# Patient Record
Sex: Male | Born: 1955 | Race: White | Hispanic: No | Marital: Single | Smoking: Never smoker
Health system: Southern US, Community
[De-identification: ages and names within clinical notes are randomized; demographics above are authoritative.]

## PROBLEM LIST (undated history)

## (undated) DIAGNOSIS — I10 Essential (primary) hypertension: Secondary | ICD-10-CM

## (undated) DIAGNOSIS — F79 Unspecified intellectual disabilities: Secondary | ICD-10-CM

## (undated) DIAGNOSIS — R569 Unspecified convulsions: Secondary | ICD-10-CM

---

## 2003-11-08 ENCOUNTER — Emergency Department: Payer: Self-pay | Admitting: Unknown Physician Specialty

## 2008-08-28 ENCOUNTER — Emergency Department: Payer: Self-pay | Admitting: Emergency Medicine

## 2011-07-24 ENCOUNTER — Emergency Department: Payer: Self-pay | Admitting: Emergency Medicine

## 2013-09-07 DIAGNOSIS — G40409 Other generalized epilepsy and epileptic syndromes, not intractable, without status epilepticus: Secondary | ICD-10-CM | POA: Diagnosis present

## 2013-09-07 DIAGNOSIS — I10 Essential (primary) hypertension: Secondary | ICD-10-CM | POA: Diagnosis present

## 2015-10-12 ENCOUNTER — Other Ambulatory Visit
Admission: RE | Admit: 2015-10-12 | Discharge: 2015-10-12 | Disposition: A | Discharge status: Home / Self Care | Payer: Medicare Other | Source: Ambulatory Visit | Attending: Physician Assistant | Admitting: Physician Assistant

## 2015-10-12 ENCOUNTER — Emergency Department
Admission: EM | Admit: 2015-10-12 | Discharge: 2015-10-12 | Disposition: A | Discharge status: Home / Self Care | Payer: Medicare Other | Attending: Emergency Medicine | Admitting: Emergency Medicine

## 2015-10-12 DIAGNOSIS — I10 Essential (primary) hypertension: Secondary | ICD-10-CM | POA: Diagnosis not present

## 2015-10-12 DIAGNOSIS — R079 Chest pain, unspecified: Secondary | ICD-10-CM | POA: Diagnosis present

## 2015-10-12 DIAGNOSIS — K219 Gastro-esophageal reflux disease without esophagitis: Secondary | ICD-10-CM

## 2015-10-12 DIAGNOSIS — R0789 Other chest pain: Secondary | ICD-10-CM | POA: Diagnosis present

## 2015-10-12 LAB — BASIC METABOLIC PANEL
Anion gap: 8 (ref 5–15)
BUN: 11 mg/dL (ref 6–20)
CALCIUM: 8.8 mg/dL — AB (ref 8.9–10.3)
CO2: 25 mmol/L (ref 22–32)
CREATININE: 0.7 mg/dL (ref 0.61–1.24)
Chloride: 100 mmol/L — ABNORMAL LOW (ref 101–111)
GFR calc Af Amer: >60 mL/min (ref >60)
GFR calc non Af Amer: >60 mL/min (ref >60)
GLUCOSE: 133 mg/dL — AB (ref 65–99)
Potassium: 3.5 mmol/L (ref 3.5–5.1)
Sodium: 133 mmol/L — ABNORMAL LOW (ref 135–145)

## 2015-10-12 LAB — CBC
HCT: 45 % (ref 40.0–52.0)
Hemoglobin: 15.8 g/dL (ref 13.0–18.0)
MCH: 32.4 pg (ref 26.0–34.0)
MCHC: 35.1 g/dL (ref 32.0–36.0)
MCV: 92.3 fL (ref 80.0–100.0)
PLATELETS: 270 10*3/uL (ref 150–440)
RBC: 4.87 MIL/uL (ref 4.40–5.90)
RDW: 12.8 % (ref 11.5–14.5)
WBC: 6.5 10*3/uL (ref 3.8–10.6)

## 2015-10-12 LAB — TROPONIN I: Troponin I: 0.03 ng/mL (ref <0.03)

## 2015-10-12 LAB — FIBRIN DERIVATIVES D-DIMER (ARMC ONLY): FIBRIN DERIVATIVES D-DIMER (ARMC): 244 (ref 0–499)

## 2015-10-12 NOTE — ED Provider Notes (Signed)
[facility] Emergency Department Provider Note ____________________________________________   I have reviewed the triage vital signs and the triage nursing note.  HISTORY  Chief Complaint Chest Pain   Historian Patient  HPI Ryan Mayer is a 60 y.o. male with a history of mental retardation, who the group home has brought to the primary care doctor due to hypertension over the past week or so. Initially he was placed on amlodipine, by the group home noted that his blood pressure was still running high and brought him to primary care for follow-up today. It sounds like he has over the past couple of weeks. To his chest as if he may be in discomfort. Today the primary care provider recommended adding metoprolol for heart rate elevated into the 120s although he was somewhat agitated per the group home staff, and recommended some laboratory studies given his indication that he might be having some chest discomfort. After he went home, they were called that the troponin was elevated at 0.03. He was instructed to be brought to the emergency room for further evaluation.    No past medical history on file.   . Gingivitis  . Hives  . Hyperlipidemia, unspecified  . Hypertension  . Mental retardation  . Microcephaly (CMS-HCC)  . Rhinitis  . Seizures (CMS-HCC)    There are no active problems to display for this patient.   No past surgical history on file.  Prior to Admission medications   Not on File    copied and pasted from patient's office visit today  Medication Sig Taking? Last Dose  amLODIPine (NORVASC) 5 MG tablet Take 1 tablet (5 mg total) by mouth once daily. Yes Taking  carBAMazepine (TEGRETOL) 200 mg tablet Take 200 mg by mouth 3 (three) times daily. Yes Taking  cetirizine (ZYRTEC) 10 MG tablet Take 1 tablet (10 mg total) by mouth 2 (two) times daily. Yes Taking  DENTAGEL 1.1 % gel APPLY TO TEETH WITH SWAB IN THE MORNING AFTER BRUSHING TEETH Yes Taking   dextromethorphan-guaifenesin (ROBITUSSIN-DM) 10-100 mg/5 mL liquid Take 5 mLs by mouth every 4 (four) hours as needed for Cough. Yes Taking  diphenhydrAMINE (BENADRYL) 25 mg capsule TAKE 1 CAPSULE BY MOUTH EVERY 6 HOURS ASNEEDED FOR ITCHING Yes Taking  docusate (COLACE) 100 MG capsule TAKE 1 CAPSULE BY MOUTH 3 TIMES DAILY ASNEEDED FOR STOOL SOFTENER Yes Taking  DULCOLAX, BISACODYL, ORAL Take 10 mg by mouth every 12 (twelve) hours as needed. Yes Taking  EAR DROPS, CARBAMIDE PEROXIDE, 6.5 % otic solution PLACE 5-10 DROPS INTO AFFECTED EAR(S) ONCE WEEKLY TO CONTROL EAR WAX Yes Taking  levocetirizine (XYZAL) 5 MG tablet Take 5 mg by mouth every evening. Yes Taking  lisinopril (PRINIVIL,ZESTRIL) 20 MG tablet TAKE 1 TABLET BY MOUTH ONCE DAILY, IN ADDITION TO LISIN/HCTZ 20/12.5 MG Yes Taking  lisinopril-hydrochlorothiazide (PRINZIDE,ZESTORETIC) 20-12.5 mg tablet TAKE 1 TABLET BY MOUTH ONCE DAILY FOR BLOOD PRESSURE Yes Taking  magnesium hydroxide (MILK OF MAGNESIA) 400 mg/5 mL suspension Take 60 mLs by mouth as directed for Constipation (every Friday). Yes Taking  metroNIDAZOLE (METROCREAM) 0.75 % cream Apply topically 2 (two) times daily. Yes Taking  metroNIDAZOLE (METROGEL) 0.75 % gel APPLY TO FACE DAILY AT BEDTIME Yes Taking  selenium sulfide (SELSUN BLUE) 1 % Susp shampoo Apply topically as directed. Yes Taking  metoprolol succinate (TOPROL-XL) 25 MG XL tablet Take 1 tablet (25 mg total) by mouth once daily.  pantoprazole (PROTONIX) 40 MG DR tablet Take 1 tablet (40 mg total) by  mouth once daily.        Allergies not on file  No family history on file.  Social History Social History  Substance Use Topics  . Smoking status: Not on file  . Smokeless tobacco: Not on file  . Alcohol use Not on file    Review of Systems  Constitutional: Negative for Recent illnesses. Eyes: Negative for visual changes. ENT: Negative for sore throat. Cardiovascular: Questionable for chest pain,  occasionally he points to his chest and pat at it. Respiratory: Negative for shortness of breath. No cough. Gastrointestinal: Negative for abdominal pain, vomiting and diarrhea. Genitourinary: Negative for dysuria. Musculoskeletal: Negative for back pain. Skin: Negative for rash. Neurological: Negative for headache. 10 point Review of Systems otherwise negative ____________________________________________   PHYSICAL EXAM:  VITAL SIGNS: ED Triage Vitals [10/12/15 1711]  Enc Vitals Group     BP      Pulse Rate (!) 116     Resp      Temp      Temp src      SpO2 96 %     Weight      Height      Head Circumference      Peak Flow      Pain Score      Pain Loc      Pain Edu?      Excl. in GC?      Constitutional: Alert And somewhat difficult to redirect, but redirectable with his group home staff. Slightly agitated, but no acute distress. HEENT   Head: Normocephalic and atraumatic.      Eyes: Conjunctivae are normal. PERRL. Normal extraocular movements.      Ears:         Nose: No congestion/rhinnorhea.   Mouth/Throat: Mucous membranes are moist.   Neck: No stridor. Cardiovascular/Chest: Tachycardic, regular rhythm.  No murmurs, rubs, or gallops. Respiratory: Normal respiratory effort without tachypnea nor retractions. Breath sounds are clear and equal bilaterally. No wheezes/rales/rhonchi. Gastrointestinal: Soft. No distention, no guarding, no rebound. Nontender.    Genitourinary/rectal:Deferred Musculoskeletal: Nontender with normal range of motion in all extremities. No joint effusions.  No lower extremity tenderness.  No edema. Neurologic: Moving 4 extremities, no obvious focal deficits.. Skin:  Skin is warm, dry and intact. No rash noted. Psychiatric: Slightly agitated at times, but mostly redirectable.   ____________________________________________  LABS (pertinent positives/negatives)  Labs Reviewed  BASIC METABOLIC PANEL - Abnormal; Notable for the  following:       Result Value   Sodium 133 (*)    Chloride 100 (*)    Glucose, Bld 133 (*)    Calcium 8.8 (*)    All other components within normal limits  CBC  TROPONIN I    ____________________________________________    EKG I, Governor Rooks, MD, the attending physician have personally viewed and interpreted all ECGs.  Not done ____________________________________________  RADIOLOGY All Xrays were viewed by me. Imaging interpreted by Radiologist.  Not done __________________________________________  PROCEDURES  Procedure(s) performed: None  Critical Care performed: None  ____________________________________________   ED COURSE / ASSESSMENT AND PLAN  Pertinent labs & imaging results that were available during my care of the patient were reviewed by me and considered in my medical decision making (see chart for details).   Group home brought this patient in due to "elevated troponin ", when I reviewed this was 0.03. This patient due to his mental retardation and is extremely agitated at new scenarios and was flailing his arms and  yelling and trying to walk out. Currently he does not appear to be having any discomfort. It sounds like per the group home that he's been having some discomfort where he passes chest over the past week or so, felt most likely attributed GERD, and was just started today both on baby aspirin as well as PPI. He did pat his chest during the primary care office, and so I discussed with them send a repeat troponin. The blood work had already been drawn before I saw the patient, and he was extremely agitated about having IV and so this was essentially pulled.  His repeat troponin is less than 0.03. He is not allowing EKG, or x-ray, and I really don't think it's necessary at this point in time to sedate him. I think it's most likely that his symptoms have been due to gastritis/GERD.  Group home staff states he had an EKG that was nonspecific, although I  do not have access to this right now in our system. I did discuss with them that I would have typically perform an additional EKG and chest x-ray, but in this case at all thank the extra trauma to the patient or sedation is really worth the benefit here.  His heart rate was around 110, but he is certainly still agitated here. Group home staff said that they would rather just take him home. He was started today by the primary provider on metoprolol to help with his blood pressure and also the elevated heart rate as well.     CONSULTATIONS:   None   Patient / Family / Caregiver informed of clinical course, medical decision-making process, and agree with plan.   I discussed return precautions, follow-up instructions, and discharge instructions with patient and/or family.   ___________________________________________   FINAL CLINICAL IMPRESSION(S) / ED DIAGNOSES   Final diagnoses:  Nonspecific chest pain  Gastroesophageal reflux disease, esophagitis presence not specified              Note: This dictation was prepared with Dragon dictation. Any transcriptional errors that result from this process are unintentional    Governor Rooksebecca Dhwani Venkatesh, MD 10/12/15 1907

## 2015-10-12 NOTE — ED Notes (Signed)
Unable to obtain d/c VS due to pt baseline neuro

## 2015-10-12 NOTE — ED Notes (Signed)
Pt unable to sit still, pt has MR. Two caregivers are with pt at bedside. Pt given ice cream at this time, pt sitting in chair outside room. Consulting civil engineerCharge RN notified

## 2015-10-12 NOTE — Discharge Instructions (Signed)
Were evaluated for minimal elevation of heart enzyme, which upon repeat was normal.  I suspect the symptoms of chest discomfort are probably related to acid reflux also called GERD.  Please follow up with your primary care physician. Return to the emergency department for any worsening condition including weakness or numbness, trouble breathing, passing out or any other symptoms concerning to you.

## 2015-10-12 NOTE — ED Triage Notes (Signed)
[facility] after having an elevated troponin. Pt reportedly fell this morning and was reporting have centralized chest pain. Pt is not SOB at this time and denies chest pain currently.

## 2019-05-30 ENCOUNTER — Emergency Department: Payer: Medicare Other

## 2019-05-30 ENCOUNTER — Other Ambulatory Visit: Payer: Self-pay

## 2019-05-30 ENCOUNTER — Encounter: Payer: Self-pay | Admitting: *Deleted

## 2019-05-30 DIAGNOSIS — R197 Diarrhea, unspecified: Secondary | ICD-10-CM | POA: Diagnosis not present

## 2019-05-30 DIAGNOSIS — K529 Noninfective gastroenteritis and colitis, unspecified: Secondary | ICD-10-CM | POA: Diagnosis not present

## 2019-05-30 DIAGNOSIS — R112 Nausea with vomiting, unspecified: Secondary | ICD-10-CM | POA: Diagnosis present

## 2019-05-30 DIAGNOSIS — I10 Essential (primary) hypertension: Secondary | ICD-10-CM | POA: Diagnosis not present

## 2019-05-30 DIAGNOSIS — R109 Unspecified abdominal pain: Secondary | ICD-10-CM | POA: Insufficient documentation

## 2019-05-30 LAB — CBC
HCT: 39 % (ref 39.0–52.0)
Hemoglobin: 14.4 g/dL (ref 13.0–17.0)
MCH: 31.8 pg (ref 26.0–34.0)
MCHC: 36.9 g/dL — ABNORMAL HIGH (ref 30.0–36.0)
MCV: 86.1 fL (ref 80.0–100.0)
Platelets: 241 10*3/uL (ref 150–400)
RBC: 4.53 MIL/uL (ref 4.22–5.81)
RDW: 13.2 % (ref 11.5–15.5)
WBC: 9.9 10*3/uL (ref 4.0–10.5)
nRBC: 0 % (ref 0.0–0.2)

## 2019-05-30 LAB — COMPREHENSIVE METABOLIC PANEL
ALT: 18 U/L (ref 0–44)
AST: 14 U/L — ABNORMAL LOW (ref 15–41)
Albumin: 3 g/dL — ABNORMAL LOW (ref 3.5–5.0)
Alkaline Phosphatase: 113 U/L (ref 38–126)
Anion gap: 11 (ref 5–15)
BUN: 11 mg/dL (ref 8–23)
CO2: 25 mmol/L (ref 22–32)
Calcium: 7.6 mg/dL — ABNORMAL LOW (ref 8.9–10.3)
Chloride: 98 mmol/L (ref 98–111)
Creatinine, Ser: 0.55 mg/dL — ABNORMAL LOW (ref 0.61–1.24)
GFR calc Af Amer: >60 mL/min (ref >60)
GFR calc non Af Amer: >60 mL/min (ref >60)
Glucose, Bld: 110 mg/dL — ABNORMAL HIGH (ref 70–99)
Potassium: 2.8 mmol/L — ABNORMAL LOW (ref 3.5–5.1)
Sodium: 134 mmol/L — ABNORMAL LOW (ref 135–145)
Total Bilirubin: 0.5 mg/dL (ref 0.3–1.2)
Total Protein: 5.9 g/dL — ABNORMAL LOW (ref 6.5–8.1)

## 2019-05-30 LAB — LIPASE, BLOOD: Lipase: 17 U/L (ref 11–51)

## 2019-05-30 MED ORDER — SODIUM CHLORIDE 0.9% FLUSH: 3.0000 mL | Freq: Once | INTRAVENOUS | Status: DC

## 2019-05-30 NOTE — ED Triage Notes (Signed)
Pt is here from group home with caretakers.  Pt has had diarrhea since Friday (watery) and has vomited x2.  Pt has not had an appetite and has been more sleepy than usual.  Caretakers state that pt has been eating papertowels as caretaker states that he vomited 3 papertowels mixed with food last pm.  Caretaker is concerned about a bowel blockage.

## 2019-05-30 NOTE — ED Notes (Signed)
First nurse note: Pt to the er for eating paper towels. Pt has staff from group home with him. Pt is touch sensitive.

## 2019-05-30 NOTE — ED Notes (Signed)
Pt ambulatory in lobby with no distress noted, accomp by 2 caregivers who report that pt is becoming increasingly restless and they are concerned he will become agitated due to long wait; updated on wait time and informed that an abd xray has been ordered; caregivers report that pt will have to be sedated; radiology aware and will wait until pt in exam room; caregivers report that they will be outside with pt until ready for exam room

## 2019-05-31 ENCOUNTER — Emergency Department: Payer: Medicare Other

## 2019-05-31 ENCOUNTER — Emergency Department
Admission: EM | Admit: 2019-05-31 | Discharge: 2019-05-31 | Disposition: A | Discharge status: Home / Self Care | Payer: Medicare Other | Attending: Emergency Medicine | Admitting: Emergency Medicine

## 2019-05-31 DIAGNOSIS — K529 Noninfective gastroenteritis and colitis, unspecified: Secondary | ICD-10-CM | POA: Diagnosis not present

## 2019-05-31 HISTORY — DX: Essential (primary) hypertension: I10

## 2019-05-31 HISTORY — DX: Unspecified convulsions: R56.9

## 2019-05-31 HISTORY — DX: Unspecified intellectual disabilities: F79

## 2019-05-31 MED ORDER — PROPOFOL 10 MG/ML IV BOLUS
30.0000 mg | Freq: Once | INTRAVENOUS | Status: AC
  Administered 2019-05-31: 30 mg via INTRAVENOUS
  Filled 2019-05-31: qty 20

## 2019-05-31 MED ORDER — PROPOFOL 10 MG/ML IV BOLUS
30.0000 mg | Freq: Once | INTRAVENOUS | Status: AC
  Administered 2019-05-31: 30 mg via INTRAVENOUS

## 2019-05-31 MED ORDER — MIDAZOLAM HCL 2 MG/2ML IJ SOLN
2.0000 mg | Freq: Once | INTRAMUSCULAR | Status: AC
  Filled 2019-05-31: qty 2

## 2019-05-31 MED ORDER — DROPERIDOL 2.5 MG/ML IJ SOLN
5.0000 mg | Freq: Once | INTRAMUSCULAR | Status: AC
  Administered 2019-05-31: 5 mg via INTRAMUSCULAR
  Filled 2019-05-31: qty 2

## 2019-05-31 MED ORDER — IOHEXOL 300 MG/ML  SOLN
100.0000 mL | Freq: Once | INTRAMUSCULAR | Status: AC | PRN
  Administered 2019-05-31: 100 mL via INTRAVENOUS

## 2019-05-31 MED ORDER — MIDAZOLAM HCL 2 MG/2ML IJ SOLN
INTRAMUSCULAR | Status: AC
  Administered 2019-05-31: 2 mg via INTRAVENOUS
  Filled 2019-05-31: qty 2

## 2019-05-31 MED ORDER — DROPERIDOL 2.5 MG/ML IJ SOLN
2.5000 mg | Freq: Once | INTRAMUSCULAR | Status: AC | PRN
  Administered 2019-05-31: 2.5 mg via INTRAVENOUS
  Filled 2019-05-31: qty 2

## 2019-05-31 NOTE — ED Notes (Signed)
PT fighting caregivers still, still attempting to remove IV and monitors. Per York Cerise, MD and Oliver Hum (now charge RN) ok to d/c without fresh vital signs for comfort of pt and safety of staff and caregivers.

## 2019-05-31 NOTE — ED Provider Notes (Signed)
[facility] Emergency Department Provider Note  ____________________________________________   First MD Initiated Contact with Patient 05/31/19 0019     (approximate)  I have reviewed the triage vital signs and the nursing notes.   HISTORY  Chief Complaint Emesis (ate paper towels)  Level 5 caveat: The patient's history is limited by the patient's severe chronic cognitive limitations.  He has 2 group home representative/caretakers who are currently with him and providing the history.  HPI Ryan Mayer is a 64 y.o. male with severe intellectual disability, seizure disorder, hypertension, who presents with his caretakers from a group home for evaluation of nausea, vomiting, and abdominal pain.  Reportedly he has had intermittent vomiting and diarrhea for the last few days.  They noted recently when he vomited that he was vomiting up paper towels which she is apparently been eating and this is not baseline behavior for him.  He has had several episodes of diarrhea since coming to the emergency department.  He also is able to communicate to them that he is experiencing abdominal pain.  Again this is not typical for him.  He has had no fevers and no difficulty breathing.  His communicative capacity is decreased but he is asking me what is wrong with his belly.         Past Medical History:  Diagnosis Date  . Hypertension   . Intellectual disability   . Seizure ([facility])     There are no problems to display for this patient.   History reviewed. No pertinent surgical history.  Prior to Admission medications   Not on File    Allergies Patient has no known allergies.  No family history on file.  Social History Social History   Tobacco Use  . Smoking status: Never Smoker  . Smokeless tobacco: Never Used  Substance Use Topics  . Alcohol use: Never  . Drug use: Never    Review of Systems Level 5 caveat: The patient's history is limited by the  patient's severe chronic cognitive limitations.  He has 2 group home representative/caretakers who are currently with him and providing the history.  Patient is having abdominal pain, nausea, vomiting, and diarrhea.    ____________________________________________   PHYSICAL EXAM:  VITAL SIGNS: ED Triage Vitals  Enc Vitals Group     BP 05/30/19 1739 (!) 155/94     Pulse Rate 05/30/19 1739 84     Resp 05/30/19 1739 16     Temp 05/30/19 1739 97.8 F (36.6 C)     Temp Source 05/30/19 1739 Axillary     SpO2 05/30/19 1739 96 %     Weight 05/30/19 1740 56.7 kg (125 lb)     Height --      Head Circumference --      Peak Flow --      Pain Score 05/30/19 1740 0     Pain Loc --      Pain Edu? --      Excl. in [name]? --     Constitutional: Awake and alert.  More or less at his baseline according to caregivers except for complaints of his abdominal pain and his repeated episodes of vomiting and diarrhea. Eyes: Conjunctivae are normal.  Head: Atraumatic. Nose: No congestion/rhinnorhea. Mouth/Throat: Patient is wearing a mask. Neck: No stridor.  No meningeal signs.   Cardiovascular: Normal rate, regular rhythm. Good peripheral circulation. Grossly normal heart sounds. Respiratory: Normal respiratory effort.  No retractions. Gastrointestinal: Soft and mildly distended.  It  seems that he is having some tenderness to palpation. Musculoskeletal: No lower extremity tenderness nor edema. No gross deformities of extremities. Neurologic: Ambulatory, moving all 4 extremities, limited speech and cognition at baseline. Skin:  Skin is warm, dry and intact. Psychiatric: Mood and affect are abnormal but more or less at his baseline.  He does not tolerate lying down in a bed and will accept intramuscular injections and IV placement but cannot tolerate having anything strapped to him (including an IV that has been placed, EKG leads, etc.).  ____________________________________________   LABS (all labs  ordered are listed, but only abnormal results are displayed)  Labs Reviewed  COMPREHENSIVE METABOLIC PANEL - Abnormal; Notable for the following components:      Result Value   Sodium 134 (*)    Potassium 2.8 (*)    Glucose, Bld 110 (*)    Creatinine, Ser 0.55 (*)    Calcium 7.6 (*)    Total Protein 5.9 (*)    Albumin 3.0 (*)    AST 14 (*)    All other components within normal limits  CBC - Abnormal; Notable for the following components:   MCHC 36.9 (*)    All other components within normal limits  LIPASE, BLOOD  URINALYSIS, COMPLETE (UACMP) WITH MICROSCOPIC   ____________________________________________  EKG  Unable to obtain EKG secondary to the patient's intellectual disability and combativeness. ____________________________________________  RADIOLOGY Marylou Mccoy, personally viewed and evaluated these images (plain radiographs) as part of my medical decision making, as well as reviewing the written report by the radiologist.  ED MD interpretation: Subacute trauma to the left buttock, stool burden in colon, otherwise unremarkable  Official radiology report(s): CT ABDOMEN PELVIS W CONTRAST  Result Date: 05/31/2019 CLINICAL DATA:  Eating paper towels.  Vomiting. EXAM: CT ABDOMEN AND PELVIS WITH CONTRAST TECHNIQUE: Multidetector CT imaging of the abdomen and pelvis was performed using the standard protocol following bolus administration of intravenous contrast. CONTRAST:  OMNIPAQUE IOHEXOL 300 MG/ML  SOLN COMPARISON:  None. FINDINGS: Lower chest:  Coronary atherosclerosis. Hepatobiliary: No focal liver abnormality.Cholelithiasis. No evidence of acute cholecystitis. Pancreas: Unremarkable. Spleen: Unremarkable. Adrenals/Urinary Tract: Negative adrenals. No hydronephrosis or stone. Subcentimeter left renal cystic density. Unremarkable bladder. Stomach/Bowel: No obstruction. No appendicitis. Formed stool distends the colon. Vascular/Lymphatic: No acute vascular abnormality.  No mass or adenopathy. Reproductive:Mild enlargement of the central prostate projecting into the bladder base. Retracted right testicle into the inguinal canal. Other: No ascites or pneumoperitoneum. Musculoskeletal: No acute abnormalities. Lumbar spine degeneration and dextroscoliosis. Enhancing fascicle of left gluteus major without swelling, possibly subacute trauma. IMPRESSION: 1. Negative for bowel obstruction. 2. Diffuse colonic distention by stool. 3. Cholelithiasis without findings of cholecystitis. 4. Wedge of enhancement in the left gluteus major, question subacute trauma. Electronically Signed   By: Marnee Spring M.D.   On: 05/31/2019 04:22    ____________________________________________   PROCEDURES   Procedure(s) performed (including Critical Care):  .Sedation  Date/Time: 05/31/2019 3:23 AM Performed by: Loleta Rose, MD Authorized by: Loleta Rose, MD   Consent:    Consent obtained:  Emergent situation and verbal (electronic informed consent)   Consent given by:  Healthcare agent   Risks discussed:  Allergic reaction, dysrhythmia, inadequate sedation, nausea, vomiting, respiratory compromise necessitating ventilatory assistance and intubation, prolonged sedation necessitating reversal and prolonged hypoxia resulting in organ damage   Alternatives discussed:  Analgesia without sedation and anxiolysis Universal protocol:    Procedure explained and questions answered to patient or proxy's satisfaction: yes  Relevant documents present and verified: yes     Test results available and properly labeled: yes     Imaging studies available: yes     Required blood products, implants, devices, and special equipment available: yes     Immediately prior to procedure a time out was called: yes     Patient identity confirmation method:  Arm band Indications:    Procedure performed:  Imaging studies   Procedure necessitating sedation performed by:  Physician performing  sedation Pre-sedation assessment:    Time since last food or drink:  Hours   NPO status caution: urgency dictates proceeding with non-ideal NPO status     ASA classification: class 2 - patient with mild systemic disease     Mallampati score:  II - soft palate, uvula, fauces visible   Pre-sedation assessments completed and reviewed: airway patency, cardiovascular function, hydration status, mental status, nausea/vomiting, pain level, respiratory function and temperature     Pre-sedation assessment completed:  05/31/2019 3:24 AM Immediate pre-procedure details:    Reassessment: Patient reassessed immediately prior to procedure     Reviewed: vital signs, relevant labs/tests and NPO status     Verified: bag valve mask available, emergency equipment available, intubation equipment available, IV patency confirmed, oxygen available, reversal medications available and suction available   Procedure details (see MAR for exact dosages):    Preoxygenation:  Nonrebreather mask   Intended level of sedation: deep   Analgesia:  None   Intra-procedure monitoring:  Blood pressure monitoring, continuous pulse oximetry, cardiac monitor, frequent vital sign checks and frequent LOC assessments   Intra-procedure events: hypoxia     Intra-procedure events comment:  Brief when not on NRB, resolved immediately with oxygen   Intra-procedure management:  Supplemental oxygen   Total Provider sedation time (minutes):  15 Post-procedure details:    Post-sedation assessment completed:  05/31/2019 4:45 AM   Attendance: Constant attendance by certified staff until patient recovered     Recovery: Patient returned to pre-procedure baseline     Post-sedation assessments completed and reviewed: airway patency, cardiovascular function, hydration status, mental status, nausea/vomiting, pain level and respiratory function     Patient is stable for discharge or admission: yes     Patient tolerance:  Tolerated well, no immediate  complications     ____________________________________________   INITIAL IMPRESSION / MDM / ASSESSMENT AND PLAN / ED COURSE  As part of my medical decision making, I reviewed the following data within the electronic MEDICAL RECORD NUMBER History obtained from family, Nursing notes reviewed and incorporated, Labs reviewed , Old chart reviewed and Notes from prior ED visits   Differential diagnosis includes, but is not limited to, viral gastroenteritis, SBO/ileus, foreign body ingestion, diverticulitis, appendicitis, biliary colic.  The patient's lab work is generally reassuring with no leukocytosis.  He has hypokalemia likely secondary to his GI losses.  No LFT elevation.  Normal renal function.  I had a long discussion with his caregivers who obviously know him well and care for him and are able to communicate well with him.  I explained the risks particularly for a complicated patient and the difficulty in predicting how he will respond to various methods of sedation or calming agents.  Given his anxiety over lying flat for any kind of procedure he will need calming agents and or sedation but I explained that I am very concerned about providing procedural sedation for his CT scan.  The current plan is to administer droperidol 2.5 mg intramuscular and see if  this will adequately calm him for IV placement in the CT scan.  He may require additional agents.  They understand the risks and agree with the plan.       Clinical Course as of May 31 446  Fri May 31, 2019  0240 In spite of the droperidol administration, the patient is a little bit sleepy but still agitated and unwilling to lie down flat.  He tolerated the placement of peripheral IV but then became very anxious and tried to rip out the IV.  I ordered Versed 2 mg IV as a calming agent and have also ordered another droperidol 2.5 mg IV to work in conjunction with the benzodiazepine to attempt to adequately calm the patient to allow not only  for the continuation of the peripheral IV but to allow for a CT scan.   [CF]  0301 The patient remained highly agitated even after 2 mg of Versed IV and another droperidol 2.5 mg IV.  He had required 4 people to hold him down.  I had a quick conversation with his caregivers who are also in contact with his legal guardian by phone.  They agreed to emergent procedural sedation to obtain the imaging.  See sedation documentation for details, but in short he required 3 separate doses of propofol 30 mg IV (approximately 0.5 mg/kg each dose) but we were able to obtain CT scans.  He had a brief episode of hypoxemia which was addressed easily with supplemental oxygen and no other complications.  He is already nearly back at his baseline, breathing comfortably and not requiring supplemental oxygen.  Awaiting the results of the imaging.   [CF]  [phone] Patient is very much back at his baseline requiring constant redirection and physical restraint by his caregivers (not ED staff) to keep him from pulling out his IV.  His CT scan showed no acute abnormalities.  He has a subacute injury to his left buttock (likely resolving hematoma) and I told caregivers about that they said that a few days ago he did fall onto his left buttock.  But there is no sign of obstruction or acute intra-abdominal infection.  They are comfortable at this point taking him home and following up as an outpatient.  I gave my usual and customary gastroenteritis recommendations.  CT ABDOMEN PELVIS W CONTRAST [CF]  0448 Of note, the patient is tachycardic at the time of discharge but he is agitated because he wants to go home and because he has had an IV in   [CF]    Clinical Course User Index [CF] Loleta Rose, MD     ____________________________________________  FINAL CLINICAL IMPRESSION(S) / ED DIAGNOSES  Final diagnoses:  Gastroenteritis     MEDICATIONS GIVEN DURING THIS VISIT:  Medications  sodium chloride flush (NS) 0.9 %  injection 3 mL (has no administration in time range)  droperidol (INAPSINE) 2.5 MG/ML injection 5 mg (5 mg Intramuscular Given 05/31/19 0111)  midazolam (VERSED) injection 2 mg (2 mg Intravenous Given 05/31/19 0259)  droperidol (INAPSINE) 2.5 MG/ML injection 2.5 mg (2.5 mg Intravenous Given by Other 05/31/19 0259)  iohexol (OMNIPAQUE) 300 MG/ML solution 100 mL (100 mLs Intravenous Contrast Given 05/31/19 0334)  propofol (DIPRIVAN) 10 mg/mL bolus/IV push 30 mg (30 mg Intravenous Given 05/31/19 0331)     ED Discharge Orders    None      *Please note:  Ryan Mayer was evaluated in Emergency Department on 05/31/2019 for the symptoms described in the history of present illness.  He was evaluated in the context of the global COVID-19 pandemic, which necessitated consideration that the patient might be at risk for infection with the SARS-CoV-2 virus that causes COVID-19. Institutional protocols and algorithms that pertain to the evaluation of patients at risk for COVID-19 are in a state of rapid change based on information released by regulatory bodies including the CDC and federal and state organizations. These policies and algorithms were followed during the patient's care in the ED.  Some ED evaluations and interventions may be delayed as a result of limited staffing during the pandemic.*  Note:  This document was prepared using Dragon voice recognition software and may include unintentional dictation errors.   Loleta Rose, MD 05/31/19 [id number]

## 2019-05-31 NOTE — ED Notes (Signed)
Dr. York Cerise, this RN and Jeannett Senior RN to accompany pt to CT

## 2019-05-31 NOTE — ED Notes (Signed)
Started 20g R AC to admin IV meds, IV start went smoothly but once IV taped down and meds being administered pt became anxious and aggressive and ripped out IV.  Bleeding controlled.

## 2019-05-31 NOTE — Discharge Instructions (Addendum)

## 2019-05-31 NOTE — ED Notes (Signed)
PT assisted into bed by staff and caregivers. PT held by multiple staff members n order to start IV and give meds. Initial IV in R hand did not work. IV successfully started in R forearm. PT fighting and resisting the whole time, even post medication.  Mitts applied to pt. PT attempts to bite mitts off. Staff remains with pt and will go with pt to CT.  Event supervised for pt and staff safety by Raquel, Consulting civil engineer.

## 2019-09-30 ENCOUNTER — Other Ambulatory Visit: Payer: Self-pay

## 2019-09-30 ENCOUNTER — Emergency Department: Payer: Medicare Other

## 2019-09-30 ENCOUNTER — Inpatient Hospital Stay
Admission: EM | Admit: 2019-09-30 | Discharge: 2019-10-03 | DRG: 392 | Disposition: A | Discharge status: Home / Self Care | Payer: Medicare Other | Attending: Internal Medicine | Admitting: Internal Medicine

## 2019-09-30 DIAGNOSIS — E440 Moderate protein-calorie malnutrition: Secondary | ICD-10-CM | POA: Diagnosis present

## 2019-09-30 DIAGNOSIS — F72 Severe intellectual disabilities: Secondary | ICD-10-CM | POA: Diagnosis present

## 2019-09-30 DIAGNOSIS — F5089 Other specified eating disorder: Secondary | ICD-10-CM

## 2019-09-30 DIAGNOSIS — N179 Acute kidney failure, unspecified: Secondary | ICD-10-CM

## 2019-09-30 DIAGNOSIS — F79 Unspecified intellectual disabilities: Secondary | ICD-10-CM

## 2019-09-30 DIAGNOSIS — B9689 Other specified bacterial agents as the cause of diseases classified elsewhere: Secondary | ICD-10-CM | POA: Diagnosis not present

## 2019-09-30 DIAGNOSIS — E876 Hypokalemia: Secondary | ICD-10-CM | POA: Diagnosis not present

## 2019-09-30 DIAGNOSIS — K529 Noninfective gastroenteritis and colitis, unspecified: Secondary | ICD-10-CM | POA: Diagnosis present

## 2019-09-30 DIAGNOSIS — N390 Urinary tract infection, site not specified: Secondary | ICD-10-CM | POA: Diagnosis present

## 2019-09-30 DIAGNOSIS — Z6825 Body mass index (BMI) 25.0-25.9, adult: Secondary | ICD-10-CM

## 2019-09-30 DIAGNOSIS — Z87440 Personal history of urinary (tract) infections: Secondary | ICD-10-CM

## 2019-09-30 DIAGNOSIS — B961 Klebsiella pneumoniae [K. pneumoniae] as the cause of diseases classified elsewhere: Secondary | ICD-10-CM | POA: Diagnosis present

## 2019-09-30 DIAGNOSIS — K5909 Other constipation: Secondary | ICD-10-CM | POA: Diagnosis present

## 2019-09-30 DIAGNOSIS — K8020 Calculus of gallbladder without cholecystitis without obstruction: Secondary | ICD-10-CM | POA: Diagnosis present

## 2019-09-30 DIAGNOSIS — Z79899 Other long term (current) drug therapy: Secondary | ICD-10-CM | POA: Diagnosis not present

## 2019-09-30 DIAGNOSIS — Z7982 Long term (current) use of aspirin: Secondary | ICD-10-CM

## 2019-09-30 DIAGNOSIS — K921 Melena: Secondary | ICD-10-CM | POA: Diagnosis not present

## 2019-09-30 DIAGNOSIS — R197 Diarrhea, unspecified: Secondary | ICD-10-CM

## 2019-09-30 DIAGNOSIS — Z20822 Contact with and (suspected) exposure to covid-19: Secondary | ICD-10-CM | POA: Diagnosis present

## 2019-09-30 DIAGNOSIS — G40409 Other generalized epilepsy and epileptic syndromes, not intractable, without status epilepticus: Secondary | ICD-10-CM | POA: Diagnosis present

## 2019-09-30 DIAGNOSIS — T189XXA Foreign body of alimentary tract, part unspecified, initial encounter: Secondary | ICD-10-CM

## 2019-09-30 DIAGNOSIS — I10 Essential (primary) hypertension: Secondary | ICD-10-CM | POA: Diagnosis present

## 2019-09-30 DIAGNOSIS — E860 Dehydration: Secondary | ICD-10-CM | POA: Diagnosis present

## 2019-09-30 DIAGNOSIS — K625 Hemorrhage of anus and rectum: Secondary | ICD-10-CM

## 2019-09-30 DIAGNOSIS — I959 Hypotension, unspecified: Secondary | ICD-10-CM | POA: Diagnosis present

## 2019-09-30 LAB — COMPREHENSIVE METABOLIC PANEL
ALT: 20 U/L (ref 0–44)
AST: 22 U/L (ref 15–41)
Albumin: 4.1 g/dL (ref 3.5–5.0)
Alkaline Phosphatase: 91 U/L (ref 38–126)
Anion gap: 11 (ref 5–15)
BUN: 18 mg/dL (ref 8–23)
CO2: 27 mmol/L (ref 22–32)
Calcium: 9.3 mg/dL (ref 8.9–10.3)
Chloride: 95 mmol/L — ABNORMAL LOW (ref 98–111)
Creatinine, Ser: 1.46 mg/dL — ABNORMAL HIGH (ref 0.61–1.24)
GFR calc Af Amer: 59 mL/min — ABNORMAL LOW (ref >60)
GFR calc non Af Amer: 51 mL/min — ABNORMAL LOW (ref >60)
Glucose, Bld: 159 mg/dL — ABNORMAL HIGH (ref 70–99)
Potassium: 4.4 mmol/L (ref 3.5–5.1)
Sodium: 133 mmol/L — ABNORMAL LOW (ref 135–145)
Total Bilirubin: 0.9 mg/dL (ref 0.3–1.2)
Total Protein: 7.6 g/dL (ref 6.5–8.1)

## 2019-09-30 LAB — CBC
HCT: 45.9 % (ref 39.0–52.0)
Hemoglobin: 16.9 g/dL (ref 13.0–17.0)
MCH: 32 pg (ref 26.0–34.0)
MCHC: 36.8 g/dL — ABNORMAL HIGH (ref 30.0–36.0)
MCV: 86.9 fL (ref 80.0–100.0)
Platelets: 271 10*3/uL (ref 150–400)
RBC: 5.28 MIL/uL (ref 4.22–5.81)
RDW: 13 % (ref 11.5–15.5)
WBC: 19.2 10*3/uL — ABNORMAL HIGH (ref 4.0–10.5)
nRBC: 0 % (ref 0.0–0.2)

## 2019-09-30 LAB — LIPASE, BLOOD: Lipase: 27 U/L (ref 11–51)

## 2019-09-30 LAB — URINALYSIS, COMPLETE (UACMP) WITH MICROSCOPIC
Glucose, UA: NEGATIVE mg/dL
Hgb urine dipstick: NEGATIVE
Ketones, ur: 5 mg/dL — AB
Nitrite: NEGATIVE
Protein, ur: 100 mg/dL — AB
Specific Gravity, Urine: 1.03 (ref 1.005–1.030)
WBC, UA: >50 WBC/hpf — ABNORMAL HIGH (ref 0–5)
pH: 5 (ref 5.0–8.0)

## 2019-09-30 LAB — ACETAMINOPHEN LEVEL: Acetaminophen (Tylenol), Serum: <10 ug/mL — ABNORMAL LOW (ref 10–30)

## 2019-09-30 LAB — SARS CORONAVIRUS 2 BY RT PCR (HOSPITAL ORDER, PERFORMED IN ~~LOC~~ HOSPITAL LAB): SARS Coronavirus 2: NEGATIVE

## 2019-09-30 LAB — LACTIC ACID, PLASMA: Lactic Acid, Venous: 3 mmol/L (ref 0.5–1.9)

## 2019-09-30 LAB — SALICYLATE LEVEL: Salicylate Lvl: <7 mg/dL — ABNORMAL LOW (ref 7.0–30.0)

## 2019-09-30 MED ORDER — ONDANSETRON HCL 4 MG/2ML IJ SOLN: 4.0000 mg | Freq: Four times a day (QID) | INTRAMUSCULAR | Status: DC | PRN

## 2019-09-30 MED ORDER — ONDANSETRON HCL 4 MG PO TABS: 4.0000 mg | ORAL_TABLET | Freq: Four times a day (QID) | ORAL | Status: DC | PRN

## 2019-09-30 MED ORDER — SODIUM CHLORIDE 0.9 % IV SOLN: INTRAVENOUS | Status: DC

## 2019-09-30 MED ORDER — SODIUM CHLORIDE 0.9 % IV BOLUS
1000.0000 mL | Freq: Once | INTRAVENOUS | Status: AC
  Administered 2019-09-30: 1000 mL via INTRAVENOUS

## 2019-09-30 MED ORDER — METRONIDAZOLE IN NACL 5-0.79 MG/ML-% IV SOLN
500.0000 mg | Freq: Three times a day (TID) | INTRAVENOUS | Status: DC
  Administered 2019-10-01: 500 mg via INTRAVENOUS
  Filled 2019-09-30 (×5): qty 100

## 2019-09-30 MED ORDER — SODIUM CHLORIDE 0.9 % IV SOLN
1.0000 g | Freq: Once | INTRAVENOUS | Status: AC
  Administered 2019-09-30: 1 g via INTRAVENOUS
  Filled 2019-09-30: qty 10

## 2019-09-30 MED ORDER — MORPHINE SULFATE (PF) 2 MG/ML IV SOLN: 2.0000 mg | INTRAVENOUS | Status: DC | PRN

## 2019-09-30 MED ORDER — RISPERIDONE 1 MG PO TABS
0.5000 mg | ORAL_TABLET | Freq: Every day | ORAL | Status: DC
  Administered 2019-10-01 – 2019-10-03 (×3): 0.5 mg via ORAL
  Filled 2019-09-30 (×3): qty 0.5

## 2019-09-30 MED ORDER — ESCITALOPRAM OXALATE 10 MG PO TABS
10.0000 mg | ORAL_TABLET | Freq: Every day | ORAL | Status: DC
  Administered 2019-10-01 – 2019-10-03 (×3): 10 mg via ORAL
  Filled 2019-09-30 (×3): qty 1

## 2019-09-30 MED ORDER — SODIUM CHLORIDE 0.9 % IV SOLN: 2.0000 g | INTRAVENOUS | Status: DC

## 2019-09-30 MED ORDER — LORAZEPAM 2 MG/ML IJ SOLN: 4.0000 mg | Freq: Once | INTRAMUSCULAR | Status: AC

## 2019-09-30 MED ORDER — SODIUM CHLORIDE 0.9 % IV SOLN
2.0000 g | INTRAVENOUS | Status: DC
  Administered 2019-10-01: 2 g via INTRAVENOUS
  Filled 2019-09-30: qty 20
  Filled 2019-09-30: qty 2

## 2019-09-30 MED ORDER — TRAZODONE HCL 50 MG PO TABS
50.0000 mg | ORAL_TABLET | Freq: Three times a day (TID) | ORAL | Status: DC
  Administered 2019-10-01 – 2019-10-02 (×4): 50 mg via ORAL
  Filled 2019-09-30 (×4): qty 1

## 2019-09-30 MED ORDER — IOHEXOL 300 MG/ML  SOLN
100.0000 mL | Freq: Once | INTRAMUSCULAR | Status: AC | PRN
  Administered 2019-09-30: 100 mL via INTRAVENOUS

## 2019-09-30 MED ORDER — LORAZEPAM 2 MG/ML IJ SOLN
INTRAMUSCULAR | Status: AC
  Administered 2019-09-30: 4 mg via INTRAVENOUS
  Filled 2019-09-30: qty 2

## 2019-09-30 MED ORDER — METRONIDAZOLE IN NACL 5-0.79 MG/ML-% IV SOLN
500.0000 mg | Freq: Once | INTRAVENOUS | Status: AC
  Administered 2019-09-30: 500 mg via INTRAVENOUS
  Filled 2019-09-30: qty 100

## 2019-09-30 MED ORDER — ACETAMINOPHEN 650 MG RE SUPP: 650.0000 mg | Freq: Four times a day (QID) | RECTAL | Status: DC | PRN

## 2019-09-30 MED ORDER — TRAZODONE HCL 50 MG PO TABS
50.0000 mg | ORAL_TABLET | Freq: Every day | ORAL | Status: DC
  Administered 2019-10-01: 50 mg via ORAL
  Filled 2019-09-30: qty 1

## 2019-09-30 MED ORDER — ACETAMINOPHEN 325 MG PO TABS: 650.0000 mg | ORAL_TABLET | Freq: Four times a day (QID) | ORAL | Status: DC | PRN

## 2019-09-30 MED ORDER — LORAZEPAM 2 MG/ML IJ SOLN: 0.5000 mg | INTRAMUSCULAR | Status: DC | PRN

## 2019-09-30 MED ORDER — CARBAMAZEPINE 200 MG PO TABS
200.0000 mg | ORAL_TABLET | Freq: Three times a day (TID) | ORAL | Status: DC
  Administered 2019-09-30 – 2019-10-03 (×8): 200 mg via ORAL
  Filled 2019-09-30 (×10): qty 1

## 2019-09-30 MED ORDER — DIPHENHYDRAMINE HCL 50 MG/ML IJ SOLN
50.0000 mg | Freq: Once | INTRAMUSCULAR | Status: AC
  Administered 2019-09-30: 50 mg via INTRAMUSCULAR
  Filled 2019-09-30: qty 1

## 2019-09-30 MED ORDER — HALOPERIDOL LACTATE 5 MG/ML IJ SOLN
5.0000 mg | Freq: Once | INTRAMUSCULAR | Status: AC
  Administered 2019-09-30: 5 mg via INTRAMUSCULAR
  Filled 2019-09-30: qty 1

## 2019-09-30 MED ORDER — RISPERIDONE 1 MG PO TABS
1.0000 mg | ORAL_TABLET | Freq: Every day | ORAL | Status: DC
  Administered 2019-09-30 – 2019-10-02 (×3): 1 mg via ORAL
  Filled 2019-09-30 (×4): qty 1

## 2019-09-30 NOTE — ED Provider Notes (Signed)
[facility] Emergency Department Provider Note   ____________________________________________   First MD Initiated Contact with Patient 09/30/19 1626     (approximate)  I have reviewed the triage vital signs and the nursing notes.   HISTORY  Chief Complaint Swallowed Foreign Body and Diarrhea    HPI Ryan Mayer is a 64 y.o. male with a past medical history of hypertension, IDD, and generalized tonic-clonic seizures who presents with his caregivers concerning for patient having swallowed paper prior to arrival as well as patient having diarrhea that began today and is now turned bloody.  Caregivers state that patient eats paper often and they have no concerns for this they are only concerned about his diarrhea that has now turned bloody.  Patient unable to participate in history or review of systems         Past Medical History:  Diagnosis Date  . Hypertension   . Intellectual disability   . Seizure [facility])     Patient Active Problem List   Diagnosis Date Noted  . Colitis with rectal bleeding 09/30/2019  . UTI (urinary tract infection) 09/30/2019  . AKI (acute kidney injury) (HCC) 09/30/2019  . Intellectual disability 09/30/2019  . Pica 09/30/2019  . Foreign body ingestion 09/30/2019  . HTN (hypertension), benign 09/07/2013  . 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 No pertinent surgical history.  Prior to Admission medications   Not on File    Allergies Patient has no known allergies.  No family history on file.  Social History Social History   Tobacco Use  . Smoking status: Never Smoker  . Smokeless tobacco: Never Used  Substance Use Topics  . Alcohol use: Never  . Drug use: Never    Review of Systems Unable to assess secondary to mental status   ____________________________________________   PHYSICAL EXAM:  VITAL SIGNS: ED Triage Vitals  Enc Vitals  Group     BP 09/30/19 1313 97/69     Pulse Rate 09/30/19 1313 78     Resp 09/30/19 1313 16     Temp --      Temp Source 09/30/19 1313 Oral     SpO2 09/30/19 1313 95 %     Weight 09/30/19 1315 135 lb (61.2 kg)     Height 09/30/19 1315 5' (1.524 m)     Head Circumference --      Peak Flow --      Pain Score --      Pain Loc --      Pain Edu? --      Excl. in GC? --    Constitutional: Awake and alert.  Well appearing and in no acute distress. Eyes: Conjunctivae are normal. PERRL.  Head: Atraumatic. Nose: No congestion/rhinnorhea. Mouth/Throat: Mucous membranes are moist. Neck: No stridor Cardiovascular: Normal rate, regular rhythm. Grossly normal heart sounds.  Good peripheral circulation. Respiratory: Normal respiratory effort.  No retractions. Gastrointestinal: Soft and no guarding. No distention. Musculoskeletal: No lower extremity tenderness nor edema.  No joint effusions. Neurologic:  No gross focal neurologic deficits are appreciated. Skin:  Skin is warm and dry. No rash noted. Psychiatric: Agitated.  Patient speaking in mumbled sentences  ____________________________________________   LABS (all labs ordered are listed, but only abnormal results are displayed)  Labs Reviewed  COMPREHENSIVE METABOLIC PANEL - Abnormal; Notable for the following components:      Result Value   Sodium 133 (*)    Chloride  95 (*)    Glucose, Bld 159 (*)    Creatinine, Ser 1.46 (*)    GFR calc non Af Amer 51 (*)    GFR calc Af Amer 59 (*)    All other components within normal limits  CBC - Abnormal; Notable for the following components:   WBC 19.2 (*)    MCHC 36.8 (*)    All other components within normal limits  URINALYSIS, COMPLETE (UACMP) WITH MICROSCOPIC - Abnormal; Notable for the following components:   Color, Urine AMBER (*)    APPearance CLOUDY (*)    Bilirubin Urine SMALL (*)    Ketones, ur 5 (*)    Protein, ur 100 (*)    Leukocytes,Ua TRACE (*)    WBC, UA >50 (*)     Bacteria, UA FEW (*)    All other components within normal limits  ACETAMINOPHEN LEVEL - Abnormal; Notable for the following components:   Acetaminophen (Tylenol), Serum <10 (*)    All other components within normal limits  SALICYLATE LEVEL - Abnormal; Notable for the following components:   Salicylate Lvl <7.0 (*)    All other components within normal limits  SARS CORONAVIRUS 2 BY RT PCR (HOSPITAL ORDER, PERFORMED IN [facility])  LIPASE, BLOOD  LACTIC ACID, PLASMA  LACTIC ACID, PLASMA   _________________________________________  RADIOLOGY  ED MD interpretation: Patient had a single view chest x-ray and single view abdominal x-ray that did not show any evidence of radiopaque foreign bodies  Official radiology report(s): DG Chest 1 View  Result Date: 09/30/2019 CLINICAL DATA:  Reportedly swallowed foreign object EXAM: CHEST  1 VIEW COMPARISON:  None. FINDINGS: Lungs are clear. Heart size and pulmonary vascularity are normal. No radiopaque foreign body evident. There is aortic atherosclerosis. No evident adenopathy. There is left-sided acromioclavicular separation. IMPRESSION: No radiopaque foreign body.  Lungs clear.  Heart size normal. Aortic Atherosclerosis (ICD10-I70.0). Acromioclavicular separation noted on the left. Electronically Signed   By: Bretta Bang III M.D.   On: 09/30/2019 14:16   DG Abdomen 1 View  Result Date: 09/30/2019 CLINICAL DATA:  Reportedly swallowed foreign body EXAM: ABDOMEN - 1 VIEW COMPARISON:  None. FINDINGS: No radiopaque foreign body evident. No bowel dilatation or air-fluid level to suggest bowel obstruction. No free air. No abnormal calcifications. There is lumbar levoscoliosis. IMPRESSION: No evident radiopaque foreign body. No bowel obstruction or free air evident. Electronically Signed   By: Bretta Bang III M.D.   On: 09/30/2019 14:17   CT Abdomen Pelvis W Contrast  Result Date: 09/30/2019 CLINICAL DATA:  Possible foreign body  with bloody diarrhea EXAM: CT ABDOMEN AND PELVIS WITH CONTRAST TECHNIQUE: Multidetector CT imaging of the abdomen and pelvis was performed using the standard protocol following bolus administration of intravenous contrast. CONTRAST:  OMNIPAQUE IOHEXOL 300 MG/ML  SOLN COMPARISON:  05/31/2019 CT, plain film from earlier in the same day FINDINGS: Lower chest: No acute abnormality. Hepatobiliary: Liver is within normal limits. Gallbladder is well distended with dependent density similar to that seen on the prior exam consistent with small gallstones. No biliary ductal dilatation is seen. Pancreas: Unremarkable. No pancreatic ductal dilatation or surrounding inflammatory changes. Spleen: Normal in size without focal abnormality. Adrenals/Urinary Tract: The adrenal glands are within normal limits. Kidneys demonstrate a normal enhancement pattern with the exception of a few small cysts in the left kidney. Normal excretion of contrast is noted. No ureteral abnormality is seen. The bladder is decompressed with wall thickening consistent with the decompressed  state. Stomach/Bowel: Diffuse edematous changes in the rectosigmoid are noted consistent with focal colitis. The more proximal colon appears within normal limits. The appendix is un remark. No small bowel abnormality is noted. No radiopaque foreign body is seen in the bowel. Vascular/Lymphatic: Aortic atherosclerosis. No enlarged abdominal or pelvic lymph nodes. Reproductive: Prostate is unremarkable. Other: No abdominal wall hernia or abnormality. No abdominopelvic ascites. Musculoskeletal: Degenerative changes of the lumbar spine are seen. IMPRESSION: Changes consistent with colitis in the rectosigmoid. No abscess formation is noted. Cholelithiasis without complicating factors. No definitive foreign body is seen. Electronically Signed   By: Alcide Clever M.D.   On: 09/30/2019 21:11    ____________________________________________   PROCEDURES  Procedure(s)  performed (including Critical Care):  Procedures   ____________________________________________   INITIAL IMPRESSION / ASSESSMENT AND PLAN / ED COURSE        Patient presents for foreign body ingestion of paper as well as bloody diarrhea that began earlier today.  Differential diagnosis includes colitis, diverticular bleeding, upper GI bleeding, intestinal obstruction, perforated viscus  Radiologic and laboratory evaluation suggests urinary tract infection as well as colitis seen on CT.  Patient will be given Rocephin and Flagyl empirically.  Patient does not show signs of severe sepsis requiring full 30 cc/kg bolus.  Patient maintained peripheral perfusion throughout emergency department course.  I spoke to patient's caregivers who agree with plan for admission to the internal medicine service for further evaluation and management      ____________________________________________   FINAL CLINICAL IMPRESSION(S) / ED DIAGNOSES  Final diagnoses:  Diarrhea, unspecified type  Urinary tract infection without hematuria, site unspecified  Blood in stool, frank     ED Discharge Orders    None       Note:  This document was prepared using Dragon voice recognition software and may include unintentional dictation errors.   Merwyn Katos, MD 09/30/19 [phone]

## 2019-09-30 NOTE — H&P (Signed)
History and Physical    Ryan BaptiseRalph V Cogan ZOX:[id number] DOB: Aug 11, 1955 DOA: 09/30/2019  PCP: Lauro RegulusAnderson, Marshall W, MD   Patient coming from: Group home  I [facility] personally briefly [facility]  Chief Complaint: Diarrhea with blood, ingestion of foreign object  HPI: Ryan Mayer is a 64 y.o. male with medical history significant for severe intellectual disability, epilepsy with generalized tonic-clonic seizures, HTN, baseline constipation on a daily laxative, history of ingestion of paper products, with ER visit back in May 2021 for diarrhea with nonacute CT abdomen and pelvis, treated as outpatient one month prior for Klebsiella pneumonia UTI, who was brought into the emergency room with complaint of eating paper towels today, with onset of diarrhea now with he has had no vomiting or fever and has not complained of abdominal pain.  Most of the history taken from caregivers at bedside due to patient's intellectual disability.She states that due to the changes in his routine related to Covid and death of his friends at the group home where he has lived for 30 years he has been having behavioral problems for the past two years and the pica started about six months ago.  He has also lost about 30lbs in the past year in spite of maintaining a good appetite. ED Course: On arrival, he was afebrile, not tachycardic or tachypneic but hypotensive with blood pressure of 97/69, compared to baseline on last doctor's visit of 134/80.  O2 sat 95% on room air.  Blood work significant for WBC 19,000, creatinine 1.46.  Urinalysis with pyuria.  Liver enzymes within normal limits, lipase 27, acetaminophen and salicylate acid negative.  Covid negative.  CT abdomen and pelvis changes consistent with colitis in the rectosigmoid with no abscess formation.  Cholelithiasis without complicating factors.  No definitive foreign body seen.  Prior abdominal x-ray showed 'No evident  radiopaque foreign body. No bowel obstruction or free air evident'.  Patient started on Rocephin and Flagyl.  Hospitalist consulted for admission. EKG: Not done in the ER Review of Systems: Unable to obtain from patient due to intellectual disability   Past Medical History:  Diagnosis Date  . Hypertension   . Intellectual disability   . Seizure [facility](HCC)     History reviewed. No pertinent surgical history.   reports that he has never smoked. He has never used smokeless tobacco. He reports that he does not drink alcohol and does not use drugs.  No Known Allergies  Family history: Unable to obtain due to patient's severe intellectual disability   Prior to Admission medications   Not on File    Physical Exam: Vitals:   09/30/19 1313 09/30/19 1315 09/30/19 1713 09/30/19 2136  BP: 97/69  98/80 106/68  Pulse: 78  87 80  Resp: 16  16 16   Temp:    98.5 F (36.9 C)  TempSrc: Oral   Axillary  SpO2: 95%  96% 98%  Weight:  61.2 kg    Height:  5' (1.524 m)       Vitals:   09/30/19 1313 09/30/19 1315 09/30/19 1713 09/30/19 2136  BP: 97/69  98/80 106/68  Pulse: 78  87 80  Resp: 16  16 16   Temp:    98.5 F (36.9 C)  TempSrc: Oral   Axillary  SpO2: 95%  96% 98%  Weight:  61.2 kg    Height:  5' (1.524 m)        Constitutional:  Somnolent. Not in any apparent  distress HEENT:      Head: Normocephalic and atraumatic.         Eyes: PERLA, EOMI, Conjunctivae are normal. Sclera is non-icteric.       Mouth/Throat: Mucous membranes are moist.       Neck: Supple with no signs of meningismus. Cardiovascular: Regular rate and rhythm. No murmurs, gallops, or rubs. 2+ symmetrical distal pulses are present . No JVD. No LE edema Respiratory: Respiratory effort normal .Lungs sounds clear bilaterally. No wheezes, crackles, or rhonchi.  Gastrointestinal: Soft, non tender, and non distended with positive bowel sounds. No rebound or guarding. Genitourinary: No CVA tenderness. Musculoskeletal:  Nontender with normal range of motion in all extremities. No cyanosis, or erythema of extremities. Neurologic:  Face is symmetric. Moving all extremities. No gross focal neurologic deficits . Skin: Skin is warm, dry.  No rash or ulcers    Labs on Admission: I have personally reviewed following labs and imaging studies  CBC: Recent Labs  Lab 09/30/19 1326  WBC 19.2*  HGB 16.9  HCT 45.9  MCV 86.9  PLT 271   Basic Metabolic Panel: Recent Labs  Lab 09/30/19 1326  NA 133*  K 4.4  CL 95*  CO2 27  GLUCOSE 159*  BUN 18  CREATININE 1.46*  CALCIUM 9.3   GFR: Estimated Creatinine Clearance: 41 mL/min (A) (by C-G formula based on SCr of 1.46 mg/dL (H)). Liver Function Tests: Recent Labs  Lab 09/30/19 1326  AST 22  ALT 20  ALKPHOS 91  BILITOT 0.9  PROT 7.6  ALBUMIN 4.1   Recent Labs  Lab 09/30/19 1326  LIPASE 27   No results for input(s): AMMONIA in the last 168 hours. Coagulation Profile: No results for input(s): INR, PROTIME in the last 168 hours. Cardiac Enzymes: No results for input(s): CKTOTAL, CKMB, CKMBINDEX, TROPONINI in the last 168 hours. BNP (last 3 results) No results for input(s): PROBNP in the last 8760 hours. HbA1C: No results for input(s): HGBA1C in the last 72 hours. CBG: No results for input(s): GLUCAP in the last 168 hours. Lipid Profile: No results for input(s): CHOL, HDL, LDLCALC, TRIG, CHOLHDL, LDLDIRECT in the last 72 hours. Thyroid Function Tests: No results for input(s): TSH, T4TOTAL, FREET4, T3FREE, THYROIDAB in the last 72 hours. Anemia Panel: No results for input(s): VITAMINB12, FOLATE, FERRITIN, TIBC, IRON, RETICCTPCT in the last 72 hours. Urine analysis:    Component Value Date/Time   COLORURINE AMBER (A) 09/30/2019 1326   APPEARANCEUR CLOUDY (A) 09/30/2019 1326   LABSPEC 1.030 09/30/2019 1326   PHURINE 5.0 09/30/2019 1326   GLUCOSEU NEGATIVE 09/30/2019 1326   HGBUR NEGATIVE 09/30/2019 1326   BILIRUBINUR SMALL (A) 09/30/2019  1326   KETONESUR 5 (A) 09/30/2019 1326   PROTEINUR 100 (A) 09/30/2019 1326   NITRITE NEGATIVE 09/30/2019 1326   LEUKOCYTESUR TRACE (A) 09/30/2019 1326    Radiological Exams on Admission: DG Chest 1 View  Result Date: 09/30/2019 CLINICAL DATA:  Reportedly swallowed foreign object EXAM: CHEST  1 VIEW COMPARISON:  None. FINDINGS: Lungs are clear. Heart size and pulmonary vascularity are normal. No radiopaque foreign body evident. There is aortic atherosclerosis. No evident adenopathy. There is left-sided acromioclavicular separation. IMPRESSION: No radiopaque foreign body.  Lungs clear.  Heart size normal. Aortic Atherosclerosis (ICD10-I70.0). Acromioclavicular separation noted on the left. Electronically Signed   By: Bretta Bang III M.D.   On: 09/30/2019 14:16   DG Abdomen 1 View  Result Date: 09/30/2019 CLINICAL DATA:  Reportedly swallowed foreign body EXAM: ABDOMEN -  1 VIEW COMPARISON:  None. FINDINGS: No radiopaque foreign body evident. No bowel dilatation or air-fluid level to suggest bowel obstruction. No free air. No abnormal calcifications. There is lumbar levoscoliosis. IMPRESSION: No evident radiopaque foreign body. No bowel obstruction or free air evident. Electronically Signed   By: Bretta Bang III M.D.   On: 09/30/2019 14:17   CT Abdomen Pelvis W Contrast  Result Date: 09/30/2019 CLINICAL DATA:  Possible foreign body with bloody diarrhea EXAM: CT ABDOMEN AND PELVIS WITH CONTRAST TECHNIQUE: Multidetector CT imaging of the abdomen and pelvis was performed using the standard protocol following bolus administration of intravenous contrast. CONTRAST:  OMNIPAQUE IOHEXOL 300 MG/ML  SOLN COMPARISON:  05/31/2019 CT, plain film from earlier in the same day FINDINGS: Lower chest: No acute abnormality. Hepatobiliary: Liver is within normal limits. Gallbladder is well distended with dependent density similar to that seen on the prior exam consistent with small gallstones. No biliary  ductal dilatation is seen. Pancreas: Unremarkable. No pancreatic ductal dilatation or surrounding inflammatory changes. Spleen: Normal in size without focal abnormality. Adrenals/Urinary Tract: The adrenal glands are within normal limits. Kidneys demonstrate a normal enhancement pattern with the exception of a few small cysts in the left kidney. Normal excretion of contrast is noted. No ureteral abnormality is seen. The bladder is decompressed with wall thickening consistent with the decompressed state. Stomach/Bowel: Diffuse edematous changes in the rectosigmoid are noted consistent with focal colitis. The more proximal colon appears within normal limits. The appendix is un remark. No small bowel abnormality is noted. No radiopaque foreign body is seen in the bowel. Vascular/Lymphatic: Aortic atherosclerosis. No enlarged abdominal or pelvic lymph nodes. Reproductive: Prostate is unremarkable. Other: No abdominal wall hernia or abnormality. No abdominopelvic ascites. Musculoskeletal: Degenerative changes of the lumbar spine are seen. IMPRESSION: Changes consistent with colitis in the rectosigmoid. No abscess formation is noted. Cholelithiasis without complicating factors. No definitive foreign body is seen. Electronically Signed   By: Alcide Clever M.D.   On: 09/30/2019 21:11     Assessment/Plan 64 year old male with history of severe intellectual disability, epilepsy with generalized tonic-clonic seizures, HTN, history of pica/ingestion of paper towel, chronic constipation on laxatives, with ER visit back in May 2021 for related diarrhea with nonacute CT abdomen and pelvis, as well as recent  Klebsiella pneumonia UTI 08/2019, presenting with bloody diarrhea x1 day preceded by ingestion of paper towels . He was previously in his usual state of health.  Colitis with rectal bleeding Unintentional weight loss Pica/ recurrent ingestion of paper products Chronic laxative use -Patient presents with bloody  diarrhea in the setting of ongoingingestion of paper products, chronic laxative use, but with one year history of unintentional weight loss -CT abdomen and pelvis and abdominal x-ray with no evidence of obstruction or free air but shows focal colitis in the rectosigmoid -WBC 19,000 without tachycardia or fever.  Mild hypotension and AKI suspect related to dehydration from diarrhea.  Sepsis not suspected at this time but close monitoring to continue for early signs -IV Rocephin and Flagyl -Clear liquid diet and IV fluids -Consider GI consult in the am -Monitor H&H    UTI (urinary tract infection) -Urinalysis with pyuria -History of Klebsiella pneumonia UTI in August 2021 -Continue Rocephin    AKI (acute kidney injury) (HCC) -Suspect prerenal related to diarrhea -Creatinine 1.46 above baseline of 0.55 -Continue IV hydration and monitor renal function avoiding nephrotoxins    Hypotension, in the setting of history of hypertension -Systolic in the 90s on arrival -  Likely related to the dehydration -Hold home antihypertensives -Continue IV hydration    Intellectual disability -Increase nursing assistance for communication of needs --continue trazodone 50/50/100mg  --continue risperdal 0.5mg  in the am and two at nights --also on clonidine  For behavior but will hold due to hypotension --Ativan prn behavioral disturbance    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 This is judged to be reasonable and necessary in order to provide the required intensity of service to ensure the patient's safety  given the presenting symptoms, physical exam findings, and initial radiographic and laboratory data in the context of their  Comorbid conditions.   Patient requires inpatient status due to high intensity of service, high risk for further deterioration and high frequency of surveillance required.   I certify that at the point of admission it is my clinical judgment that the patient will require inpatient hospital care spanning beyond 2 midnights     Andris Baumann MD [facility]     09/30/2019, 9:51 PM

## 2019-09-30 NOTE — ED Triage Notes (Signed)
Pt is here with caregiver who states the pt swallowed an unknown object today and has been having diarrhea. Pt has a hx of swallowing foreign objects.

## 2019-10-01 ENCOUNTER — Encounter: Payer: Self-pay | Admitting: Internal Medicine

## 2019-10-01 DIAGNOSIS — K529 Noninfective gastroenteritis and colitis, unspecified: Principal | ICD-10-CM

## 2019-10-01 DIAGNOSIS — N179 Acute kidney failure, unspecified: Secondary | ICD-10-CM

## 2019-10-01 DIAGNOSIS — F79 Unspecified intellectual disabilities: Secondary | ICD-10-CM

## 2019-10-01 DIAGNOSIS — G40409 Other generalized epilepsy and epileptic syndromes, not intractable, without status epilepticus: Secondary | ICD-10-CM

## 2019-10-01 DIAGNOSIS — K921 Melena: Secondary | ICD-10-CM

## 2019-10-01 DIAGNOSIS — R197 Diarrhea, unspecified: Secondary | ICD-10-CM

## 2019-10-01 DIAGNOSIS — K625 Hemorrhage of anus and rectum: Secondary | ICD-10-CM

## 2019-10-01 LAB — CBC
HCT: 33.9 % — ABNORMAL LOW (ref 39.0–52.0)
Hemoglobin: 12.4 g/dL — ABNORMAL LOW (ref 13.0–17.0)
MCH: 32.9 pg (ref 26.0–34.0)
MCHC: 36.6 g/dL — ABNORMAL HIGH (ref 30.0–36.0)
MCV: 89.9 fL (ref 80.0–100.0)
Platelets: 197 10*3/uL (ref 150–400)
RBC: 3.77 MIL/uL — ABNORMAL LOW (ref 4.22–5.81)
RDW: 13.3 % (ref 11.5–15.5)
WBC: 9.8 10*3/uL (ref 4.0–10.5)
nRBC: 0 % (ref 0.0–0.2)

## 2019-10-01 LAB — BASIC METABOLIC PANEL
Anion gap: 10 (ref 5–15)
BUN: 26 mg/dL — ABNORMAL HIGH (ref 8–23)
CO2: 23 mmol/L (ref 22–32)
Calcium: 7.9 mg/dL — ABNORMAL LOW (ref 8.9–10.3)
Chloride: 99 mmol/L (ref 98–111)
Creatinine, Ser: 1.39 mg/dL — ABNORMAL HIGH (ref 0.61–1.24)
GFR calc Af Amer: >60 mL/min (ref >60)
GFR calc non Af Amer: 54 mL/min — ABNORMAL LOW (ref >60)
Glucose, Bld: 115 mg/dL — ABNORMAL HIGH (ref 70–99)
Potassium: 3.8 mmol/L (ref 3.5–5.1)
Sodium: 132 mmol/L — ABNORMAL LOW (ref 135–145)

## 2019-10-01 LAB — LACTIC ACID, PLASMA: Lactic Acid, Venous: 1.1 mmol/L (ref 0.5–1.9)

## 2019-10-01 LAB — HIV ANTIBODY (ROUTINE TESTING W REFLEX): HIV Screen 4th Generation wRfx: NONREACTIVE

## 2019-10-01 MED ORDER — PANTOPRAZOLE SODIUM 40 MG PO TBEC
40.0000 mg | DELAYED_RELEASE_TABLET | Freq: Every day | ORAL | Status: DC
  Administered 2019-10-01 – 2019-10-03 (×3): 40 mg via ORAL
  Filled 2019-10-01 (×3): qty 1

## 2019-10-01 MED ORDER — BOOST / RESOURCE BREEZE PO LIQD CUSTOM
1.0000 | Freq: Three times a day (TID) | ORAL | Status: DC
  Administered 2019-10-01 – 2019-10-03 (×6): 1 via ORAL

## 2019-10-01 MED ORDER — ADULT MULTIVITAMIN W/MINERALS CH
1.0000 | ORAL_TABLET | Freq: Every day | ORAL | Status: DC
  Administered 2019-10-02 – 2019-10-03 (×2): 1 via ORAL
  Filled 2019-10-01 (×2): qty 1

## 2019-10-01 MED ORDER — HALOPERIDOL LACTATE 5 MG/ML IJ SOLN
5.0000 mg | Freq: Four times a day (QID) | INTRAMUSCULAR | Status: DC | PRN
  Administered 2019-10-01: 5 mg via INTRAMUSCULAR
  Filled 2019-10-01: qty 1

## 2019-10-01 MED ORDER — METRONIDAZOLE 500 MG PO TABS
500.0000 mg | ORAL_TABLET | Freq: Three times a day (TID) | ORAL | Status: DC
  Administered 2019-10-01 – 2019-10-03 (×5): 500 mg via ORAL
  Filled 2019-10-01 (×9): qty 1

## 2019-10-01 MED ORDER — CIPROFLOXACIN HCL 500 MG PO TABS
500.0000 mg | ORAL_TABLET | Freq: Two times a day (BID) | ORAL | Status: DC
  Administered 2019-10-01 – 2019-10-03 (×4): 500 mg via ORAL
  Filled 2019-10-01 (×4): qty 1

## 2019-10-01 NOTE — Progress Notes (Signed)
RN aware of the previous notes, re: pts.reactions to PIV start.

## 2019-10-01 NOTE — Progress Notes (Addendum)
Progress Note    GURDEEP KEESEY  TJQ:[id number] DOB: [birth year]  DOA: 09/30/2019 PCP: Lauro Regulus, MD      Brief Narrative:    Medical records reviewed and are as summarized below:  Ryan Mayer is a 64 y.o. male with medical history significant for severe intellectual disability, epilepsy with generalized tonic-clonic seizures, HTN, baseline constipation on a daily laxative, history of ingestion of paper products, with ER visit back in May 2021 for diarrhea with nonacute CT abdomen and pelvis, treated as outpatient one month prior to admission for Klebsiella pneumonia UTI.  He was brought to the hospital because of complaints of bloody diarrhea and ingesting paper towels.  Reportedly, he has had worsening behavioral issues for the past 2 years and has had pica for the past 6 months, and has lost about 30 pounds in the past year despite having good appetite.  CT abdomen pelvis showed colitis.  He was also found to have hypotension and elevated creatinine above baseline.  He was admitted to the hospital for acute colitis, unintentional weight loss, acute kidney injury and hypotension.  He was started on empiric IV ceftriaxone and Flagyl.  He was also treated with IV fluids.  Gastroenterologist has been consulted for further evaluation of colitis and weight loss.     Assessment/Plan:   Principal Problem:   Colitis with rectal bleeding Active Problems:   UTI (urinary tract infection)   AKI (acute kidney injury) (HCC)   Intellectual disability   Pica   Foreign body ingestion   HTN (hypertension), benign   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)   Hypotension  Body mass index is 25.32 kg/m.   Weight loss (about 7 pounds- not 30 pounds as previously reported.)    PLAN  Continue IV ceftriaxone and Flagyl for colitis.  Stool for C. difficile toxin, ova and parasites if bloody diarrhea persist Consulted gastroenterologist, Dr. Servando Snare,  for further evaluation. He also has abnormal urinalysis.  Obtain urine culture for further evaluation. Continue IV fluids for AKI and monitor BMP. Hold antihypertensives because of recent hypotension and AKI. Continue Tegretol for seizure disorder. Continue psychotropics Continue one-to-one sitter for safety. Unfortunately, patient has pulled out his peripheral IV again.  This was discussed with his legal guardian, Ms. Cherlynn Polo.  Using wrist restraints and medication to calm him down were discussed.  Ms. Shon Baton is okay with both restraints and sedatives.    Diet Order            Diet clear liquid Room service appropriate? Yes; Fluid consistency: Thin  Diet effective now                    Consultants:  Gastroenterologist  Procedures:  None    Medications:   . carbamazepine  200 mg Oral TID  . escitalopram  10 mg Oral Daily  . risperiDONE  0.5 mg Oral Daily  . risperiDONE  1 mg Oral QHS  . traZODone  50 mg Oral TID  . traZODone  50 mg Oral QHS   Continuous Infusions: . sodium chloride    . sodium chloride 125 mL/hr at 10/01/19 0750  . cefTRIAXone (ROCEPHIN)  IV 2 g (10/01/19 1007)  . metronidazole 500 mg (10/01/19 0452)     Anti-infectives (From admission, onward)   Start     Dose/Rate Route Frequency Ordered Stop   10/01/19 1000  cefTRIAXone (ROCEPHIN) 2 g in sodium chloride 0.9 % 100  mL IVPB        2 g 200 mL/hr over 30 Minutes Intravenous Every 24 hours 09/30/19 2150     10/01/19 0600  metroNIDAZOLE (FLAGYL) IVPB 500 mg        500 mg 100 mL/hr over 60 Minutes Intravenous Every 8 hours 09/30/19 2147     09/30/19 2200  cefTRIAXone (ROCEPHIN) 2 g in sodium chloride 0.9 % 100 mL IVPB  Status:  Discontinued        2 g 200 mL/hr over 30 Minutes Intravenous Every 24 hours 09/30/19 2147 09/30/19 2150   09/30/19 2115  metroNIDAZOLE (FLAGYL) IVPB 500 mg        500 mg 100 mL/hr over 60 Minutes Intravenous  Once 09/30/19 2115 09/30/19 2241   09/30/19 1745   cefTRIAXone (ROCEPHIN) 1 g in sodium chloride 0.9 % 100 mL IVPB        1 g 200 mL/hr over 30 Minutes Intravenous  Once 09/30/19 1741 09/30/19 1916             Family Communication/Anticipated D/C date and plan/Code Status   DVT prophylaxis: SCDs Start: 09/30/19 2151     Code Status: Full Code  Family Communication:  Disposition Plan:    Status is: Inpatient  Remains inpatient appropriate because:IV treatments appropriate due to intensity of illness or inability to take PO and Inpatient level of care appropriate due to severity of illness   Dispo: The patient is from: Group home              Anticipated d/c is to: Group home              Anticipated d/c date is: 2 days              Patient currently is not medically stable to d/c.           Subjective:   Interval events noted.  He is confused unable to provide history.  Objective:    Vitals:   09/30/19 2136 09/30/19 2317 10/01/19 0116 10/01/19 0546  BP: 106/68 119/70 116/70 (!) 147/103  Pulse: 80 88 75 (!) 108  Resp: 16 18 20 20   Temp: 98.5 F (36.9 C) 98.5 F (36.9 C) 99 F (37.2 C) 97.7 F (36.5 C)  TempSrc: Axillary Oral Oral Oral  SpO2: 98% 96% 99% 99%  Weight:  58.8 kg    Height:  5' (1.524 m)     No data found.   Intake/Output Summary (Last 24 hours) at 10/01/2019 1041 Last data filed at 10/01/2019 0329 Gross per 24 hour  Intake 510.06 ml  Output --  Net 510.06 ml   Filed Weights   09/30/19 1315 09/30/19 2317  Weight: 61.2 kg 58.8 kg    Exam:  GEN: NAD SKIN: No rash EYES: EOMI ENT: MMM CV: RRR PULM: CTA B ABD: soft, ND, NT, +BS CNS: Drowsy but arousable, confused, non focal EXT: No edema or tenderness   Data Reviewed:   I have personally reviewed following labs and imaging studies:  Labs: Labs show the following:   Basic Metabolic Panel: Recent Labs  Lab 09/30/19 1326 10/01/19 0524  NA 133* 132*  K 4.4 3.8  CL 95* 99  CO2 27 23  GLUCOSE 159* 115*  BUN 18  26*  CREATININE 1.46* 1.39*  CALCIUM 9.3 7.9*   GFR Estimated Creatinine Clearance: 39.5 mL/min (A) (by C-G formula based on SCr of 1.39 mg/dL (H)). Liver Function Tests: Recent Labs  Lab 09/30/19 1326  AST 22  ALT 20  ALKPHOS 91  BILITOT 0.9  PROT 7.6  ALBUMIN 4.1   Recent Labs  Lab 09/30/19 1326  LIPASE 27   No results for input(s): AMMONIA in the last 168 hours. Coagulation profile No results for input(s): INR, PROTIME in the last 168 hours.  CBC: Recent Labs  Lab 09/30/19 1326 10/01/19 0524  WBC 19.2* 9.8  HGB 16.9 12.4*  HCT 45.9 33.9*  MCV 86.9 89.9  PLT 271 197   Cardiac Enzymes: No results for input(s): CKTOTAL, CKMB, CKMBINDEX, TROPONINI in the last 168 hours. BNP (last 3 results) No results for input(s): PROBNP in the last 8760 hours. CBG: No results for input(s): GLUCAP in the last 168 hours. D-Dimer: No results for input(s): DDIMER in the last 72 hours. Hgb A1c: No results for input(s): HGBA1C in the last 72 hours. Lipid Profile: No results for input(s): CHOL, HDL, LDLCALC, TRIG, CHOLHDL, LDLDIRECT in the last 72 hours. Thyroid function studies: No results for input(s): TSH, T4TOTAL, T3FREE, THYROIDAB in the last 72 hours.  Invalid input(s): FREET3 Anemia work up: No results for input(s): VITAMINB12, FOLATE, FERRITIN, TIBC, IRON, RETICCTPCT in the last 72 hours. Sepsis Labs: Recent Labs  Lab 09/30/19 1326 09/30/19 2144 10/01/19 0524  WBC 19.2*  --  9.8  LATICACIDVEN  --  3.0* 1.1    Microbiology Recent Results (from the past 240 hour(s))  SARS Coronavirus 2 by RT PCR (hospital order, performed in [facility] hospital lab) Nasopharyngeal Nasopharyngeal Swab     Status: None   Collection Time: 09/30/19  7:10 PM   Specimen: Nasopharyngeal Swab  Result Value Ref Range Status   SARS Coronavirus 2 NEGATIVE NEGATIVE Final    Comment: (NOTE) SARS-CoV-2 target nucleic acids are NOT DETECTED.  The SARS-CoV-2 RNA is generally detectable in  upper and lower respiratory specimens during the acute phase of infection. The lowest concentration of SARS-CoV-2 viral copies this assay can detect is 250 copies / mL. A negative result does not preclude SARS-CoV-2 infection and should not be used as the sole basis for treatment or other patient management decisions.  A negative result may occur with improper specimen collection / handling, submission of specimen other than nasopharyngeal swab, presence of viral mutation(s) within the areas targeted by this assay, and inadequate number of viral copies (<250 copies / mL). A negative result must be combined with clinical observations, patient history, and epidemiological information.  Fact Sheet for Patients:   BoilerBrush.com.cy  Fact Sheet for Healthcare Providers: https://pope.com/  This test is not yet approved or  cleared by the [place] FDA and has been authorized for detection and/or diagnosis of SARS-CoV-2 by FDA under an Emergency Use Authorization (EUA).  This EUA will remain in effect (meaning this test can be used) for the duration of the COVID-19 declaration under Section 564(b)(1) of the Act, 21 U.S.C. section 360bbb-3(b)(1), unless the authorization is terminated or revoked sooner.  Performed at [facility], [address] [place], [place] [address]     Procedures and diagnostic studies:  DG Chest 1 View  Result Date: 09/30/2019 CLINICAL DATA:  Reportedly swallowed foreign object EXAM: CHEST  1 VIEW COMPARISON:  None. FINDINGS: Lungs are clear. Heart size and pulmonary vascularity are normal. No radiopaque foreign body evident. There is aortic atherosclerosis. No evident adenopathy. There is left-sided acromioclavicular separation. IMPRESSION: No radiopaque foreign body.  Lungs clear.  Heart size normal. Aortic Atherosclerosis (ICD10-I70.0). Acromioclavicular separation noted on the left. Electronically  Signed  By: Bretta BangWilliam  Woodruff III M.D.   On: 09/30/2019 14:16   DG Abdomen 1 View  Result Date: 09/30/2019 CLINICAL DATA:  Reportedly swallowed foreign body EXAM: ABDOMEN - 1 VIEW COMPARISON:  None. FINDINGS: No radiopaque foreign body evident. No bowel dilatation or air-fluid level to suggest bowel obstruction. No free air. No abnormal calcifications. There is lumbar levoscoliosis. IMPRESSION: No evident radiopaque foreign body. No bowel obstruction or free air evident. Electronically Signed   By: Bretta BangWilliam  Woodruff III M.D.   On: 09/30/2019 14:17   CT Abdomen Pelvis W Contrast  Result Date: 09/30/2019 CLINICAL DATA:  Possible foreign body with bloody diarrhea EXAM: CT ABDOMEN AND PELVIS WITH CONTRAST TECHNIQUE: Multidetector CT imaging of the abdomen and pelvis was performed using the standard protocol following bolus administration of intravenous contrast. CONTRAST:  100mL OMNIPAQUE IOHEXOL 300 MG/ML  SOLN COMPARISON:  05/31/2019 CT, plain film from earlier in the same day FINDINGS: Lower chest: No acute abnormality. Hepatobiliary: Liver is within normal limits. Gallbladder is well distended with dependent density similar to that seen on the prior exam consistent with small gallstones. No biliary ductal dilatation is seen. Pancreas: Unremarkable. No pancreatic ductal dilatation or surrounding inflammatory changes. Spleen: Normal in size without focal abnormality. Adrenals/Urinary Tract: The adrenal glands are within normal limits. Kidneys demonstrate a normal enhancement pattern with the exception of a few small cysts in the left kidney. Normal excretion of contrast is noted. No ureteral abnormality is seen. The bladder is decompressed with wall thickening consistent with the decompressed state. Stomach/Bowel: Diffuse edematous changes in the rectosigmoid are noted consistent with focal colitis. The more proximal colon appears within normal limits. The appendix is un remark. No small bowel abnormality is  noted. No radiopaque foreign body is seen in the bowel. Vascular/Lymphatic: Aortic atherosclerosis. No enlarged abdominal or pelvic lymph nodes. Reproductive: Prostate is unremarkable. Other: No abdominal wall hernia or abnormality. No abdominopelvic ascites. Musculoskeletal: Degenerative changes of the lumbar spine are seen. IMPRESSION: Changes consistent with colitis in the rectosigmoid. No abscess formation is noted. Cholelithiasis without complicating factors. No definitive foreign body is seen. Electronically Signed   By: Alcide CleverMark  Lukens M.D.   On: 09/30/2019 21:11               LOS: 1 day   Demetri Kerman  [facility]   Pager on [email or web address]. If 7PM-7AM, please contact night-coverage at [email or web address]     10/01/2019, 10:41 AM

## 2019-10-01 NOTE — Progress Notes (Signed)
Initial Nutrition Assessment  DOCUMENTATION CODES:   Non-severe (moderate) malnutrition in context of social or environmental circumstances  INTERVENTION:   Boost Breeze po TID, each supplement provides 250 kcal and 9 grams of protein  MVI daily   Dysphagia 3 diet with advancement   NUTRITION DIAGNOSIS:   Moderate Malnutrition related to social / environmental circumstances as evidenced by mild fat depletion, moderate fat depletion, moderate muscle depletion, severe muscle depletion.  GOAL:   Patient will meet greater than or equal to 90% of their needs  MONITOR:   PO intake, Supplement acceptance, Labs, Weight trends, Skin, I & O's, Diet advancement  REASON FOR ASSESSMENT:   Malnutrition Screening Tool    ASSESSMENT:   63 y.o. male with medical history significant for severe intellectual disability, epilepsy with generalized tonic-clonic seizures, HTN, baseline constipation on a daily laxative, pica, Klebsiella pneumonia and UTI who is admitted with colitis   Met with pt in room today. Pt unable to provide any meaningful history so history obtained mainly from chart review. Per chart, pt with good appetite and oral intake at baseline per caretaker. Caretaker reports a 30lb weight loss over the past year despite pt with good appetite. Per chart, pt weighed 136lbs in October 2020, 128lbs in April 2021 and 127lbs in May 2021. It appears pt has only lost 7lbs(5%) over the past year; this is not significant. Spoke with sitter at bedside who reports pt with good oral intake in hospital; pt eating 100% of meals and constantly drinking water. RD will add supplements to help pt meet his estimated needs. Per caretaker report, pt will need a mechanical soft diet once advanced.   Medications reviewed and include: protonix, ceftriaxone, metronidazole   Labs reviewed: Na 132(L), BUN 26(H), creat 1.39(H)  NUTRITION - FOCUSED PHYSICAL EXAM:    Most Recent Value  Orbital Region Mild  depletion  Upper Arm Region Moderate depletion  Thoracic and Lumbar Region Mild depletion  Buccal Region Mild depletion  Temple Region Mild depletion  Clavicle Bone Region Mild depletion  Clavicle and Acromion Bone Region Mild depletion  Scapular Bone Region Mild depletion  Dorsal Hand Mild depletion  Patellar Region Severe depletion  Anterior Thigh Region Severe depletion  Posterior Calf Region Severe depletion  Edema (RD Assessment) None  Hair Reviewed  Eyes Reviewed  Mouth Reviewed  Skin Reviewed  Nails Reviewed     Diet Order:   Diet Order            Diet clear liquid Room service appropriate? Yes; Fluid consistency: Thin  Diet effective now                EDUCATION NEEDS:   No education needs have been identified at this time  Skin:  Skin Assessment: Reviewed RN Assessment (ecchymosis)  Last BM:  9/20- type 7  Height:   Ht Readings from Last 1 Encounters:  09/30/19 5' (1.524 m)    Weight:   Wt Readings from Last 1 Encounters:  09/30/19 58.8 kg    Ideal Body Weight:  45.45 kg  BMI:  Body mass index is 25.32 kg/m.  Estimated Nutritional Needs:   Kcal:  1500-1700kcal/day  Protein:  75-85g/day  Fluid:  >1.5L/day    MS, RD, LDN Please refer to AMION for RD and/or RD on-call/weekend/after hours pager  

## 2019-10-01 NOTE — Consult Note (Signed)
Ryan Miniumarren Mohini Heathcock, MD [facility]  [address] [place], [place] [address] Phone: [phone] Fax : [phone]  Consultation  Referring Provider:     Dr. Para Marchuncan Primary Care Physician:  Lauro RegulusAnderson, Marshall W, MD Primary Gastroenterologist: Gentry FitzUnassigned         Reason for Consultation:     Rectal bleeding and diarrhea  Date of Admission:  09/30/2019 Date of Consultation:  10/01/2019         HPI:   Ryan Mayer is a 64 y.o. male who has severe intellectual disability epilepsy and generalized clonic tonic seizures who was admitted with diarrhea with blood after ingesting paper towels.  The patient was admitted to the hospital with a low blood pressure of 97/69.  The patient's white cell count was 19,000 with an elevated creatinine of 1.46.  The patient had a CT scan of the abdomen that showed him to have:  IMPRESSION: Changes consistent with colitis in the rectosigmoid. No abscess formation is noted. Cholelithiasis without complicating factors. No definitive foreign body is seen  The patient has a history of chronic constipation.  The patient's white cell count came down to 9.8 today.  Since admission the patient has not had any further signs of any GI bleeding and he has not had a bowel movement.  No stool studies were sent off due to the lack of bowel movement.  The patient is unable to give any meaningful history and continuously just asks for some more water  Past Medical History:  Diagnosis Date  . Hypertension   . Intellectual disability   . Seizure [facility](HCC)     History reviewed. No pertinent surgical history.  Prior to Admission medications   Medication Sig Start Date End Date Taking? Authorizing Provider  amLODipine (NORVASC) 5 MG tablet Take 5 mg by mouth daily. 09/10/19  Yes [provider]  aspirin EC 81 MG tablet Take 81 mg by mouth daily. Swallow whole.   Yes [provider]  bisacodyl (DULCOLAX) 5 MG EC tablet Take 10 mg by mouth every 12 (twelve)  hours as needed for severe constipation.   Yes [provider]  carbamazepine (TEGRETOL) 200 MG tablet Take 200 mg by mouth 3 (three) times daily. 09/10/19  Yes [provider]  carbamide peroxide (DEBROX) 6.5 % OTIC solution Place 5 drops into both ears once a week.   Yes [provider]  carvedilol (COREG) 6.25 MG tablet Take 6.25 mg by mouth 2 (two) times daily. 09/10/19  Yes [provider]  cetirizine (ZYRTEC) 10 MG tablet Take 10 mg by mouth 2 (two) times daily.   Yes [provider]  cloNIDine (CATAPRES) 0.1 MG tablet Take 0.1 mg by mouth 3 (three) times daily. 09/10/19  Yes [provider]  diphenhydrAMINE (BENADRYL) 25 mg capsule Take 25 mg by mouth every 6 (six) hours as needed for itching (hives).   Yes [provider]  docusate sodium (COLACE) 100 MG capsule Take 100 mg by mouth 3 (three) times daily.   Yes [provider]  Emollient (CETAPHIL) cream Apply 1 application topically as directed. Apply after bath   Yes [provider]  escitalopram (LEXAPRO) 10 MG tablet Take 10 mg by mouth daily. 09/10/19  Yes [provider]  fluticasone (FLONASE) 50 MCG/ACT nasal spray Place 1 spray into both nostrils 2 (two) times daily. 09/19/19  Yes [provider]  guaiFENesin (ROBITUSSIN) 100 MG/5ML SOLN Take 5 mLs by mouth every 4 (four) hours as needed  for cough.   Yes [provider]  hydrOXYzine (ATARAX/VISTARIL) 25 MG tablet Take 25 mg by mouth 3 (three) times daily. 09/10/19  Yes [provider]  levocetirizine (XYZAL) 5 MG tablet Take 5 mg by mouth at bedtime.  09/10/19  Yes [provider]  lisinopril (ZESTRIL) 20 MG tablet Take 20 mg by mouth daily. Take with Lisinopril/HCTZ 20/12.5 09/10/19  Yes [provider]  lisinopril-hydrochlorothiazide (ZESTORETIC) 20-12.5 MG tablet Take 1 tablet by mouth daily. 09/10/19  Yes [provider]  magnesium hydroxide (MILK  OF MAGNESIA) 400 MG/5ML suspension Take 60 mLs by mouth every Friday.   Yes [provider]  metroNIDAZOLE (METROGEL) 0.75 % gel Apply 1 application topically at bedtime.   Yes [provider]  mometasone (ELOCON) 0.1 % cream Apply 1 application topically 2 (two) times daily as needed (rash/hives).   Yes [provider]  pantoprazole (PROTONIX) 40 MG tablet Take 40 mg by mouth daily. 09/10/19  Yes [provider]  polyethylene glycol powder (GLYCOLAX/MIRALAX) 17 GM/SCOOP powder Take 9 g by mouth daily.   Yes [provider]  selenium sulfide (SELSUN) 1 % LOTN Apply 1 application topically 2 (two) times a week. Tuesday/Saturday   Yes [provider]    No family history on file.   Social History   Tobacco Use  . Smoking status: Never Smoker  . Smokeless tobacco: Never Used  Substance Use Topics  . Alcohol use: Never  . Drug use: Never    Allergies as of 09/30/2019  . (No Known Allergies)    Review of Systems:    All systems reviewed and negative except where noted in HPI.   Physical Exam:  Vital signs in last 24 hours: Temp:  [97.7 F (36.5 C)-99 F (37.2 C)] 97.7 F (36.5 C) (09/21 0546) Pulse Rate:  [75-108] 108 (09/21 0546) Resp:  [16-20] 20 (09/21 0546) BP: (98-147)/(68-103) 147/103 (09/21 0546) SpO2:  [96 %-99 %] 99 % (09/21 0546) Weight:  [58.8 kg] 58.8 kg (09/20 2317) Last BM Date: 09/30/19 General:   Pleasant, not cooperative in NAD Head:  Normocephalic and atraumatic. Eyes:   No icterus.   Conjunctiva pink. PERRLA. Ears:  Normal auditory acuity. Neck:  Supple; no masses or thyroidomegaly Lungs: Respirations even and unlabored. Lungs clear to auscultation bilaterally.   No wheezes, crackles, or rhonchi.  Heart:  Regular rate and rhythm;  Without murmur, clicks, rubs or gallops Abdomen:  Soft, nondistended, nontender. Normal bowel sounds. No appreciable masses or hepatomegaly.  No rebound or guarding.  Rectal:   Not performed. Msk:  Symmetrical without gross deformities.    Extremities:  Without edema, cyanosis or clubbing. Neurologic:  Alert and oriented x0;  grossly normal neurologically. Skin:  Intact without significant lesions or rashes. Cervical Nodes:  No significant cervical adenopathy. Psych:  Alert and not cooperative.   LAB RESULTS: Recent Labs    09/30/19 1326 10/01/19 0524  WBC 19.2* 9.8  HGB 16.9 12.4*  HCT 45.9 33.9*  PLT 271 197   BMET Recent Labs    09/30/19 1326 10/01/19 0524  NA 133* 132*  K 4.4 3.8  CL 95* 99  CO2 27 23  GLUCOSE 159* 115*  BUN 18 26*  CREATININE 1.46* 1.39*  CALCIUM 9.3 7.9*   LFT Recent Labs    09/30/19 1326  PROT 7.6  ALBUMIN 4.1  AST 22  ALT 20  ALKPHOS 91  BILITOT 0.9   PT/INR No results for input(s): LABPROT, INR in  the last 72 hours.  STUDIES: DG Chest 1 View  Result Date: 09/30/2019 CLINICAL DATA:  Reportedly swallowed foreign object EXAM: CHEST  1 VIEW COMPARISON:  None. FINDINGS: Lungs are clear. Heart size and pulmonary vascularity are normal. No radiopaque foreign body evident. There is aortic atherosclerosis. No evident adenopathy. There is left-sided acromioclavicular separation. IMPRESSION: No radiopaque foreign body.  Lungs clear.  Heart size normal. Aortic Atherosclerosis (ICD10-I70.0). Acromioclavicular separation noted on the left. Electronically Signed   By: Bretta Bang III M.D.   On: 09/30/2019 14:16   DG Abdomen 1 View  Result Date: 09/30/2019 CLINICAL DATA:  Reportedly swallowed foreign body EXAM: ABDOMEN - 1 VIEW COMPARISON:  None. FINDINGS: No radiopaque foreign body evident. No bowel dilatation or air-fluid level to suggest bowel obstruction. No free air. No abnormal calcifications. There is lumbar levoscoliosis. IMPRESSION: No evident radiopaque foreign body. No bowel obstruction or free air evident. Electronically Signed   By: Bretta Bang III M.D.   On: 09/30/2019 14:17   CT Abdomen Pelvis W  Contrast  Result Date: 09/30/2019 CLINICAL DATA:  Possible foreign body with bloody diarrhea EXAM: CT ABDOMEN AND PELVIS WITH CONTRAST TECHNIQUE: Multidetector CT imaging of the abdomen and pelvis was performed using the standard protocol following bolus administration of intravenous contrast. CONTRAST:  OMNIPAQUE IOHEXOL 300 MG/ML  SOLN COMPARISON:  05/31/2019 CT, plain film from earlier in the same day FINDINGS: Lower chest: No acute abnormality. Hepatobiliary: Liver is within normal limits. Gallbladder is well distended with dependent density similar to that seen on the prior exam consistent with small gallstones. No biliary ductal dilatation is seen. Pancreas: Unremarkable. No pancreatic ductal dilatation or surrounding inflammatory changes. Spleen: Normal in size without focal abnormality. Adrenals/Urinary Tract: The adrenal glands are within normal limits. Kidneys demonstrate a normal enhancement pattern with the exception of a few small cysts in the left kidney. Normal excretion of contrast is noted. No ureteral abnormality is seen. The bladder is decompressed with wall thickening consistent with the decompressed state. Stomach/Bowel: Diffuse edematous changes in the rectosigmoid are noted consistent with focal colitis. The more proximal colon appears within normal limits. The appendix is un remark. No small bowel abnormality is noted. No radiopaque foreign body is seen in the bowel. Vascular/Lymphatic: Aortic atherosclerosis. No enlarged abdominal or pelvic lymph nodes. Reproductive: Prostate is unremarkable. Other: No abdominal wall hernia or abnormality. No abdominopelvic ascites. Musculoskeletal: Degenerative changes of the lumbar spine are seen. IMPRESSION: Changes consistent with colitis in the rectosigmoid. No abscess formation is noted. Cholelithiasis without complicating factors. No definitive foreign body is seen. Electronically Signed   By: Alcide Clever M.D.   On: 09/30/2019 21:11       Impression / Plan:   Assessment: Principal Problem:   Colitis with rectal bleeding Active Problems:   UTI (urinary tract infection)   AKI (acute kidney injury) (HCC)   Intellectual disability   Pica   Foreign body ingestion   HTN (hypertension), benign   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)   Hypotension   USIEL ASTARITA is a 64 y.o. y/o male with an admission for diarrhea and rectal bleeding.  The patient has had no stools since admission without any sign of diarrhea or rectal bleeding.  The patient's hemoglobin has been stable after hydration there was a drop that appears to be dilutional.  The patient's colitis can be from ischemic versus due to his constipation versus infectious.  If it was infectious I would  presume the patient would have more GI symptoms such as continued diarrhea and rectal bleeding.  Plan:  The patient will be followed with his stools to be sent off for C. difficile and GI pathogens when he has a bowel movement.  I am not aware of the patient ever having a EGD or colonoscopy.  A colonoscopy may be something that he should be considered to undergo if he starts to have diarrhea or rectal bleeding again.  This can be done as an outpatient if his symptoms continue to improve.  No procedures planned at this time.  Thank you for involving me in the care of this patient.      LOS: 1 day   Ryan Minium, MD, [facility] 10/01/2019, 3:10 PM,  Pager [phone] 7am-5pm  Check AMION for 5pm -7am coverage and on weekends   Note: This dictation was prepared with Dragon dictation along with smaller phrase technology. Any transcriptional errors that result from this process are unintentional.

## 2019-10-01 NOTE — Progress Notes (Signed)
Per sitter. Pt pulled previous IV, placed at 0744 this am. Attempted 3x to convinced pt to get in the bed so I can put  a new IV, pt refused to keep still and started to get agitated. Sitter at bedside, will call primary RN re: this matter.RN at lunch.

## 2019-10-02 DIAGNOSIS — E440 Moderate protein-calorie malnutrition: Secondary | ICD-10-CM | POA: Insufficient documentation

## 2019-10-02 LAB — CBC WITH DIFFERENTIAL/PLATELET
Abs Immature Granulocytes: 0.04 10*3/uL (ref 0.00–0.07)
Basophils Absolute: 0 10*3/uL (ref 0.0–0.1)
Basophils Relative: 1 %
Eosinophils Absolute: 0.1 10*3/uL (ref 0.0–0.5)
Eosinophils Relative: 1 %
HCT: 34.5 % — ABNORMAL LOW (ref 39.0–52.0)
Hemoglobin: 12.2 g/dL — ABNORMAL LOW (ref 13.0–17.0)
Immature Granulocytes: 1 %
Lymphocytes Relative: 13 %
Lymphs Abs: 0.9 10*3/uL (ref 0.7–4.0)
MCH: 31.9 pg (ref 26.0–34.0)
MCHC: 35.4 g/dL (ref 30.0–36.0)
MCV: 90.1 fL (ref 80.0–100.0)
Monocytes Absolute: 0.6 10*3/uL (ref 0.1–1.0)
Monocytes Relative: 10 %
Neutro Abs: 5 10*3/uL (ref 1.7–7.7)
Neutrophils Relative %: 74 %
Platelets: 189 10*3/uL (ref 150–400)
RBC: 3.83 MIL/uL — ABNORMAL LOW (ref 4.22–5.81)
RDW: 13 % (ref 11.5–15.5)
WBC: 6.6 10*3/uL (ref 4.0–10.5)
nRBC: 0 % (ref 0.0–0.2)

## 2019-10-02 LAB — BASIC METABOLIC PANEL
Anion gap: 12 (ref 5–15)
BUN: 11 mg/dL (ref 8–23)
CO2: 24 mmol/L (ref 22–32)
Calcium: 8.8 mg/dL — ABNORMAL LOW (ref 8.9–10.3)
Chloride: 98 mmol/L (ref 98–111)
Creatinine, Ser: 0.78 mg/dL (ref 0.61–1.24)
GFR calc Af Amer: >60 mL/min (ref >60)
GFR calc non Af Amer: >60 mL/min (ref >60)
Glucose, Bld: 119 mg/dL — ABNORMAL HIGH (ref 70–99)
Potassium: 3.2 mmol/L — ABNORMAL LOW (ref 3.5–5.1)
Sodium: 134 mmol/L — ABNORMAL LOW (ref 135–145)

## 2019-10-02 LAB — SARS CORONAVIRUS 2 BY RT PCR (HOSPITAL ORDER, PERFORMED IN ~~LOC~~ HOSPITAL LAB): SARS Coronavirus 2: NEGATIVE

## 2019-10-02 MED ORDER — TRAZODONE HCL 50 MG PO TABS
50.0000 mg | ORAL_TABLET | Freq: Two times a day (BID) | ORAL | Status: DC
  Administered 2019-10-02 – 2019-10-03 (×2): 50 mg via ORAL
  Filled 2019-10-02 (×2): qty 1

## 2019-10-02 MED ORDER — AMLODIPINE BESYLATE 5 MG PO TABS
5.0000 mg | ORAL_TABLET | Freq: Every day | ORAL | Status: DC
  Administered 2019-10-02 – 2019-10-03 (×2): 5 mg via ORAL
  Filled 2019-10-02 (×2): qty 1

## 2019-10-02 MED ORDER — CLONIDINE HCL 0.1 MG PO TABS
0.1000 mg | ORAL_TABLET | Freq: Three times a day (TID) | ORAL | Status: DC
  Administered 2019-10-02 – 2019-10-03 (×3): 0.1 mg via ORAL
  Filled 2019-10-02 (×3): qty 1

## 2019-10-02 MED ORDER — CARVEDILOL 6.25 MG PO TABS
6.2500 mg | ORAL_TABLET | Freq: Two times a day (BID) | ORAL | Status: DC
  Administered 2019-10-02 – 2019-10-03 (×2): 6.25 mg via ORAL
  Filled 2019-10-02 (×2): qty 1

## 2019-10-02 MED ORDER — TRAZODONE HCL 50 MG PO TABS
125.0000 mg | ORAL_TABLET | Freq: Every day | ORAL | Status: DC
  Administered 2019-10-02: 125 mg via ORAL
  Filled 2019-10-02: qty 1

## 2019-10-02 NOTE — Discharge Summary (Signed)
Physician Discharge Summary  Ryan Mayer FYB:[id number] DOB: [birth year] DOA: 09/30/2019  PCP: Lauro Regulus, MD  Admit date: 09/30/2019 Discharge date: 10/03/2019  Admitted From: Group Home Disposition: Group Home  Recommendations for Outpatient Follow-up:  1. Follow up with PCP in 1-2 weeks 2. Follow up with GI  Home Health:No Equipment/Devices:None  Discharge Condition:Stable CODE STATUS:Full Diet recommendation: Heart Healthy / Carb Modified  Brief/Interim Summary: 63 y.o.malewith medical history significant forsevere intellectual disability, epilepsy with generalized tonic-clonic seizures, HTN,baseline constipation on a daily laxative,history of ingestion of paperproducts, with ER visit back in May 2021 for diarrhea with nonacute CT abdomen and pelvis, treated as outpatientonemonth priorto admissionfor Klebsiella pneumonia UTI.He was brought to the hospital because of complaints of bloody diarrhea and ingesting paper towels. Reportedly, he has had worsening behavioral issues for the past 2 years and has had pica for the past 6 months, andhas lost about 30 pounds in the past year despite having good appetite.  CT demonstrated nonspecific colitis.  Also had AKI on admission.  No bowel movements reported since admission.  GI was consulted with no plans for inpatient endoscopic evaluation.  Hemoglobin has been stable.  Discharge Diagnoses:  Principal Problem:   Colitis with rectal bleeding Active Problems:   UTI (urinary tract infection)   AKI (acute kidney injury) (HCC)   Intellectual disability   Pica   Foreign body ingestion   HTN (hypertension), benign   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)   Hypotension   Blood in stool, frank   Diarrhea   Malnutrition of moderate degree  Acute colitis Rectal bleeding Pica/recurrent ingestion of paper products Chronic laxative use Unclear etiology No evidence of foreign body  on imaging although suppose of paper towels consumed will not show up GI is consulted with no plans for inpatient endoscopy Per nursing patient has been taking p.o. 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  Discussed with nurse.  No acute need for IV medications at this time.Molli Knock for patient not to have any  Seizure disorder Continue home Tegretol  Discharge Instructions   Allergies as of 10/03/2019   No Known Allergies     Medication List    TAKE these medications   amLODipine 5 MG tablet Commonly known as: NORVASC Take 5 mg by mouth daily.   aspirin EC 81 MG tablet Take 81 mg by mouth daily. Swallow whole.   bisacodyl 5 MG EC tablet Commonly known as: DULCOLAX Take 10 mg by mouth every 12 (twelve) hours as needed for severe constipation.   carbamazepine 200 MG tablet Commonly known as: TEGRETOL Take 200 mg by mouth 3 (three) times daily.   carbamide peroxide 6.5 % OTIC solution Commonly known as: DEBROX Place 5 drops into both ears once a week.   carvedilol 6.25 MG tablet Commonly known as: COREG  Take 6.25 mg by mouth 2 (two) times daily.   cetaphil cream Apply 1 application topically as directed. Apply after bath    cetirizine 10 MG tablet Commonly known as: ZYRTEC Take 10 mg by mouth 2 (two) times daily.   ciprofloxacin 500 MG tablet Commonly known as: CIPRO Take 1 tablet (500 mg total) by mouth 2 (two) times daily for 5 days.   cloNIDine 0.1 MG tablet Commonly known as: CATAPRES Take 0.1 mg by mouth 3 (three) times daily.   diphenhydrAMINE 25 mg capsule Commonly known as: BENADRYL Take 25 mg by mouth every 6 (six) hours as needed for itching (hives).   docusate sodium 100 MG capsule Commonly known as: COLACE Take 100 mg by mouth 3 (three) times daily.   escitalopram 10 MG tablet Commonly known as: LEXAPRO Take 10 mg by mouth daily.   fluticasone 50 MCG/ACT nasal spray Commonly known as: FLONASE Place 1 spray into both nostrils 2 (two) times daily.   guaiFENesin 100 MG/5ML Soln Commonly known as: ROBITUSSIN Take 5 mLs by mouth every 4 (four) hours as needed for cough.   hydrOXYzine 25 MG tablet Commonly known as: ATARAX/VISTARIL Take 25 mg by mouth 3 (three) times daily.   levocetirizine 5 MG tablet Commonly known as: XYZAL Take 5 mg by mouth at bedtime.   lisinopril 20 MG tablet Commonly known as: ZESTRIL Take 20 mg by mouth daily. Take with Lisinopril/HCTZ 20/12.5   lisinopril-hydrochlorothiazide 20-12.5 MG tablet Commonly known as: ZESTORETIC Take 1 tablet by mouth daily.   magnesium hydroxide 400 MG/5ML suspension Commonly known as: MILK OF MAGNESIA Take 60 mLs by mouth every Friday.   metroNIDAZOLE 0.75 % gel Commonly known as: METROGEL Apply 1 application topically at bedtime.   metroNIDAZOLE 500 MG tablet Commonly known as: FLAGYL Take 1 tablet (500 mg total) by mouth every 8 (eight) hours for 5 days.   mometasone 0.1 % cream Commonly known as: ELOCON Apply 1 application topically 2 (two) times daily as needed (rash/hives).   pantoprazole 40 MG tablet Commonly known as: PROTONIX Take 40 mg by mouth daily.   polyethylene glycol powder 17 GM/SCOOP  powder Commonly known as: GLYCOLAX/MIRALAX Take 9 g by mouth daily.   risperiDONE 0.5 MG tablet Commonly known as: RISPERDAL Take one tablet (0.5 mg) by mouth in the morning and 2 tablets (1 mg) by mouth in the evening).   selenium sulfide 1 % Lotn Commonly known as: SELSUN Apply 1 application topically 2 (two) times a week. Tuesday/Saturday   traZODone 50 MG tablet Commonly known as: DESYREL Take one tablet (50 mg) by mouth in the morning, one tablet (50 mg) by mouth at 2 pm, and 2 and a 1/2 tablets (125 mg) by mouth at bedtime.       No Known Allergies  Consultations:  GI   Procedures/Studies: DG Chest 1 View  Result Date: 09/30/2019 CLINICAL DATA:  Reportedly swallowed foreign object EXAM: CHEST  1 VIEW COMPARISON:  None. FINDINGS: Lungs are clear. Heart size and pulmonary vascularity are normal. No radiopaque foreign body evident. There is aortic atherosclerosis. No evident adenopathy. There is left-sided acromioclavicular separation. IMPRESSION: No radiopaque foreign body.  Lungs clear.  Heart size normal. Aortic Atherosclerosis (ICD10-I70.0). Acromioclavicular separation noted on the left. Electronically Signed   By: Bretta Bang III M.D.   On: 09/30/2019 14:16   DG Abdomen 1 View  Result Date: 09/30/2019 CLINICAL DATA:  Reportedly swallowed foreign body EXAM: ABDOMEN - 1 VIEW COMPARISON:  None. FINDINGS:  No radiopaque foreign body evident. No bowel dilatation or air-fluid level to suggest bowel obstruction. No free air. No abnormal calcifications. There is lumbar levoscoliosis. IMPRESSION: No evident radiopaque foreign body. No bowel obstruction or free air evident. Electronically Signed   By: Bretta Bang III M.D.   On: 09/30/2019 14:17   CT Abdomen Pelvis W Contrast  Result Date: 09/30/2019 CLINICAL DATA:  Possible foreign body with bloody diarrhea EXAM: CT ABDOMEN AND PELVIS WITH CONTRAST TECHNIQUE: Multidetector CT imaging of the abdomen and pelvis was  performed using the standard protocol following bolus administration of intravenous contrast. CONTRAST:  OMNIPAQUE IOHEXOL 300 MG/ML  SOLN COMPARISON:  05/31/2019 CT, plain film from earlier in the same day FINDINGS: Lower chest: No acute abnormality. Hepatobiliary: Liver is within normal limits. Gallbladder is well distended with dependent density similar to that seen on the prior exam consistent with small gallstones. No biliary ductal dilatation is seen. Pancreas: Unremarkable. No pancreatic ductal dilatation or surrounding inflammatory changes. Spleen: Normal in size without focal abnormality. Adrenals/Urinary Tract: The adrenal glands are within normal limits. Kidneys demonstrate a normal enhancement pattern with the exception of a few small cysts in the left kidney. Normal excretion of contrast is noted. No ureteral abnormality is seen. The bladder is decompressed with wall thickening consistent with the decompressed state. Stomach/Bowel: Diffuse edematous changes in the rectosigmoid are noted consistent with focal colitis. The more proximal colon appears within normal limits. The appendix is un remark. No small bowel abnormality is noted. No radiopaque foreign body is seen in the bowel. Vascular/Lymphatic: Aortic atherosclerosis. No enlarged abdominal or pelvic lymph nodes. Reproductive: Prostate is unremarkable. Other: No abdominal wall hernia or abnormality. No abdominopelvic ascites. Musculoskeletal: Degenerative changes of the lumbar spine are seen. IMPRESSION: Changes consistent with colitis in the rectosigmoid. No abscess formation is noted. Cholelithiasis without complicating factors. No definitive foreign body is seen. Electronically Signed   By: Alcide Clever M.D.   On: 09/30/2019 21:11   (Echo, Carotid, EGD, Colonoscopy, ERCP)    Subjective: Seen and examined.  Hb stable at time of discharge  Discharge Exam: Vitals:   10/02/19 1400 10/02/19 1817  BP: (!) 189/122   Pulse: 75 (!) 105   Resp:    Temp:    SpO2:     Vitals:   10/01/19 2020 10/02/19 0739 10/02/19 1400 10/02/19 1817  BP: 134/90 (!) 176/110 (!) 189/122   Pulse: 75 74 75 (!) 105  Resp: 18 18    Temp: 98.4 F (36.9 C) 97.7 F (36.5 C)    TempSrc: Oral Oral    SpO2: 97% 100%    Weight:      Height:        General: Pt is alert, awake, not in acute distress Cardiovascular: RRR, S1/S2 +, no rubs, no gallops Respiratory: CTA bilaterally, no wheezing, no rhonchi Abdominal: Soft, NT, ND, bowel sounds + Extremities: no edema, no cyanosis    The results of significant diagnostics from this hospitalization (including imaging, microbiology, ancillary and laboratory) are listed below for reference.     Microbiology: Recent Results (from the past 240 hour(s))  Urine Culture     Status: Abnormal   Collection Time: 09/30/19  1:26 PM   Specimen: Urine, Random  Result Value Ref Range Status   Specimen Description   Final    URINE, RANDOM Performed at [facility], [address], [place], [place] [address]    Special Requests   Final    NONE Performed  at [facility], [address] [place], [place] [address]    Culture >=100,000 COLONIES/mL KLEBSIELLA PNEUMONIAE (A)  Final   Report Status 10/03/2019 FINAL  Final   Organism ID, Bacteria KLEBSIELLA PNEUMONIAE (A)  Final      Susceptibility   Klebsiella pneumoniae - MIC*    AMPICILLIN RESISTANT Resistant     CEFAZOLIN <=4 SENSITIVE Sensitive     CEFTRIAXONE <=0.25 SENSITIVE Sensitive     CIPROFLOXACIN <=0.25 SENSITIVE Sensitive     GENTAMICIN <=1 SENSITIVE Sensitive     IMIPENEM <=0.25 SENSITIVE Sensitive     NITROFURANTOIN 64 INTERMEDIATE Intermediate     TRIMETH/SULFA <=20 SENSITIVE Sensitive     AMPICILLIN/SULBACTAM 4 SENSITIVE Sensitive     PIP/TAZO <=4 SENSITIVE Sensitive     * >=100,000 COLONIES/mL KLEBSIELLA PNEUMONIAE  SARS Coronavirus 2 by RT PCR (hospital order, performed in [facility] hospital lab) Nasopharyngeal  Nasopharyngeal Swab     Status: None   Collection Time: 09/30/19  7:10 PM   Specimen: Nasopharyngeal Swab  Result Value Ref Range Status   SARS Coronavirus 2 NEGATIVE NEGATIVE Final    Comment: (NOTE) SARS-CoV-2 target nucleic acids are NOT DETECTED.  The SARS-CoV-2 RNA is generally detectable in upper and lower respiratory specimens during the acute phase of infection. The lowest concentration of SARS-CoV-2 viral copies this assay can detect is 250 copies / mL. A negative result does not preclude SARS-CoV-2 infection and should not be used as the sole basis for treatment or other patient management decisions.  A negative result may occur with improper specimen collection / handling, submission of specimen other than nasopharyngeal swab, presence of viral mutation(s) within the areas targeted by this assay, and inadequate number of viral copies (<250 copies / mL). A negative result must be combined with clinical observations, patient history, and epidemiological information.  Fact Sheet for Patients:   BoilerBrush.com.cy  Fact Sheet for Healthcare Providers: https://pope.com/  This test is not yet approved or  cleared by the [place] FDA and has been authorized for detection and/or diagnosis of SARS-CoV-2 by FDA under an Emergency Use Authorization (EUA).  This EUA will remain in effect (meaning this test can be used) for the duration of the COVID-19 declaration under Section 564(b)(1) of the Act, 21 U.S.C. section 360bbb-3(b)(1), unless the authorization is terminated or revoked sooner.  Performed at [facility], [address] [place], [place] [address]   SARS Coronavirus 2 by RT PCR (hospital order, performed in [facility] hospital lab) Nasopharyngeal Nasopharyngeal Swab     Status: None   Collection Time: 10/02/19  6:12 PM   Specimen: Nasopharyngeal Swab  Result Value Ref Range Status   SARS Coronavirus 2  NEGATIVE NEGATIVE Final    Comment: (NOTE) SARS-CoV-2 target nucleic acids are NOT DETECTED.  The SARS-CoV-2 RNA is generally detectable in upper and lower respiratory specimens during the acute phase of infection. The lowest concentration of SARS-CoV-2 viral copies this assay can detect is 250 copies / mL. A negative result does not preclude SARS-CoV-2 infection and should not be used as the sole basis for treatment or other patient management decisions.  A negative result may occur with improper specimen collection / handling, submission of specimen other than nasopharyngeal swab, presence of viral mutation(s) within the areas targeted by this assay, and inadequate number of viral copies (<250 copies / mL). A negative result must be combined with clinical observations, patient history, and epidemiological information.  Fact Sheet for Patients:  BoilerBrush.com.cyhttps://www.fda.gov/media/136312/download  Fact Sheet for Healthcare Providers: https://pope.com/https://www.fda.gov/media/136313/download  This test is not yet approved or  cleared by the [place] FDA and has been authorized for detection and/or diagnosis of SARS-CoV-2 by FDA under an Emergency Use Authorization (EUA).  This EUA will remain in effect (meaning this test can be used) for the duration of the COVID-19 declaration under Section 564(b)(1) of the Act, 21 U.S.C. section 360bbb-3(b)(1), unless the authorization is terminated or revoked sooner.  Performed at [facility], [address] [place], [place] [address]      Labs: BNP (last 3 results) No results for input(s): BNP in the last 8760 hours. Basic Metabolic Panel: Recent Labs  Lab 09/30/19 1326 10/01/19 0524 10/02/19 0958  NA 133* 132* 134*  K 4.4 3.8 3.2*  CL 95* 99 98  CO2 27 23 24   GLUCOSE 159* 115* 119*  BUN 18 26* 11  CREATININE 1.46* 1.39* 0.78  CALCIUM 9.3 7.9* 8.8*   Liver Function Tests: Recent Labs  Lab 09/30/19 1326  AST 22  ALT 20  ALKPHOS  91  BILITOT 0.9  PROT 7.6  ALBUMIN 4.1   Recent Labs  Lab 09/30/19 1326  LIPASE 27   No results for input(s): AMMONIA in the last 168 hours. CBC: Recent Labs  Lab 09/30/19 1326 10/01/19 0524 10/02/19 0958  WBC 19.2* 9.8 6.6  NEUTROABS  --   --  5.0  HGB 16.9 12.4* 12.2*  HCT 45.9 33.9* 34.5*  MCV 86.9 89.9 90.1  PLT 271 197 189   Cardiac Enzymes: No results for input(s): CKTOTAL, CKMB, CKMBINDEX, TROPONINI in the last 168 hours. BNP: Invalid input(s): POCBNP CBG: No results for input(s): GLUCAP in the last 168 hours. D-Dimer No results for input(s): DDIMER in the last 72 hours. Hgb A1c No results for input(s): HGBA1C in the last 72 hours. Lipid Profile No results for input(s): CHOL, HDL, LDLCALC, TRIG, CHOLHDL, LDLDIRECT in the last 72 hours. Thyroid function studies No results for input(s): TSH, T4TOTAL, T3FREE, THYROIDAB in the last 72 hours.  Invalid input(s): FREET3 Anemia work up No results for input(s): VITAMINB12, FOLATE, FERRITIN, TIBC, IRON, RETICCTPCT in the last 72 hours. Urinalysis    Component Value Date/Time   COLORURINE AMBER (A) 09/30/2019 1326   APPEARANCEUR CLOUDY (A) 09/30/2019 1326   LABSPEC 1.030 09/30/2019 1326   PHURINE 5.0 09/30/2019 1326   GLUCOSEU NEGATIVE 09/30/2019 1326   HGBUR NEGATIVE 09/30/2019 1326   BILIRUBINUR SMALL (A) 09/30/2019 1326   KETONESUR 5 (A) 09/30/2019 1326   PROTEINUR 100 (A) 09/30/2019 1326   NITRITE NEGATIVE 09/30/2019 1326   LEUKOCYTESUR TRACE (A) 09/30/2019 1326   Sepsis Labs Invalid input(s): PROCALCITONIN,  WBC,  LACTICIDVEN Microbiology Recent Results (from the past 240 hour(s))  Urine Culture     Status: Abnormal   Collection Time: 09/30/19  1:26 PM   Specimen: Urine, Random  Result Value Ref Range Status   Specimen Description   Final    URINE, RANDOM Performed at [facility], [address] [place], [place] [address]    Special Requests   Final    NONE Performed at [facility], [address] [place], [place] [address]    Culture >=100,000 COLONIES/mL KLEBSIELLA PNEUMONIAE (A)  Final   Report Status 10/03/2019 FINAL  Final   Organism ID, Bacteria KLEBSIELLA PNEUMONIAE (A)  Final      Susceptibility   Klebsiella pneumoniae - MIC*    AMPICILLIN RESISTANT Resistant     CEFAZOLIN <=  4 SENSITIVE Sensitive     CEFTRIAXONE <=0.25 SENSITIVE Sensitive     CIPROFLOXACIN <=0.25 SENSITIVE Sensitive     GENTAMICIN <=1 SENSITIVE Sensitive     IMIPENEM <=0.25 SENSITIVE Sensitive     NITROFURANTOIN 64 INTERMEDIATE Intermediate     TRIMETH/SULFA <=20 SENSITIVE Sensitive     AMPICILLIN/SULBACTAM 4 SENSITIVE Sensitive     PIP/TAZO <=4 SENSITIVE Sensitive     * >=100,000 COLONIES/mL KLEBSIELLA PNEUMONIAE  SARS Coronavirus 2 by RT PCR (hospital order, performed in [facility] hospital lab) Nasopharyngeal Nasopharyngeal Swab     Status: None   Collection Time: 09/30/19  7:10 PM   Specimen: Nasopharyngeal Swab  Result Value Ref Range Status   SARS Coronavirus 2 NEGATIVE NEGATIVE Final    Comment: (NOTE) SARS-CoV-2 target nucleic acids are NOT DETECTED.  The SARS-CoV-2 RNA is generally detectable in upper and lower respiratory specimens during the acute phase of infection. The lowest concentration of SARS-CoV-2 viral copies this assay can detect is 250 copies / mL. A negative result does not preclude SARS-CoV-2 infection and should not be used as the sole basis for treatment or other patient management decisions.  A negative result may occur with improper specimen collection / handling, submission of specimen other than nasopharyngeal swab, presence of viral mutation(s) within the areas targeted by this assay, and inadequate number of viral copies (<250 copies / mL). A negative result must be combined with clinical observations, patient history, and epidemiological information.  Fact Sheet for Patients:   BoilerBrush.com.cy  Fact Sheet  for Healthcare Providers: https://pope.com/  This test is not yet approved or  cleared by the [place] FDA and has been authorized for detection and/or diagnosis of SARS-CoV-2 by FDA under an Emergency Use Authorization (EUA).  This EUA will remain in effect (meaning this test can be used) for the duration of the COVID-19 declaration under Section 564(b)(1) of the Act, 21 U.S.C. section 360bbb-3(b)(1), unless the authorization is terminated or revoked sooner.  Performed at [facility], [address] [place], [place] [address]   SARS Coronavirus 2 by RT PCR (hospital order, performed in [facility] hospital lab) Nasopharyngeal Nasopharyngeal Swab     Status: None   Collection Time: 10/02/19  6:12 PM   Specimen: Nasopharyngeal Swab  Result Value Ref Range Status   SARS Coronavirus 2 NEGATIVE NEGATIVE Final    Comment: (NOTE) SARS-CoV-2 target nucleic acids are NOT DETECTED.  The SARS-CoV-2 RNA is generally detectable in upper and lower respiratory specimens during the acute phase of infection. The lowest concentration of SARS-CoV-2 viral copies this assay can detect is 250 copies / mL. A negative result does not preclude SARS-CoV-2 infection and should not be used as the sole basis for treatment or other patient management decisions.  A negative result may occur with improper specimen collection / handling, submission of specimen other than nasopharyngeal swab, presence of viral mutation(s) within the areas targeted by this assay, and inadequate number of viral copies (<250 copies / mL). A negative result must be combined with clinical observations, patient history, and epidemiological information.  Fact Sheet for Patients:   BoilerBrush.com.cy  Fact Sheet for Healthcare Providers: https://pope.com/  This test is not yet approved or  cleared by the [place] FDA and has been authorized  for detection and/or diagnosis of SARS-CoV-2 by FDA under an Emergency Use Authorization (EUA).  This EUA will remain in effect (meaning this test can be used) for the duration of the COVID-19 declaration under Section  564(b)(1) of the Act, 21 U.S.C. section 360bbb-3(b)(1), unless the authorization is terminated or revoked sooner.  Performed at [facility], [address], [place], [place] [address]      Time coordinating discharge: Over 30 minutes  SIGNED:   Tresa Moore, MD  [facility] 10/03/2019, 9:02 AM Pager   If 7PM-7AM, please contact night-coverage

## 2019-10-02 NOTE — TOC Initial Note (Addendum)
Transition of Care [facility]) - Initial/Assessment Note    Patient Details  Name: Ryan Mayer MRN: [id number] Date of Birth: 08-08-1955  Transition of Care [facility]) CM/SW Contact:    Margarito Liner, LCSW Phone Number: 10/02/2019, 9:59 AM  Clinical Narrative:  Patient not fully oriented. CSW called patient's legal guardian, Kathalene Frames, introduced role, and explained that discharge planning would be discussed. Ms. Eliot Ford confirmed patient is from [facility]. He has lived there since 1988. Ms. Eliot Ford works at [employer] and worked directly with him so she was eventually made his guardian. Patient did not have home health or use DME prior to admission. No further concerns. CSW encouraged patient's guardian to contact CSW as needed. CSW will continue to follow patient and his guardian for support and facilitate return home when stable.                3:50 pm CSW spoke to group home coordinator, Renae Gloss. Their nurse will have to come assess patient before he can return. CSW left her a voicemail to coordinate that. Jasmine was not 100% sure that we would need to discontinue the sitter 24 hours before he discharges. She did said he will need a COVID test. MD will order.   4:36 pm: Group home nurse [facility]) will come assess patient at 10:00 tomorrow morning. MD and RN aware.  Expected Discharge Plan: Group Home Barriers to Discharge: Continued Medical Work up   Patient Goals and CMS Choice     Choice offered to / list presented to : NA  Expected Discharge Plan and Services Expected Discharge Plan: Group Home     Post Acute Care Choice: NA Living arrangements for the past 2 months: Group Home                                      Prior Living Arrangements/Services Living arrangements for the past 2 months: Group Home Lives with:: Facility Resident Patient language and need for interpreter reviewed:: Yes Do you feel safe going back to the place where you  live?: Yes      Need for Family Participation in Patient Care: Yes (Comment) Care giver support system in place?: Yes (comment)   Criminal Activity/Legal Involvement Pertinent to Current Situation/Hospitalization: No - Comment as needed  Activities of Daily Living Home Assistive Devices/Equipment: None ADL Screening (condition at time of admission) Patient's cognitive ability adequate to safely complete daily activities?: No Is the patient deaf or have difficulty hearing?: No Does the patient have difficulty seeing, even when wearing glasses/contacts?: No Does the patient have difficulty concentrating, remembering, or making decisions?: Yes Patient able to express need for assistance with ADLs?: No Does the patient have difficulty dressing or bathing?: Yes Independently performs ADLs?: No Communication: Needs assistance Dressing (OT): Needs assistance Is this a change from baseline?: Pre-admission baseline Feeding: Appropriate for developmental age Does the patient have difficulty walking or climbing stairs?: Yes Weakness of Legs: Both Weakness of Arms/Hands: None  Permission Sought/Granted Permission sought to share information with : Facility Medical sales representative, Guardian    Share Information with NAME: Kathalene Frames  Permission granted to share info w AGENCY: [name]  Permission granted to share info w Relationship: Legal Guardian  Permission granted to share info w Contact Information: [phone]  Emotional Assessment Appearance:: Appears stated age Attitude/Demeanor/Rapport: Unable to Assess Affect (typically observed): Unable to  Assess Orientation: : Oriented to Self Alcohol / Substance Use: Not Applicable Psych Involvement: No (comment)  Admission diagnosis:  Acute colitis [K52.9] Blood in stool, frank [K92.1] Urinary tract infection without hematuria, site unspecified [N39.0] Diarrhea, unspecified type [R19.7] Patient Active Problem List    Diagnosis Date Noted  . Malnutrition of moderate degree 10/02/2019  . Blood in stool, frank   . Diarrhea   . Colitis with rectal bleeding 09/30/2019  . UTI (urinary tract infection) 09/30/2019  . AKI (acute kidney injury) (HCC) 09/30/2019  . Intellectual disability 09/30/2019  . Pica 09/30/2019  . Foreign body ingestion 09/30/2019  . Hypotension 09/30/2019  . HTN (hypertension), benign 09/07/2013  . 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 [place] [address] Phone: [phone] Fax: [phone]     Social Determinants of Health (SDOH) Interventions    Readmission Risk Interventions No flowsheet data found.

## 2019-10-02 NOTE — Progress Notes (Signed)
PROGRESS NOTE    Ryan Mayer  UPJ:[id number] DOB: [birth year] DOA: 09/30/2019 PCP: Lauro Regulus, MD  Brief Narrative:  64 y.o. male with medical history significant forsevere intellectual disability, epilepsy with generalized tonic-clonic seizures, HTN,baseline constipation on a daily laxative,history of ingestion of paperproducts, with ER visit back in May 2021 for diarrhea with nonacute CT abdomen and pelvis, treated as outpatientonemonth prior to admission for Klebsiella pneumonia UTI.  He was brought to the hospital because of complaints of bloody diarrhea and ingesting paper towels.  Reportedly, he has had worsening behavioral issues for the past 2 years and has had pica for the past 6 months, and has lost about 30 pounds in the past year despite having good appetite.  CT demonstrated nonspecific colitis.  Also had AKI on admission.  No bowel movements reported since admission.  GI was consulted with no plans for inpatient endoscopic evaluation.  Hemoglobin has been stable.   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 without issue, currently on clear liquids No suspicion for sepsis at this time Plan: Continue empiric antibiotics Daily Protonix Advance to full liquids, can likely advance as tolerated Will  check stool studies if the patient is able to have a bowel movement Monitor for rectal bleeding.  Will consider EGD versus colonoscopy should diarrhea or rectal bleeding recur  Urinary tract infection Greater than 100K units of Klebsiella pneumonia Sensitivities pending Continue Cipro for now  Acute kidney injury Likely related to intravascular volume depletion Creatinine approaching baseline Continue IV fluids  Hypotension Now resolved Suspect intravascular volume depletion IV fluids Hold home blood pressure medications  Intellectual disability One-to-one sitter Continue trazodone Continue Risperdal Clonidine held due to hypotension Ativan as needed Patient is now discontinued at least 3 peripheral IVs.  Discussed with nurse.  No acute need for IV medications at this time.Molli Knock for patient not to have any  Seizure disorder Continue home Tegretol  DVT prophylaxis: SCDs Code Status: Full Family Communication: None today Disposition Plan: Status is: Inpatient  Remains inpatient appropriate because:Inpatient level of care appropriate due to severity of illness   Dispo: The patient is from: Home              Anticipated d/c is to: Home              Anticipated d/c date is: 1 day              Patient currently is not medically stable to d/c.   Unclear nature of rectal bleeding.  Patient has not had diarrhea no rectal bleeding since admission.  Hemoglobin has been stable.  Unlikely any endoscopic evaluation will be indicated during this admission.  Will advance diet to full liquids today with plans to advance further as tolerated.  If patient is tolerating p.o. diet he may  be able to discharge home within 24 hours.   Consultants:   GI  Procedures:   None  Antimicrobials:   Cipro  Flagyl   Subjective: Seen and examined.  Unable to provide any reliable history.  Objective: Vitals:   10/01/19 0116 10/01/19 0546 10/01/19 2020 10/02/19 0739  BP: 116/70 (!)  147/103 134/90 (!) 176/110  Pulse: 75 (!) 108 75 74  Resp: 20 20 18 18   Temp: 99 F (37.2 C) 97.7 F (36.5 C) 98.4 F (36.9 C) 97.7 F (36.5 C)  TempSrc: Oral Oral Oral Oral  SpO2: 99% 99% 97% 100%  Weight:      Height:        Intake/Output Summary (Last 24 hours) at 10/02/2019 1316 Last data filed at 10/02/2019 1000 Gross per 24 hour  Intake 340 ml  Output --  Net 340 ml   Filed Weights   09/30/19 1315 09/30/19 2317  Weight: 61.2 kg 58.8 kg    Examination:  General exam: No acute distress.  Appears chronically ill Respiratory system: Clear to auscultation. Respiratory effort normal. Cardiovascular system: S1 & S2 heard, RRR. No JVD, murmurs, rubs, gallops or clicks. No pedal edema. Gastrointestinal system: Scaphoid, nontender, nondistended, hyperactive bowel sounds. Central nervous system: Alert.  Severe developmental delay.  Unable to complete neurologic exam Extremities: Unable to assess Skin: No rashes, lesions or ulcers Psychiatry: Severe developmental delay   Data Reviewed: I have personally reviewed following labs and imaging studies  CBC: Recent Labs  Lab 09/30/19 1326 10/01/19 0524 10/02/19 0958  WBC 19.2* 9.8 6.6  NEUTROABS  --   --  5.0  HGB 16.9 12.4* 12.2*  HCT 45.9 33.9* 34.5*  MCV 86.9 89.9 90.1  PLT 271 197 189   Basic Metabolic Panel: Recent Labs  Lab 09/30/19 1326 10/01/19 0524 10/02/19 0958  NA 133* 132* 134*  K 4.4 3.8 3.2*  CL 95* 99 98  CO2 27 23 24   GLUCOSE 159* 115* 119*  BUN 18 26* 11  CREATININE 1.46* 1.39* 0.78  CALCIUM 9.3 7.9* 8.8*   GFR: Estimated Creatinine Clearance: 68.6 mL/min (by C-G formula based on SCr of 0.78 mg/dL). Liver Function Tests: Recent Labs  Lab 09/30/19 1326  AST 22  ALT 20  ALKPHOS 91  BILITOT 0.9  PROT 7.6  ALBUMIN 4.1   Recent Labs  Lab 09/30/19 1326  LIPASE 27   No results for input(s): AMMONIA in the last 168 hours. Coagulation Profile: No results for input(s): INR, PROTIME in  the last 168 hours. Cardiac Enzymes: No results for input(s): CKTOTAL, CKMB, CKMBINDEX, TROPONINI in the last 168 hours. BNP (last 3 results) No results for input(s): PROBNP in the last 8760 hours. HbA1C: No results for input(s): HGBA1C in the last 72 hours. CBG: No results for input(s): GLUCAP in the last 168 hours. Lipid Profile: No results for input(s): CHOL, HDL, LDLCALC, TRIG, CHOLHDL, LDLDIRECT in the last 72 hours. Thyroid Function Tests: No results for input(s): TSH, T4TOTAL, FREET4, T3FREE, THYROIDAB in the last 72 hours. Anemia Panel: No results for input(s): VITAMINB12, FOLATE, FERRITIN, TIBC, IRON, RETICCTPCT in the last 72 hours. Sepsis Labs: Recent Labs  Lab 09/30/19 2144 10/01/19 0524  LATICACIDVEN 3.0* 1.1    Recent Results (from the past 240 hour(s))  Urine Culture     Status: Abnormal (Preliminary result)   Collection Time: 09/30/19  1:26 PM   Specimen: Urine, Random  Result Value Ref Range Status   Specimen Description   Final  URINE, RANDOM Performed at [facility], [address] [place], [place] [address]    Special Requests   Final    NONE Performed at [facility], [address] [place], [place] [address]    Culture (A)  Final    >=100,000 COLONIES/mL Romie MinusGRAM NEGATIVE RODS SUSCEPTIBILITIES TO FOLLOW Performed at [facility], [address] [place], [place] [address]    Report Status PENDING  Incomplete  SARS Coronavirus 2 by RT PCR (hospital order, performed in [facility] hospital lab) Nasopharyngeal Nasopharyngeal Swab     Status: None   Collection Time: 09/30/19  7:10 PM   Specimen: Nasopharyngeal Swab  Result Value Ref Range Status   SARS Coronavirus 2 NEGATIVE NEGATIVE Final    Comment: (NOTE) SARS-CoV-2 target nucleic acids are NOT DETECTED.  The SARS-CoV-2 RNA is generally detectable in upper and lower respiratory specimens during the acute phase of infection. The lowest concentration of SARS-CoV-2 viral  copies this assay can detect is 250 copies / mL. A negative result does not preclude SARS-CoV-2 infection and should not be used as the sole basis for treatment or other patient management decisions.  A negative result may occur with improper specimen collection / handling, submission of specimen other than nasopharyngeal swab, presence of viral mutation(s) within the areas targeted by this assay, and inadequate number of viral copies (<250 copies / mL). A negative result must be combined with clinical observations, patient history, and epidemiological information.  Fact Sheet for Patients:   BoilerBrush.com.cyhttps://www.fda.gov/media/136312/download  Fact Sheet for Healthcare Providers: https://pope.com/https://www.fda.gov/media/136313/download  This test is not yet approved or  cleared by the [place] FDA and has been authorized for detection and/or diagnosis of SARS-CoV-2 by FDA under an Emergency Use Authorization (EUA).  This EUA will remain in effect (meaning this test can be used) for the duration of the COVID-19 declaration under Section 564(b)(1) of the Act, 21 U.S.C. section 360bbb-3(b)(1), unless the authorization is terminated or revoked sooner.  Performed at [facility], [address] [place], [place] [address]          Radiology Studies: DG Chest 1 View  Result Date: 09/30/2019 CLINICAL DATA:  Reportedly swallowed foreign object EXAM: CHEST  1 VIEW COMPARISON:  None. FINDINGS: Lungs are clear. Heart size and pulmonary vascularity are normal. No radiopaque foreign body evident. There is aortic atherosclerosis. No evident adenopathy. There is left-sided acromioclavicular separation. IMPRESSION: No radiopaque foreign body.  Lungs clear.  Heart size normal. Aortic Atherosclerosis (ICD10-I70.0). Acromioclavicular separation noted on the left. Electronically Signed   By: Bretta BangWilliam  Woodruff III M.D.   On: 09/30/2019 14:16   DG Abdomen 1 View  Result Date: 09/30/2019 CLINICAL DATA:   Reportedly swallowed foreign body EXAM: ABDOMEN - 1 VIEW COMPARISON:  None. FINDINGS: No radiopaque foreign body evident. No bowel dilatation or air-fluid level to suggest bowel obstruction. No free air. No abnormal calcifications. There is lumbar levoscoliosis. IMPRESSION: No evident radiopaque foreign body. No bowel obstruction or free air evident. Electronically Signed   By: Bretta BangWilliam  Woodruff III M.D.   On: 09/30/2019 14:17   CT Abdomen Pelvis W Contrast  Result Date: 09/30/2019 CLINICAL DATA:  Possible foreign body with bloody diarrhea EXAM: CT ABDOMEN AND PELVIS WITH CONTRAST TECHNIQUE: Multidetector CT imaging of the abdomen and pelvis was performed using the standard protocol following bolus administration of intravenous contrast. CONTRAST:  100mL OMNIPAQUE IOHEXOL 300 MG/ML  SOLN COMPARISON:  05/31/2019 CT, plain film from earlier in the same day  FINDINGS: Lower chest: No acute abnormality. Hepatobiliary: Liver is within normal limits. Gallbladder is well distended with dependent density similar to that seen on the prior exam consistent with small gallstones. No biliary ductal dilatation is seen. Pancreas: Unremarkable. No pancreatic ductal dilatation or surrounding inflammatory changes. Spleen: Normal in size without focal abnormality. Adrenals/Urinary Tract: The adrenal glands are within normal limits. Kidneys demonstrate a normal enhancement pattern with the exception of a few small cysts in the left kidney. Normal excretion of contrast is noted. No ureteral abnormality is seen. The bladder is decompressed with wall thickening consistent with the decompressed state. Stomach/Bowel: Diffuse edematous changes in the rectosigmoid are noted consistent with focal colitis. The more proximal colon appears within normal limits. The appendix is un remark. No small bowel abnormality is noted. No radiopaque foreign body is seen in the bowel. Vascular/Lymphatic: Aortic atherosclerosis. No enlarged abdominal or  pelvic lymph nodes. Reproductive: Prostate is unremarkable. Other: No abdominal wall hernia or abnormality. No abdominopelvic ascites. Musculoskeletal: Degenerative changes of the lumbar spine are seen. IMPRESSION: Changes consistent with colitis in the rectosigmoid. No abscess formation is noted. Cholelithiasis without complicating factors. No definitive foreign body is seen. Electronically Signed   By: Alcide Clever M.D.   On: 09/30/2019 21:11        Scheduled Meds: . amLODipine  5 mg Oral Daily  . carbamazepine  200 mg Oral TID  . carvedilol  6.25 mg Oral BID WC  . ciprofloxacin  500 mg Oral BID  . cloNIDine  0.1 mg Oral TID  . escitalopram  10 mg Oral Daily  . feeding supplement  1 Container Oral TID BM  . metroNIDAZOLE  500 mg Oral Q8H  . multivitamin with minerals  1 tablet Oral Daily  . pantoprazole  40 mg Oral Daily  . risperiDONE  0.5 mg Oral Daily  . risperiDONE  1 mg Oral QHS  . traZODone  125 mg Oral QHS  . traZODone  50 mg Oral BID   Continuous Infusions:   LOS: 2 days    Time spent: 25 minutes    Tresa Moore, MD [facility] Pager [phone]  If 7PM-7AM, please contact night-coverage 10/02/2019, 1:16 PM

## 2019-10-03 LAB — URINE CULTURE: Culture: >=100000 — AB

## 2019-10-03 MED ORDER — CIPROFLOXACIN HCL 500 MG PO TABS: 500.0000 mg | ORAL_TABLET | Freq: Two times a day (BID) | ORAL | 0 refills | Status: AC

## 2019-10-03 MED ORDER — METRONIDAZOLE 500 MG PO TABS: 500.0000 mg | ORAL_TABLET | Freq: Three times a day (TID) | ORAL | 0 refills | Status: AC

## 2019-10-03 NOTE — TOC Transition Note (Signed)
Transition of Care [facility]) - CM/SW Discharge Note   Patient Details  Name: Ryan Mayer MRN: [id number] Date of Birth: [birth year]  Transition of Care [facility]) CM/SW Contact:  Margarito Liner, LCSW Phone Number: 10/03/2019, 1:37 PM   Clinical Narrative: Patient has orders to discharge back to [facility] today. The day manager will pick him up at 2:00. RN aware. CSW left voicemail for his legal guardian. Notified her yesterday of likely plan for discharge today. She is a Haematologist member at his group home. No further concerns. CSW signing off.    Final next level of care: Group Home Barriers to Discharge: Barriers Resolved   Patient Goals and CMS Choice     Choice offered to / list presented to : NA  Discharge Placement                Patient to be transferred to facility by: Day manager at group home will pick him up.   Patient and family notified of of transfer: 10/03/19  Discharge Plan and Services     Post Acute Care Choice: NA                               Social Determinants of Health (SDOH) Interventions     Readmission Risk Interventions No flowsheet data found.

## 2019-10-03 NOTE — TOC Progression Note (Addendum)
Transition of Care [facility]) - Progression Note    Patient Details  Name: JATERRIUS RICKETSON MRN: [id number] Date of Birth: 02-15-1955  [facility]) CM/SW Contact  Margarito Liner, LCSW Phone Number: 10/03/2019, 9:36 AM  Clinical Narrative:   Group home nurse had another appointment at 10:00 so she will be here a little later to assess patient. CSW faxed FL2 and discharge summary to [facility].  10:42 am: Called and confirmed Jasmine received fax.  12:25 pm: Per RN, group home nurse came to assess him and they can accept him back today. Faxed signed FL2 to [employer]. Leavy Cella will find out a time they can come pick him up and call CSW back.  Expected Discharge Plan: Group Home Barriers to Discharge: Continued Medical Work up  Expected Discharge Plan and Services Expected Discharge Plan: Group Home     Post Acute Care Choice: NA Living arrangements for the past 2 months: Group Home                                       Social Determinants of Health (SDOH) Interventions    Readmission Risk Interventions No flowsheet data found.

## 2019-10-03 NOTE — Care Management Important Message (Signed)
Important Message  Patient Details  Name: Ryan Mayer MRN: [id number] Date of Birth: [birth year]   Medicare Important Message Given:  Yes  Initial Medicare IM given by Patient Access Associate on 10/02/2019 at 11:22am.    Johnell Comings 10/03/2019, 10:04 AM

## 2019-10-03 NOTE — [place] FL2 (Signed)
[place] MEDICAID FL2 LEVEL OF CARE SCREENING TOOL     IDENTIFICATION  Patient Name: Ryan Mayer Birthdate: 02-14-1955 Sex: male Admission Date (Current Location): 09/30/2019  [place] and [place] Number:  Chiropodist and Address:  [facility], [address], [place], [place] [address]      Provider Number: [id number]  Attending Physician Name and Address:  Tresa Moore, MD  Relative Name and Phone Number:       Current Level of Care: Hospital Recommended Level of Care: Other (Comment) [name]) Prior Approval Number:    Date Approved/Denied:   PASRR Number:    Discharge Plan: Other (Comment) [name])    Current Diagnoses: Patient Active Problem List   Diagnosis Date Noted  . Malnutrition of moderate degree 10/02/2019  . Blood in stool, frank   . Diarrhea   . Colitis with rectal bleeding 09/30/2019  . UTI (urinary tract infection) 09/30/2019  . AKI (acute kidney injury) (HCC) 09/30/2019  . Intellectual disability 09/30/2019  . Pica 09/30/2019  . Foreign body ingestion 09/30/2019  . Hypotension 09/30/2019  . HTN (hypertension), benign 09/07/2013  . 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) 09/07/2013    Orientation RESPIRATION BLADDER Height & Weight     Self  Normal Continent Weight: 129 lb 10.1 oz (58.8 kg) Height:  5' (152.4 cm)  BEHAVIORAL SYMPTOMS/MOOD NEUROLOGICAL BOWEL NUTRITION STATUS  Other (Comment) (Flat affect)  (Epilepsy with GTCS) Continent Diet (Heart healthy/carb modified)  AMBULATORY STATUS COMMUNICATION OF NEEDS Skin     Verbally Bruising                       Personal Care Assistance Level of Assistance              Functional Limitations Info  Sight, Hearing, Speech Sight Info: Adequate Hearing Info: Adequate Speech Info: Adequate    SPECIAL CARE FACTORS FREQUENCY                       Contractures  Contractures Info: Not present    Additional Factors Info  Code Status, Allergies Code Status Info: Full code Allergies Info: NKDA           Current Medications (10/03/2019):  This is the current hospital active medication list Current Facility-Administered Medications  Medication Dose Route Frequency Provider Last Rate Last Admin  . acetaminophen (TYLENOL) tablet 650 mg  650 mg Oral Q6H PRN Andris Baumann, MD       Or  . acetaminophen (TYLENOL) suppository 650 mg  650 mg Rectal Q6H PRN Andris Baumann, MD      . amLODipine (NORVASC) tablet 5 mg  5 mg Oral Daily Lowella Bandy, RPH   5 mg at 10/02/19 1431  . carbamazepine (TEGRETOL) tablet 200 mg  200 mg Oral TID Andris Baumann, MD   200 mg at 10/02/19 2035  . carvedilol (COREG) tablet 6.25 mg  6.25 mg Oral BID WC Lowella Bandy, RPH   6.25 mg at 10/03/19 0745  . ciprofloxacin (CIPRO) tablet 500 mg  500 mg Oral BID Lurene Shadow, MD   500 mg at 10/03/19 0743  . cloNIDine (CATAPRES) tablet 0.1 mg  0.1 mg Oral TID Lowella Bandy, RPH   0.1 mg at 10/02/19 2035  . escitalopram (LEXAPRO) tablet 10 mg  10 mg Oral Daily Andris Baumann, MD  10 mg at 10/02/19 0843  . feeding supplement (BOOST / RESOURCE BREEZE) liquid 1 Container  1 Container Oral TID BM Lurene Shadow, MD   1 Container at 10/02/19 2031  . haloperidol lactate (HALDOL) injection 5 mg  5 mg Intramuscular Q6H PRN Lurene Shadow, MD   5 mg at 10/01/19 1357  . LORazepam (ATIVAN) injection 0.5 mg  0.5 mg Intravenous Q4H PRN Andris Baumann, MD      . metroNIDAZOLE (FLAGYL) tablet 500 mg  500 mg Oral Q8H Lurene Shadow, MD   500 mg at 10/03/19 0743  . multivitamin with minerals tablet 1 tablet  1 tablet Oral Daily Lurene Shadow, MD   1 tablet at 10/02/19 [phone]  . ondansetron (ZOFRAN) tablet 4 mg  4 mg Oral Q6H PRN Andris Baumann, MD       Or  . ondansetron [facility]) injection 4 mg  4 mg Intravenous Q6H PRN Andris Baumann, MD      . pantoprazole (PROTONIX) EC tablet 40 mg  40  mg Oral Daily Lurene Shadow, MD   40 mg at 10/02/19 0842  . risperiDONE (RISPERDAL) tablet 0.5 mg  0.5 mg Oral Daily Lindajo Royal V, MD   0.5 mg at 10/02/19 0843  . risperiDONE (RISPERDAL) tablet 1 mg  1 mg Oral QHS Andris Baumann, MD   1 mg at 10/02/19 2032  . traZODone (DESYREL) tablet 125 mg  125 mg Oral QHS Lowella Bandy, RPH   125 mg at 10/02/19 2031  . traZODone (DESYREL) tablet 50 mg  50 mg Oral BID Lowella Bandy, RPH   50 mg at 10/03/19 5573     Discharge Medications: TAKE these medications   amLODipine 5 MG tablet Commonly known as: NORVASC Take 5 mg by mouth daily.   aspirin EC 81 MG tablet Take 81 mg by mouth daily. Swallow whole.   bisacodyl 5 MG EC tablet Commonly known as: DULCOLAX Take 10 mg by mouth every 12 (twelve) hours as needed for severe constipation.   carbamazepine 200 MG tablet Commonly known as: TEGRETOL Take 200 mg by mouth 3 (three) times daily.   carbamide peroxide 6.5 % OTIC solution Commonly known as: DEBROX Place 5 drops into both ears once a week.   carvedilol 6.25 MG tablet Commonly known as: COREG Take 6.25 mg by mouth 2 (two) times daily.   cetaphil cream Apply 1 application topically as directed. Apply after bath   cetirizine 10 MG tablet Commonly known as: ZYRTEC Take 10 mg by mouth 2 (two) times daily.   ciprofloxacin 500 MG tablet Commonly known as: CIPRO Take 1 tablet (500 mg total) by mouth 2 (two) times daily for 5 days.   cloNIDine 0.1 MG tablet Commonly known as: CATAPRES Take 0.1 mg by mouth 3 (three) times daily.   diphenhydrAMINE 25 mg capsule Commonly known as: BENADRYL Take 25 mg by mouth every 6 (six) hours as needed for itching (hives).   docusate sodium 100 MG capsule Commonly known as: COLACE Take 100 mg by mouth 3 (three) times daily.   escitalopram 10 MG tablet Commonly known as: LEXAPRO Take 10 mg by mouth daily.   fluticasone 50 MCG/ACT nasal spray Commonly known as:  FLONASE Place 1 spray into both nostrils 2 (two) times daily.   guaiFENesin 100 MG/5ML Soln Commonly known as: ROBITUSSIN Take 5 mLs by mouth every 4 (four) hours as needed for cough.   hydrOXYzine 25 MG tablet Commonly known as: ATARAX/VISTARIL Take 25  mg by mouth 3 (three) times daily.   levocetirizine 5 MG tablet Commonly known as: XYZAL Take 5 mg by mouth at bedtime.   lisinopril 20 MG tablet Commonly known as: ZESTRIL Take 20 mg by mouth daily. Take with Lisinopril/HCTZ 20/12.5   lisinopril-hydrochlorothiazide 20-12.5 MG tablet Commonly known as: ZESTORETIC Take 1 tablet by mouth daily.   magnesium hydroxide 400 MG/5ML suspension Commonly known as: MILK OF MAGNESIA Take 60 mLs by mouth every Friday.   metroNIDAZOLE 0.75 % gel Commonly known as: METROGEL Apply 1 application topically at bedtime.   metroNIDAZOLE 500 MG tablet Commonly known as: FLAGYL Take 1 tablet (500 mg total) by mouth every 8 (eight) hours for 5 days.   mometasone 0.1 % cream Commonly known as: ELOCON Apply 1 application topically 2 (two) times daily as needed (rash/hives).   pantoprazole 40 MG tablet Commonly known as: PROTONIX Take 40 mg by mouth daily.   polyethylene glycol powder 17 GM/SCOOP powder Commonly known as: GLYCOLAX/MIRALAX Take 9 g by mouth daily.   risperiDONE 0.5 MG tablet Commonly known as: RISPERDAL Take one tablet (0.5 mg) by mouth in the morning and 2 tablets (1 mg) by mouth in the evening).   selenium sulfide 1 % Lotn Commonly known as: SELSUN Apply 1 application topically 2 (two) times a week. Tuesday/Saturday   traZODone 50 MG tablet Commonly known as: DESYREL Take one tablet (50 mg) by mouth in the morning, one tablet (50 mg) by mouth at 2 pm, and 2 and a 1/2 tablets (125 mg) by mouth at bedtime.     Relevant Imaging Results:  Relevant Lab Results:   Additional Information SS#: [id number]  Margarito Liner, LCSW

## 2020-07-12 ENCOUNTER — Emergency Department: Payer: Medicare Other

## 2020-07-12 ENCOUNTER — Other Ambulatory Visit: Payer: Self-pay

## 2020-07-12 ENCOUNTER — Emergency Department
Admission: EM | Admit: 2020-07-12 | Discharge: 2020-07-13 | Disposition: A | Discharge status: Home / Self Care | Payer: Medicare Other | Attending: Emergency Medicine | Admitting: Emergency Medicine

## 2020-07-12 DIAGNOSIS — Z79899 Other long term (current) drug therapy: Secondary | ICD-10-CM | POA: Diagnosis not present

## 2020-07-12 DIAGNOSIS — W19XXXA Unspecified fall, initial encounter: Secondary | ICD-10-CM | POA: Insufficient documentation

## 2020-07-12 DIAGNOSIS — Z7982 Long term (current) use of aspirin: Secondary | ICD-10-CM | POA: Diagnosis not present

## 2020-07-12 DIAGNOSIS — I10 Essential (primary) hypertension: Secondary | ICD-10-CM | POA: Diagnosis not present

## 2020-07-12 DIAGNOSIS — S5000XA Contusion of unspecified elbow, initial encounter: Secondary | ICD-10-CM | POA: Diagnosis not present

## 2020-07-12 DIAGNOSIS — S0083XA Contusion of other part of head, initial encounter: Secondary | ICD-10-CM | POA: Diagnosis not present

## 2020-07-12 DIAGNOSIS — Y92009 Unspecified place in unspecified non-institutional (private) residence as the place of occurrence of the external cause: Secondary | ICD-10-CM | POA: Diagnosis not present

## 2020-07-12 DIAGNOSIS — T1490XA Injury, unspecified, initial encounter: Secondary | ICD-10-CM | POA: Diagnosis present

## 2020-07-12 LAB — COMPREHENSIVE METABOLIC PANEL
ALT: 15 U/L (ref 0–44)
AST: 17 U/L (ref 15–41)
Albumin: 3.6 g/dL (ref 3.5–5.0)
Alkaline Phosphatase: 83 U/L (ref 38–126)
Anion gap: 9 (ref 5–15)
BUN: 27 mg/dL — ABNORMAL HIGH (ref 8–23)
CO2: 22 mmol/L (ref 22–32)
Calcium: 8.5 mg/dL — ABNORMAL LOW (ref 8.9–10.3)
Chloride: 101 mmol/L (ref 98–111)
Creatinine, Ser: 1.49 mg/dL — ABNORMAL HIGH (ref 0.61–1.24)
GFR, Estimated: 53 mL/min — ABNORMAL LOW (ref >60)
Glucose, Bld: 123 mg/dL — ABNORMAL HIGH (ref 70–99)
Potassium: 3.3 mmol/L — ABNORMAL LOW (ref 3.5–5.1)
Sodium: 132 mmol/L — ABNORMAL LOW (ref 135–145)
Total Bilirubin: 0.6 mg/dL (ref 0.3–1.2)
Total Protein: 6.8 g/dL (ref 6.5–8.1)

## 2020-07-12 LAB — CBC
HCT: 36.8 % — ABNORMAL LOW (ref 39.0–52.0)
Hemoglobin: 13.7 g/dL (ref 13.0–17.0)
MCH: 32.7 pg (ref 26.0–34.0)
MCHC: 37.2 g/dL — ABNORMAL HIGH (ref 30.0–36.0)
MCV: 87.8 fL (ref 80.0–100.0)
Platelets: 180 10*3/uL (ref 150–400)
RBC: 4.19 MIL/uL — ABNORMAL LOW (ref 4.22–5.81)
RDW: 12.9 % (ref 11.5–15.5)
WBC: 8.2 10*3/uL (ref 4.0–10.5)
nRBC: 0 % (ref 0.0–0.2)

## 2020-07-12 MED ORDER — AMLODIPINE BESYLATE 5 MG PO TABS
5.0000 mg | ORAL_TABLET | Freq: Once | ORAL | Status: AC
  Administered 2020-07-13: 5 mg via ORAL
  Filled 2020-07-12: qty 1

## 2020-07-12 MED ORDER — CLONIDINE HCL 0.1 MG PO TABS
0.1000 mg | ORAL_TABLET | ORAL | Status: AC
  Administered 2020-07-13: 0.1 mg via ORAL
  Filled 2020-07-12: qty 1

## 2020-07-12 NOTE — ED Notes (Signed)
MD at the bedside  

## 2020-07-12 NOTE — ED Provider Notes (Signed)
[facility] Emergency Department Provider Note   ____________________________________________   Event Date/Time   First MD Initiated Contact with Patient 07/12/20 2237     (approximate)  I have reviewed the triage vital signs and the nursing notes.   HISTORY  Chief Complaint Altered Mental Status and Fall    HPI Ryan Mayer is a 65 y.o. male severe intellectual disability, epilepsy with generalized tonic-clonic seizures, HTN  Discussed with Kathalene Frames (guardian). Advises had a fall at his group home. 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 He was at [facility] eye [facility], left eye. His speech has been very thick and has some trouble communicating for a month.   Tonight sent for evaluation of fall and stating he was in some pain.   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  Prior to Admission medications   Medication Sig Start Date End Date Taking? Authorizing Provider  amLODipine (NORVASC) 5 MG tablet Take 5 mg by mouth daily. 09/10/19   [provider]  aspirin EC 81 MG tablet Take 81 mg by mouth daily. Swallow whole.    [provider]  bisacodyl (DULCOLAX) 5 MG EC tablet Take 10 mg by mouth every  12 (twelve) hours as needed for severe constipation.    [provider]  carbamazepine (TEGRETOL) 200 MG tablet Take 200 mg by mouth 3 (three) times daily. 09/10/19   [provider]  carbamide peroxide (DEBROX) 6.5 % OTIC solution Place 5 drops into both ears once a week.    [provider]  carvedilol (COREG) 6.25 MG tablet Take 6.25 mg by mouth 2 (two) times daily. 09/10/19   [provider]  cetirizine (ZYRTEC) 10 MG tablet Take 10 mg by mouth 2 (two) times daily.    [provider]  cloNIDine (CATAPRES) 0.1 MG tablet Take 0.1 mg by mouth 3 (three) times daily. 09/10/19   [provider]  diphenhydrAMINE (BENADRYL) 25 mg capsule Take 25 mg by mouth every 6 (six) hours as needed for itching (hives).    [provider]  docusate sodium (COLACE) 100 MG capsule Take 100 mg by mouth 3 (three) times daily.    [provider]  Emollient (CETAPHIL) cream Apply 1 application topically as directed. Apply after bath    [provider]  escitalopram (LEXAPRO) 10 MG tablet Take 10 mg by mouth daily. 09/10/19   [provider]  fluticasone (FLONASE) 50 MCG/ACT nasal spray Place 1 spray into both nostrils 2 (two) times daily. 09/19/19   [provider]  guaiFENesin (ROBITUSSIN) 100 MG/5ML SOLN Take 5 mLs by mouth every 4 (four) hours as needed for cough.    [provider]  hydrOXYzine (ATARAX/VISTARIL) 25 MG tablet Take 25 mg by mouth 3 (three) times daily. 09/10/19   [provider]  levocetirizine (XYZAL) 5 MG tablet Take 5 mg by mouth at bedtime.  09/10/19   [provider]  lisinopril (ZESTRIL) 20 MG tablet Take 20 mg by mouth daily. Take with Lisinopril/HCTZ 20/12.5 09/10/19   [provider]  lisinopril-hydrochlorothiazide (ZESTORETIC) 20-12.5 MG tablet Take 1 tablet by mouth daily. 09/10/19   [provider]  magnesium hydroxide (MILK OF MAGNESIA) 400 MG/5ML suspension  Take 60 mLs by mouth every Friday.    [provider]  metroNIDAZOLE (METROGEL) 0.75 % gel Apply 1 application topically at bedtime.    [provider]  mometasone (ELOCON) 0.1 % cream Apply 1 application topically 2 (two) times daily as needed (rash/hives).    [provider]  pantoprazole (PROTONIX) 40 MG tablet Take 40 mg by mouth daily. 09/10/19   [provider]  polyethylene glycol powder (GLYCOLAX/MIRALAX) 17 GM/SCOOP powder Take 9 g by mouth daily.    [provider]  risperiDONE (RISPERDAL) 0.5 MG tablet Take one tablet (0.5 mg) by mouth in the morning and 2 tablets (1 mg) by mouth in the evening).    [provider]  selenium sulfide (SELSUN) 1 % LOTN Apply 1 application topically 2 (two) times a week. Tuesday/Saturday    [provider]  traZODone (DESYREL) 50 MG tablet Take one tablet (50 mg) by mouth in the morning, one tablet (50 mg) by mouth at 2 pm, and 2 and a 1/2 tablets (125 mg) by mouth at bedtime.    [provider]    Allergies Patient has no known allergies.  No family history on file.  Social History Social History   Tobacco Use   Smoking status: Never   Smokeless tobacco: Never  Substance Use Topics   Alcohol use: Never   Drug use: Never    Review of Systems When asked questions patient seemed to just repeat "I had a bad fall".  He denies pain and does not speak of being in pain.   ____________________________________________   PHYSICAL EXAM:  VITAL SIGNS: ED Triage Vitals [07/12/20 2140]  Enc Vitals Group     BP (!) 182/116     Pulse Rate 94     Resp 20     Temp      Temp src      SpO2 95 %     Weight 129 lb 10.1 oz (58.8 kg)     Height 5' (1.524 m)     Head Circumference      Peak Flow      Pain Score      Pain Loc      Pain Edu?      Excl. in GC?     Constitutional: Alert and oriented to his name, also tells me he had a bad fall.  Otherwise not able to obtain  orientation Eyes: Conjunctivae are normal.  Anisocoria, left pupil is nonreactive and dilated appears somewhat abnormally shaped as though postsurgical changes.  Right pupil is normal reactive Head: Atraumatic except for some bruising over the mentum and upper neck without edema.  No stridor.  No hematoma.. Nose: No congestion/rhinnorhea. Mouth/Throat: Mucous membranes are moist. Neck: No stridor.  Cardiovascular: Normal rate, regular rhythm. Grossly normal heart sounds.  Good peripheral circulation. Respiratory: Normal respiratory effort.  No retractions. Lungs CTAB. Gastrointestinal: Soft and nontender. No distention. Musculoskeletal: Moves all extremities to command.  Full range of motion without pain or discomfort.  He does have bruises over the olecranon regions of each elbow, but denies any pain there is no deformity no lacerations.  Demonstrates good use of these extremities without pain.  He does seem to have about 4-5 strength in his bilateral lower extremities.  No pain to palpation.  Able to range through range of motion without pain or discomfort.  Disrobed, she is taken off socks taken off no evidence of injury to the feet ankles knees hips bilateral. Neurologic: Speaks occasionally.  Speech seems somewhat thick, but in discussing with his guardian this seems to be a chronic issue.  No gross focal neurologic deficits are appreciated though somewhat hard to appreciate detailed examination due to what appears to be intellectual disability.  Skin:  Skin is warm, dry and intact. No rash noted.  Some bruising noted on the chin and elbows bilateral Psychiatric: Mood and affect are calm, flat affect  ____________________________________________   LABS (all labs ordered are listed, but only abnormal results are displayed)  Labs Reviewed  COMPREHENSIVE METABOLIC PANEL - Abnormal; Notable for the following components:      Result Value   Sodium 132 (*)    Potassium 3.3 (*)    Glucose, Bld  123 (*)    BUN 27 (*)    Creatinine, Ser 1.49 (*)    Calcium 8.5 (*)    GFR, Estimated 53 (*)    All other components within normal limits  CBC - Abnormal; Notable for the following components:   RBC 4.19 (*)    HCT 36.8 (*)    MCHC 37.2 (*)    All other components within normal limits  URINALYSIS, COMPLETE (UACMP) WITH MICROSCOPIC  AMMONIA  CBG MONITORING, ED  CBG MONITORING, ED   ____________________________________________  EKG  Pending at signout ____________________________________________  RADIOLOGY  CT head and cervical spine pending at time of signout follow-up to Dr. York CeriseForbach ____________________________________________   PROCEDURES  Procedure(s) performed: None  Procedures  Critical Care performed: No  ____________________________________________   INITIAL IMPRESSION / ASSESSMENT AND PLAN / ED COURSE  Pertinent labs & imaging results that were available during my care of the patient were reviewed by me and considered in my medical decision making (see chart for details).   Patient presents after a fall.  Evidently unwitnessed.  Does have bruising on the chin and also elbows.  Will work-up, obtain CT head and cervical spine.  No obvious evidence of major trauma.  No obvious evidence of injury to the extremities except for bruising of the elbows but full range of motion no deformity.  Patient denies at the elbows her.  In addition there is also report of possible altered mental status, but in talking with his guardian appears some level of confusion has been present for about a month now after [facility].  I am unable to review those records in care everywhere, but guarding able to provide a good history and reports that he did have to have eye surgery.  Does not appear to have any acute ocular complaint today.  ----------------------------------------- 11:28 PM on  07/12/2020 ----------------------------------------- Guardian has one of the patient's caretakers coming to the ER to help us determine if his mental status is at baseline.  I think this will be very helpful.  Ongoing care signed Dr. York CeriseForbach, follow-up on reassessment, evaluation of baseline mental status once caretaker arrived, as well as urinalysis and CT head and  cervical spine.      ____________________________________________   FINAL CLINICAL IMPRESSION(S) / ED DIAGNOSES  Final diagnoses:  Facial contusion, initial encounter  Contusion of elbow, unspecified laterality, initial encounter        Note:  This document was prepared using Dragon voice recognition software and may include unintentional dictation errors       Sharyn Creamer, MD 07/12/20 2329

## 2020-07-12 NOTE — Discharge Instructions (Addendum)

## 2020-07-12 NOTE — ED Provider Notes (Addendum)
-----------------------------------------   11:10 PM on 07/12/2020 -----------------------------------------  Assuming care from Dr. Erma Heritage.  In short, Ryan Mayer is a 65 y.o. male with a chief complaint of AMS and fall.  Refer to the original H&P for additional details.  The current plan of care is to follow up on labs and imaging and reassess.    ----------------------------------------- 1:46 AM on 07/13/2020 -----------------------------------------  Patient has been calm and cooperative with his caregiver at bedside for several hours.  His caregiver said the patient is acting normal.  The patient has been following instructions, ambulating around the room, and the caregiver said that he is at his baseline.  He has no concerns at this time about acute illness or injury.  His labs were generally reassuring; his creatinine is slightly elevated but similar to what it has been in the past, and the caregiver said he has been eating and drinking well and he has no concerns about hydrating at home.  The patient will not cooperate with imaging but I think that the risks to have of sedation in order to obtain a CT scan and cervical spine scan when he is at extremely low risk of having acute injury means it would be better to cancel the imaging.  The patient is in no distress at this time, orients to me, in no distress, and I believe the best thing for the patient will be to get him home.  Discharging with recommendation for outpatient follow-up and I gave my usual and customary return precautions.     ED ECG REPORT I, Loleta Rose, the attending physician, personally viewed and interpreted this ECG.  Date: 07/12/2020 EKG Time: 23: 53 Rate: 82 Rhythm: normal sinus rhythm QRS Axis: normal Intervals: Intraventricular conduction delay, possible atypical right bundle branch block. ST/T Wave abnormalities: Non-specific ST segment / T-wave changes, but no clear evidence of acute ischemia. Narrative  Interpretation: no definitive evidence of acute ischemia; does not meet STEMI criteria.  Artifact is present due to the patient moving around but the EKG is generally reassuring.    Loleta Rose, MD 07/13/20 Geronimo Boot    Loleta Rose, MD 07/13/20 [phone]

## 2020-07-12 NOTE — ED Notes (Addendum)
Patient return from CT, unable to perform CT due to pt movement. Assisted to the toilet by EDT x 2

## 2020-07-12 NOTE — ED Notes (Addendum)
Bed alarm in place.

## 2020-07-12 NOTE — ED Triage Notes (Signed)
Pt arrived via [facility] from a group home [facility] with reports of fall and altered mental status.  Pt has hx of seizures and IDD.  On arrival to ED pt uncooperative would not stay in recliner frequently trying to get out. Unable to redirect at times.

## 2020-07-13 DIAGNOSIS — S0083XA Contusion of other part of head, initial encounter: Secondary | ICD-10-CM | POA: Diagnosis not present

## 2020-07-13 NOTE — ED Notes (Signed)
Group home staff at the bedside. MD instructed to hold off on starting an IV, obtaining any further bloodwork, and placing monitors on the pt. Requested that this RN speak to group home staff regarding patient's baseline behavior. Patient pulls off BP cuff and pulse ox with each attempt to place.

## 2020-07-13 NOTE — ED Notes (Signed)
Per group home staff, patient is at his baseline and is not exhibiting any abnormal behaviors. Will make MD aware.

## 2020-07-15 ENCOUNTER — Emergency Department: Payer: Medicare Other

## 2020-07-15 ENCOUNTER — Emergency Department
Admission: EM | Admit: 2020-07-15 | Discharge: 2020-07-15 | Disposition: A | Discharge status: Home / Self Care | Payer: Medicare Other | Attending: Emergency Medicine | Admitting: Emergency Medicine

## 2020-07-15 ENCOUNTER — Other Ambulatory Visit: Payer: Self-pay

## 2020-07-15 DIAGNOSIS — W19XXXA Unspecified fall, initial encounter: Secondary | ICD-10-CM

## 2020-07-15 DIAGNOSIS — I69328 Other speech and language deficits following cerebral infarction: Secondary | ICD-10-CM | POA: Insufficient documentation

## 2020-07-15 DIAGNOSIS — Y92199 Unspecified place in other specified residential institution as the place of occurrence of the external cause: Secondary | ICD-10-CM | POA: Diagnosis not present

## 2020-07-15 DIAGNOSIS — Z20822 Contact with and (suspected) exposure to covid-19: Secondary | ICD-10-CM | POA: Insufficient documentation

## 2020-07-15 DIAGNOSIS — Z79899 Other long term (current) drug therapy: Secondary | ICD-10-CM | POA: Diagnosis not present

## 2020-07-15 DIAGNOSIS — Z7982 Long term (current) use of aspirin: Secondary | ICD-10-CM | POA: Insufficient documentation

## 2020-07-15 DIAGNOSIS — I10 Essential (primary) hypertension: Secondary | ICD-10-CM | POA: Diagnosis not present

## 2020-07-15 DIAGNOSIS — W1830XA Fall on same level, unspecified, initial encounter: Secondary | ICD-10-CM | POA: Insufficient documentation

## 2020-07-15 DIAGNOSIS — R4781 Slurred speech: Secondary | ICD-10-CM | POA: Diagnosis present

## 2020-07-15 LAB — COMPREHENSIVE METABOLIC PANEL
ALT: 18 U/L (ref 0–44)
AST: 18 U/L (ref 15–41)
Albumin: 3.9 g/dL (ref 3.5–5.0)
Alkaline Phosphatase: 90 U/L (ref 38–126)
Anion gap: 8 (ref 5–15)
BUN: 23 mg/dL (ref 8–23)
CO2: 27 mmol/L (ref 22–32)
Calcium: 8.7 mg/dL — ABNORMAL LOW (ref 8.9–10.3)
Chloride: 98 mmol/L (ref 98–111)
Creatinine, Ser: 1.04 mg/dL (ref 0.61–1.24)
GFR, Estimated: >60 mL/min (ref >60)
Glucose, Bld: 124 mg/dL — ABNORMAL HIGH (ref 70–99)
Potassium: 3.7 mmol/L (ref 3.5–5.1)
Sodium: 133 mmol/L — ABNORMAL LOW (ref 135–145)
Total Bilirubin: 0.5 mg/dL (ref 0.3–1.2)
Total Protein: 7.4 g/dL (ref 6.5–8.1)

## 2020-07-15 LAB — RESP PANEL BY RT-PCR (FLU A&B, COVID) ARPGX2
Influenza A by PCR: NEGATIVE
Influenza B by PCR: NEGATIVE
SARS Coronavirus 2 by RT PCR: NEGATIVE

## 2020-07-15 LAB — CBC WITH DIFFERENTIAL/PLATELET
Abs Immature Granulocytes: 0.03 10*3/uL (ref 0.00–0.07)
Basophils Absolute: 0 10*3/uL (ref 0.0–0.1)
Basophils Relative: 1 %
Eosinophils Absolute: 0.2 10*3/uL (ref 0.0–0.5)
Eosinophils Relative: 2 %
HCT: 37.5 % — ABNORMAL LOW (ref 39.0–52.0)
Hemoglobin: 13.7 g/dL (ref 13.0–17.0)
Immature Granulocytes: 0 %
Lymphocytes Relative: 14 %
Lymphs Abs: 1.1 10*3/uL (ref 0.7–4.0)
MCH: 32.3 pg (ref 26.0–34.0)
MCHC: 36.5 g/dL — ABNORMAL HIGH (ref 30.0–36.0)
MCV: 88.4 fL (ref 80.0–100.0)
Monocytes Absolute: 0.8 10*3/uL (ref 0.1–1.0)
Monocytes Relative: 10 %
Neutro Abs: 5.4 10*3/uL (ref 1.7–7.7)
Neutrophils Relative %: 73 %
Platelets: 195 10*3/uL (ref 150–400)
RBC: 4.24 MIL/uL (ref 4.22–5.81)
RDW: 12.6 % (ref 11.5–15.5)
WBC: 7.4 10*3/uL (ref 4.0–10.5)
nRBC: 0 % (ref 0.0–0.2)

## 2020-07-15 LAB — MAGNESIUM: Magnesium: 1.9 mg/dL (ref 1.7–2.4)

## 2020-07-15 LAB — TROPONIN I (HIGH SENSITIVITY)
Troponin I (High Sensitivity): 4 ng/L (ref <18)
Troponin I (High Sensitivity): 3 ng/L (ref <18)

## 2020-07-15 MED ORDER — DIPHENHYDRAMINE HCL 50 MG/ML IJ SOLN
INTRAMUSCULAR | Status: AC
  Administered 2020-07-15: 50 mg via INTRAMUSCULAR
  Filled 2020-07-15: qty 1

## 2020-07-15 MED ORDER — IOHEXOL 350 MG/ML SOLN
75.0000 mL | Freq: Once | INTRAVENOUS | Status: AC | PRN
  Administered 2020-07-15: 75 mL via INTRAVENOUS

## 2020-07-15 MED ORDER — HALOPERIDOL LACTATE 5 MG/ML IJ SOLN
2.5000 mg | Freq: Once | INTRAMUSCULAR | Status: AC
  Administered 2020-07-15: 2.5 mg via INTRAMUSCULAR

## 2020-07-15 MED ORDER — KETAMINE HCL 10 MG/ML IJ SOLN
INTRAMUSCULAR | Status: AC
  Filled 2020-07-15: qty 1

## 2020-07-15 MED ORDER — MIDAZOLAM HCL 2 MG/2ML IJ SOLN
1.0000 mg | Freq: Once | INTRAMUSCULAR | Status: AC
  Administered 2020-07-15: 1 mg via INTRAMUSCULAR

## 2020-07-15 MED ORDER — MIDAZOLAM HCL 2 MG/2ML IJ SOLN
INTRAMUSCULAR | Status: AC
  Filled 2020-07-15: qty 2

## 2020-07-15 MED ORDER — DIPHENHYDRAMINE HCL 50 MG/ML IJ SOLN: 50.0000 mg | Freq: Once | INTRAMUSCULAR | Status: AC

## 2020-07-15 MED ORDER — HALOPERIDOL LACTATE 5 MG/ML IJ SOLN
INTRAMUSCULAR | Status: AC
  Filled 2020-07-15: qty 1

## 2020-07-15 MED ORDER — RISPERIDONE 1 MG PO TABS: 1.0000 mg | ORAL_TABLET | ORAL | Status: DC

## 2020-07-15 MED ORDER — KETAMINE HCL 10 MG/ML IJ SOLN
2.0000 mg/kg | Freq: Once | INTRAMUSCULAR | Status: AC
  Administered 2020-07-15: 118 mg via INTRAVENOUS

## 2020-07-15 NOTE — ED Notes (Signed)
Pt to CT with RN and ED tech, on cardiac monitor and pulse ox monitor.

## 2020-07-15 NOTE — ED Notes (Signed)
Pt back from CT at this time 

## 2020-07-15 NOTE — ED Triage Notes (Signed)
Pt arrives via [facility] from adult day care, resides [facility]. Staff at daycare called out for fall/unconscious. EMS reports pt was sitting in a chair and alert when they arrived. Pt is alert upon arrival to ED, ambulatory from ems stretcher to bedside chair in ED room. Only able to redirect pt for short periods of time, pt refuses to stay in ED stretcher - recliner placed at bedside with bed alarm and ED staff remains with patient.

## 2020-07-15 NOTE — Discharge Instructions (Addendum)
CT Head and Neck today showed: CT head: 1. No evidence of acute large vascular territory infarct or acute hemorrhage. MRI could provide more sensitive evaluation for acute infarct if clinically indicated. 2. 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 3. Similar remote infarcts in the high right parietal lobe and right frontal lobe.   CTA: 1. No large vessel occlusion or proximal hemodynamically significant stenosis in the head or neck. 2. Largely ground-glass opacities in bilateral lung apices, which could be related to expiratory imaging/atelectasis; however, pneumonia is not excluded.  Chest XR not consistent with pneumonia.

## 2020-07-15 NOTE — ED Notes (Signed)
Attempted to call Toniann Fail - legal guardian, at [phone] and [phone], no answer. Also attempted to call Harvey Lions at [phone].

## 2020-07-15 NOTE — ED Provider Notes (Signed)
[facility] Emergency Department Provider Note  ____________________________________________   Event Date/Time   First MD Initiated Contact with Patient 07/15/20 0957     (approximate)  I have reviewed the triage vital signs and the nursing notes.   HISTORY  Chief Complaint Fall   HPI Ryan Mayer is a 65 y.o. male with PMH of developmental delay, HTN, HLD, seizure disorder and recent evaluation after a fall at his group home on 7/3 and questionable diagnosis of recent left eye "optic stroke" about a month ago was had about a month of some worsening confusion thicker speech than usual who presents via EMS from his group home after he reportedly had a witnessed fall.  Patient is unable to provide any history on arrival secondary to his baseline intellectual disability.  EMS is including additional history.  Was able to reach a staff who witnessed this although I was able to reach patient's legal guardian who had her patient had fallen onto his knees and did not lose consciousness or have a seizure.  She is unable provide any other history regarding events today.         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 No pertinent surgical history.  Prior to Admission medications   Medication Sig Start Date End Date Taking? Authorizing Provider  acetaminophen (TYLENOL) 325  MG tablet Take 650 mg by mouth every 4 (four) hours as needed for fever.   Yes [provider]  acetaZOLAMIDE ER (DIAMOX) 500 MG capsule Take 500 mg by mouth every other day.   Yes [provider]  alum & mag hydroxide-simeth (MAALOX/MYLANTA) 200-200-20 MG/5ML suspension Take 30 mLs by mouth every 4 (four) hours as needed for indigestion or heartburn.   Yes [provider]  amLODipine (NORVASC) 5 MG tablet Take 5 mg by mouth daily. 09/10/19  Yes [provider]  aspirin EC 81 MG tablet Take 81 mg by mouth daily. Swallow whole.   Yes [provider]  bisacodyl (DULCOLAX) 5 MG EC tablet Take 10 mg by mouth every 12 (twelve) hours as needed for severe constipation.   Yes [provider]  brimonidine (ALPHAGAN) 0.2 % ophthalmic solution Place 1 drop into the left eye 3 (three) times daily.   Yes [provider]  camphor-menthol Wynelle Fanny(SARNA) lotion Apply 1 application topically as needed for itching.   Yes [provider]  carbamazepine (TEGRETOL) 200 MG tablet Take 200 mg by mouth 3 (three) times daily. 09/10/19  Yes [provider]  carbamide peroxide (DEBROX) 6.5 % OTIC solution Place 5-10 drops into both ears every Friday.   Yes [provider]  carvedilol (COREG) 25 MG tablet Take 25 mg by mouth 2 (two) times daily with a meal.   Yes [provider]  cetirizine (ZYRTEC) 10 MG tablet Take 10 mg by mouth 2 (two) times  daily.   Yes [provider]  chlorhexidine (PERIDEX) 0.12 % solution Use as directed 5 mLs in the mouth or throat 2 (two) times daily.   Yes [provider]  docusate sodium (COLACE) 100 MG capsule Take 100 mg by mouth 3 (three) times daily as needed for mild constipation or moderate constipation.   Yes [provider]  dorzolamide-timolol (COSOPT) 22.3-6.8 MG/ML ophthalmic solution Place 1 drop into the left eye 2 (two) times daily.   Yes [provider]   Emollient (CETAPHIL) cream Apply 1 application topically as directed. Apply after bath   Yes [provider]  escitalopram (LEXAPRO) 10 MG tablet Take 10 mg by mouth daily. 09/10/19  Yes [provider]  fesoterodine (TOVIAZ) 8 MG TB24 tablet Take 8 mg by mouth daily.   Yes [provider]  fluticasone (FLONASE) 50 MCG/ACT nasal spray Place 1 spray into both nostrils 2 (two) times daily. 09/19/19  Yes [provider]  guaiFENesin (ROBITUSSIN) 100 MG/5ML SOLN Take 5 mLs by mouth every 4 (four) hours as needed for cough.   Yes [provider]  latanoprost (XALATAN) 0.005 % ophthalmic solution Place 1 drop into the left eye at bedtime.   Yes [provider]  levocetirizine (XYZAL) 5 MG tablet Take 5 mg by mouth at bedtime.  09/10/19  Yes [provider]  lisinopril (ZESTRIL) 20 MG tablet Take 20 mg by mouth daily. (Take with Lisinopril/HCTZ 20/12.5 tablet)   Yes [provider]  lisinopril-hydrochlorothiazide (ZESTORETIC) 20-12.5 MG tablet Take 1 tablet by mouth daily. (Take with  Lisinopril tablet)   Yes [provider]  loperamide (IMODIUM) 2 MG capsule Take 2-4 mg by mouth See admin instructions. Take 2 capsules ( ) by mouth after first loose stool then take 1 capsule ( ) by mouth after each additional loose stool (PRN)   Yes [provider]  LORazepam (ATIVAN) 1 MG tablet Take 1 mg by mouth daily as needed for anxiety or sedation.   Yes [provider]  LORazepam (ATIVAN) 1 MG tablet Take 1 mg by mouth in the morning and at bedtime. (1700 & 2000)   Yes [provider]  metroNIDAZOLE (METROCREAM) 0.75 % cream Apply 1 application topically at bedtime. (Apply to face)   Yes [provider]  naltrexone (DEPADE) 50 MG tablet Take 50 mg by mouth daily.   Yes [provider]  neomycin-bacitracin-polymyxin (NEOSPORIN) ointment Apply 1 application topically daily as needed for  wound care.   Yes [provider]  pantoprazole (PROTONIX) 40 MG tablet Take 40 mg by mouth daily. 09/10/19  Yes [provider]  polyethylene glycol (MIRALAX / GLYCOLAX) 17 g packet Take 17 g by mouth daily.   Yes [provider]  prednisoLONE acetate (PRED FORTE) 1 % ophthalmic suspension Place 1 drop into the left eye 6 (six) times daily.   Yes [provider]  risperiDONE (RISPERDAL) 1 MG tablet Take 0.5-1 mg by mouth See admin instructions. Take 1 tablet ( ) by mouth every morning, take  tablet (0.5mg ) by mouth in the afternoon and take 1 tablet ( ) by mouth at bedtime   Yes [provider]  tolnaftate (TINACTIN) 1 % cream Apply 1 application topically 2 (two) times daily as needed (itchy feet).   Yes [provider]    Allergies Patient has no known allergies.  History reviewed. No pertinent family history.  Social History Social History   Tobacco Use   Smoking status: Never  Smokeless tobacco: Never  Substance Use Topics   Alcohol use: Never   Drug use: Never    Review of Systems  Review of Systems  Unable to perform ROS: Medical condition     ____________________________________________   PHYSICAL EXAM:  VITAL SIGNS: ED Triage Vitals  Enc Vitals Group     BP      Pulse      Resp      Temp      Temp src      SpO2      Weight      Height      Head Circumference      Peak Flow      Pain Score      Pain Loc      Pain Edu?      Excl. in GC?    Vitals:   07/15/20 1200 07/15/20 1300  BP: 129/89 (!) 159/97  Pulse: 61 89  Resp: 13 17  Temp:    SpO2: 100% 96%   Physical Exam Vitals and nursing note reviewed.  Constitutional:      General: He is not in acute distress. HENT:     Head: Normocephalic and atraumatic.     Right Ear: External ear normal.     Left Ear: External ear normal.     Nose: Nose normal.  Eyes:     Extraocular Movements: Extraocular movements intact.      Conjunctiva/sclera: Conjunctivae normal.  Cardiovascular:     Rate and Rhythm: Normal rate and regular rhythm.     Pulses: Normal pulses.  Pulmonary:     Effort: Pulmonary effort is normal. No respiratory distress.     Breath sounds: No stridor. No wheezing, rhonchi or rales.  Chest:     Chest wall: No tenderness.  Abdominal:     General: There is no distension.     Tenderness: There is no abdominal tenderness. There is no guarding.  Musculoskeletal:        General: No deformity.     Right lower leg: No edema.     Left lower leg: No edema.  Skin:    Capillary Refill: Capillary refill takes less than 2 seconds.  Neurological:     Mental Status: He is disoriented.    Patient does not follow any directions or participate in exam.  He is moving his extremities with symmetric strength and has 2+ radial pulses.  No obvious deformities.  He has noticed trauma to his face scalp forehead.  His lungs are clear bilaterally his abdomen is soft.  He does not follow any directions and attempts to pushes examiner away on further exam of his oropharynx and eyes. ____________________________________________   LABS (all labs ordered are listed, but only abnormal results are displayed)  Labs Reviewed  CBC WITH DIFFERENTIAL/PLATELET - Abnormal; Notable for the following components:      Result Value   HCT 37.5 (*)    MCHC 36.5 (*)    All other components within normal limits  COMPREHENSIVE METABOLIC PANEL - Abnormal; Notable for the following components:   Sodium 133 (*)    Glucose, Bld 124 (*)    Calcium 8.7 (*)    All other components within normal limits  RESP PANEL BY RT-PCR (FLU A&B, COVID) ARPGX2  MAGNESIUM  TROPONIN I (HIGH SENSITIVITY)  TROPONIN I (HIGH SENSITIVITY)   ____________________________________________  EKG  Sinus rhythm with a ventricular to 71, right bundle branch block, unremarkable intervals nonspecific ST change in V3  without any other clearance of acute ischemia or  significant Arrhythmia. ____________________________________________  RADIOLOGY  ED MD interpretation: Chest x-ray shows low lung volumes without evidence of overt edema, good focal consolidation, rib fracture, pneumothorax or other clear acute intrathoracic process.  CTA head and neck shows no evidence of large vessel occlusion or acute intracranial hemorrhage.  There is evidence of remote infarcts in the right parietal lobe and right frontal lobe as well as a left caudate.  No evidence of acute CVA.  Official radiology report(s): CT ANGIO HEAD NECK W WO CM  Result Date: 07/15/2020 CLINICAL DATA:  Stroke/TIA. EXAM: CT ANGIOGRAPHY HEAD AND NECK TECHNIQUE: Multidetector CT imaging of the head and neck was performed using the standard protocol during bolus administration of intravenous contrast. Multiplanar CT image reconstructions and MIPs were obtained to evaluate the vascular anatomy. Carotid stenosis measurements (when applicable) are obtained utilizing NASCET criteria, using the distal internal carotid diameter as the denominator. CONTRAST:  75mL OMNIPAQUE IOHEXOL 350 MG/ML SOLN COMPARISON:  CT head 07/24/2011. FINDINGS: CT HEAD FINDINGS Brain: Similar encephalomalacia in the high right parietal lobe. Similar ex vacuo ventricular dilation of the adjacent right lateral ventricle. Similar additional remote infarct in the right frontal lobe. Interval infarct in the left caudate, which is remote appearing. Additional mild patchy white matter hypoattenuation, likely related to chronic microvascular ischemic disease. No acute hemorrhage. No hydrocephalus. Vascular: See below. Skull: No acute fracture. Sinuses: Visualized sinuses are clear. Orbits: No acute findings. Other: No mastoid effusions. Soft tissue in the left external auditory canal, nonspecific but most likely cerumen. Review of the MIP images confirms the above findings CTA NECK FINDINGS Aortic arch: Great vessel origins are patent. Right carotid  system: No evidence of dissection, stenosis (50% or greater) or occlusion. Left carotid system: No evidence of dissection, stenosis (50% or greater) or occlusion. Vertebral arteries: Codominant. No evidence of dissection, stenosis (50% or greater) or occlusion. Mild narrowing of the right vertebral artery origin secondary to calcific atherosclerosis. Skeleton: Multilevel degenerative change of the cervical spine. Other neck: No acute abnormality. Upper chest: Largely ground-glass opacities in bilateral lung apices. Review of the MIP images confirms the above findings CTA HEAD FINDINGS Anterior circulation: Bilateral intracranial ICAs, MCAs, and ACAs are patent without hemodynamically significant stenosis. Posterior circulation: Bilateral intradural vertebral arteries, basilar artery and posterior cerebral artery is are patent without proximal hemodynamically significant stenosis. No aneurysm identified. Venous sinuses: As permitted by contrast timing, patent. Review of the MIP images confirms the above findings IMPRESSION: CT head: 1. No evidence of acute large vascular territory infarct or acute hemorrhage. MRI could provide more sensitive evaluation for acute infarct if clinically indicated. 2. 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 3. Similar remote infarcts in the high right parietal lobe and right frontal lobe. CTA: 1. No large vessel occlusion or proximal hemodynamically significant stenosis in the head or neck. 2. Largely ground-glass opacities in bilateral lung apices, which could be related to expiratory imaging/atelectasis; however, pneumonia is not excluded. Electronically Signed   By: Feliberto Harts MD   On: 07/15/2020 12:10   DG Chest Portable 1 View  Result Date: 07/15/2020 CLINICAL DATA:  Fall, syncope EXAM: PORTABLE CHEST 1 VIEW COMPARISON:  09/30/2019 FINDINGS: The heart size and mediastinal contours are within normal limits. Low volume AP portable  examination with prominent interstitial markings. The visualized skeletal structures are unremarkable. IMPRESSION: Low volume AP portable examination with prominent interstitial markings, which may reflect mild edema but  are likely exaggerated by low volume AP portable technique. No focal airspace opacity. Electronically Signed   By: Lauralyn Primes M.D.   On: 07/15/2020 11:23    ____________________________________________   PROCEDURES  Procedure(s) performed (including Critical Care):  .Sedation  Date/Time: 07/15/2020 11:51 AM Performed by: Gilles Chiquito, MD Authorized by: Gilles Chiquito, MD   Consent:    Consent obtained:  Verbal   Consent given by:  Guardian   Risks discussed:  Allergic reaction, dysrhythmia, inadequate sedation, nausea, vomiting, respiratory compromise necessitating ventilatory assistance and intubation, prolonged sedation necessitating reversal and prolonged hypoxia resulting in organ damage   Alternatives discussed:  Anxiolysis Universal protocol:    Immediately prior to procedure, a time out was called: no   Indications:    Procedure performed:  Imaging studies   Procedure necessitating sedation performed by:  Physician performing sedation Pre-sedation assessment:    Time since last food or drink:  Unable to specify   NPO status caution: unable to specify NPO status     ASA classification: class 2 - patient with mild systemic disease     Mallampati score:  I - soft palate, uvula, fauces, pillars visible   Neck mobility: normal     Pre-sedation assessments completed and reviewed: airway patency, cardiovascular function, hydration status, mental status, nausea/vomiting, pain level, respiratory function and temperature   Immediate pre-procedure details:    Reviewed: vital signs, relevant labs/tests and NPO status     Verified: bag valve mask available, emergency equipment available, intubation equipment available, IV patency confirmed, oxygen available and  suction available   Procedure details (see MAR for exact dosages):    Preoxygenation:  Room air   Sedation:  Ketamine   Intended level of sedation: deep   Analgesia:  None   Intra-procedure monitoring:  Blood pressure monitoring, cardiac monitor, continuous pulse oximetry, continuous capnometry, frequent LOC assessments and frequent vital sign checks   Intra-procedure events: hypoxia     Intra-procedure management:  Supplemental oxygen   Total Provider sedation time (minutes):  15 Post-procedure details:    Attendance: Constant attendance by certified staff until patient recovered     Recovery: Patient returned to pre-procedure baseline     Post-sedation assessments completed and reviewed: airway patency, cardiovascular function, hydration status, mental status, nausea/vomiting, pain level, respiratory function and temperature     Patient is stable for discharge or admission: yes     Procedure completion:  Tolerated well, no immediate complications .1-3 Lead EKG Interpretation  Date/Time: 07/15/2020 4:08 PM Performed by: Gilles Chiquito, MD Authorized by: Gilles Chiquito, MD     Interpretation: normal     ECG rate assessment: normal     Rhythm: sinus rhythm     Ectopy: none     Conduction: normal     ____________________________________________   INITIAL IMPRESSION / ASSESSMENT AND PLAN / ED COURSE      Patient presents with above-stated history exam after reported witnessed fall at his group home earlier today.  This is in the setting of fall couple days ago with somewhat unclear circumstances and history of recent optic stroke although I am unable to see any documentation of this and legal guardian is not aware of the specific details.  Patient's left eye is larger than his right and I do not see any recent head imaging including his last ED visit a couple days ago.  On arrival he is afebrile hemodynamically stable.  It seems per legal guardian that he has been  a little more  confused than usual although is not typically oriented able to provide a participate in any history.  He does not follow commands and there is no obvious evidence of trauma on exam.  Seems patient did not have a seizure today although is unclear if he had a syncopal or near syncopal episode.  Additional differential is quite broad and includes CVA, ACS, arrhythmia, metabolic derangements,, PE, and polypharmacy seizure felt less likely at this time.  Patient given some Haldol and Versed on arrival as he was repeatedly pushing away examiner's and not allowing anyone to obtain vitals.  However he is still awake and not following directions after this.  He was thus sedated with ketamine to obtain CTA imaging of the head and neck to assess for any evidence of CVA or aneurysm cranial hemorrhage will explain his reported decline in mental status the last couple weeks and asymmetric pupil is unable to see any clear documentation of this "optic stroke".  Consent obtained from legal guardian.  Ketamine given without difficulty although timeout had to be deferred as this examiner and multiple staff including several RNs were attempting to hold patient still and prevent him from ripping out his IV while he was getting ketamine.  He was immediately placed on cardiac monitor and pulse ox.  He was given 2 L of oxygen throughout his sedation with return to normal pulse ox is upon sedation wearing off.  Low suspicion for PE as patient is noted to have hypoxia, tachycardia, Tachypnea or clear risk factors.  He does have some scattered bruising noted under his chin and elbows also noted on notation from prior ED visit without other evidence of clear acute trauma.  He does seem neurovascular intact in his extremities as he is moving extremities with symmetric strength in withdrawal small noxious stimuli and symmetric pulses.  Patient is able to bear weight although does not walk or directed.  Suspicion for occult hip fracture  and chest x-ray has no evidence of rib fracture or pneumothorax pneumonia or heart failure.  ECG and nonelevated troponin x2 are not suggestive of ischemia or precipitating arrhythmia for fall earlier today.  CBC without leukocytosis or acute anemia.  CMP without significant electrolyte or metabolic derangements.  Magnesium is unremarkable.  COVID and influenza test is negative.  Chest x-ray has evidence of low lung volumes but no clear overt edema, pneumothorax, significant effusion, focal consolidation or other clear acute intrathoracic process.  CTA head and neck obtained remarkable for no evidence of large vessel occlusion or acute intracranial hemorrhage.  There is evidence of remote infarcts in the right parietal lobe and right frontal lobe as well as a left caudate.  No evidence of acute CVA.  Overall unclear etiology for patient's reported fall earlier today although given he has returned back to it seems to been his baseline over the last month after sedation from ketamine with no evidence of clear acute CVA or other immediate life-threatening process I think he is stable for discharge with close outpatient neurology and PCP follow-up.  Explained this to his legal guardian who is amenable to plan.  Discharged stable condition.  Strict return precautions advised and provided in writing.   ____________________________________________   FINAL CLINICAL IMPRESSION(S) / ED DIAGNOSES  Final diagnoses:  Fall, initial encounter  CVA, old, speech/language deficit    Medications  risperiDONE (RISPERDAL) tablet 1 mg (1 mg Oral Not Given 07/15/20 1347)  haloperidol lactate (HALDOL) injection 2.5 mg (2.5 mg Intramuscular Given  07/15/20 1031)  midazolam (VERSED) injection 1 mg (1 mg Intramuscular Given 07/15/20 1030)  haloperidol lactate (HALDOL) injection 2.5 mg (2.5 mg Intramuscular Given 07/15/20 1054)  midazolam (VERSED) injection 1 mg (1 mg Intramuscular Given 07/15/20 1053)  diphenhydrAMINE (BENADRYL)  injection 50 mg (50 mg Intramuscular Given 07/15/20 1053)  ketamine (KETALAR) injection 118 mg (118 mg Intravenous Given 07/15/20 1115)  iohexol (OMNIPAQUE) 350 MG/ML injection 75 mL (75 mLs Intravenous Contrast Given 07/15/20 1127)     ED Discharge Orders     None        Note:  This document was prepared using Dragon voice recognition software and may include unintentional dictation errors.    Gilles Chiquito, MD 07/15/20 [phone]

## 2020-07-15 NOTE — ED Notes (Signed)
Dr Michiel Sites able to get in contact with legal guardian, Toniann Fail.

## 2020-09-07 ENCOUNTER — Emergency Department: Payer: Medicare Other

## 2020-09-07 ENCOUNTER — Other Ambulatory Visit: Payer: Self-pay

## 2020-09-07 ENCOUNTER — Emergency Department
Admission: EM | Admit: 2020-09-07 | Discharge: 2020-09-07 | Disposition: A | Discharge status: Home / Self Care | Payer: Medicare Other | Attending: Emergency Medicine | Admitting: Emergency Medicine

## 2020-09-07 DIAGNOSIS — Z79899 Other long term (current) drug therapy: Secondary | ICD-10-CM | POA: Insufficient documentation

## 2020-09-07 DIAGNOSIS — Z7982 Long term (current) use of aspirin: Secondary | ICD-10-CM | POA: Insufficient documentation

## 2020-09-07 DIAGNOSIS — W050XXA Fall from non-moving wheelchair, initial encounter: Secondary | ICD-10-CM | POA: Diagnosis not present

## 2020-09-07 DIAGNOSIS — I10 Essential (primary) hypertension: Secondary | ICD-10-CM | POA: Diagnosis not present

## 2020-09-07 DIAGNOSIS — S0512XA Contusion of eyeball and orbital tissues, left eye, initial encounter: Secondary | ICD-10-CM | POA: Diagnosis not present

## 2020-09-07 DIAGNOSIS — S0511XA Contusion of eyeball and orbital tissues, right eye, initial encounter: Secondary | ICD-10-CM | POA: Diagnosis not present

## 2020-09-07 DIAGNOSIS — I2699 Other pulmonary embolism without acute cor pulmonale: Secondary | ICD-10-CM | POA: Insufficient documentation

## 2020-09-07 DIAGNOSIS — S0990XA Unspecified injury of head, initial encounter: Secondary | ICD-10-CM | POA: Insufficient documentation

## 2020-09-07 DIAGNOSIS — S0591XA Unspecified injury of right eye and orbit, initial encounter: Secondary | ICD-10-CM | POA: Diagnosis present

## 2020-09-07 DIAGNOSIS — W19XXXA Unspecified fall, initial encounter: Secondary | ICD-10-CM

## 2020-09-07 DIAGNOSIS — S40021A Contusion of right upper arm, initial encounter: Secondary | ICD-10-CM

## 2020-09-07 LAB — BASIC METABOLIC PANEL
Anion gap: 10 (ref 5–15)
BUN: 18 mg/dL (ref 8–23)
CO2: 22 mmol/L (ref 22–32)
Calcium: 8.7 mg/dL — ABNORMAL LOW (ref 8.9–10.3)
Chloride: 98 mmol/L (ref 98–111)
Creatinine, Ser: 0.94 mg/dL (ref 0.61–1.24)
GFR, Estimated: >60 mL/min (ref >60)
Glucose, Bld: 131 mg/dL — ABNORMAL HIGH (ref 70–99)
Potassium: 3.9 mmol/L (ref 3.5–5.1)
Sodium: 130 mmol/L — ABNORMAL LOW (ref 135–145)

## 2020-09-07 LAB — HEPATIC FUNCTION PANEL
ALT: 15 U/L (ref 0–44)
AST: 17 U/L (ref 15–41)
Albumin: 3.9 g/dL (ref 3.5–5.0)
Alkaline Phosphatase: 87 U/L (ref 38–126)
Bilirubin, Direct: <0.1 mg/dL (ref 0.0–0.2)
Total Bilirubin: 0.6 mg/dL (ref 0.3–1.2)
Total Protein: 7.3 g/dL (ref 6.5–8.1)

## 2020-09-07 LAB — URINALYSIS, COMPLETE (UACMP) WITH MICROSCOPIC
Bilirubin Urine: NEGATIVE
Glucose, UA: NEGATIVE mg/dL
Hgb urine dipstick: NEGATIVE
Ketones, ur: NEGATIVE mg/dL
Leukocytes,Ua: NEGATIVE
Nitrite: NEGATIVE
Protein, ur: NEGATIVE mg/dL
Specific Gravity, Urine: 1.016 (ref 1.005–1.030)
pH: 6 (ref 5.0–8.0)

## 2020-09-07 LAB — CBC
HCT: 38.3 % — ABNORMAL LOW (ref 39.0–52.0)
Hemoglobin: 14.2 g/dL (ref 13.0–17.0)
MCH: 34.1 pg — ABNORMAL HIGH (ref 26.0–34.0)
MCHC: 37.1 g/dL — ABNORMAL HIGH (ref 30.0–36.0)
MCV: 91.8 fL (ref 80.0–100.0)
Platelets: 235 10*3/uL (ref 150–400)
RBC: 4.17 MIL/uL — ABNORMAL LOW (ref 4.22–5.81)
RDW: 12.7 % (ref 11.5–15.5)
WBC: 6.6 10*3/uL (ref 4.0–10.5)
nRBC: 0 % (ref 0.0–0.2)

## 2020-09-07 LAB — TROPONIN I (HIGH SENSITIVITY)
Troponin I (High Sensitivity): 5 ng/L (ref <18)
Troponin I (High Sensitivity): 6 ng/L (ref <18)

## 2020-09-07 MED ORDER — LORAZEPAM 2 MG/ML IJ SOLN
2.0000 mg | Freq: Once | INTRAMUSCULAR | Status: AC
  Administered 2020-09-07: 2 mg via INTRAMUSCULAR
  Filled 2020-09-07: qty 1

## 2020-09-07 MED ORDER — IOHEXOL 350 MG/ML SOLN
75.0000 mL | Freq: Once | INTRAVENOUS | Status: AC | PRN
  Administered 2020-09-07: 75 mL via INTRAVENOUS

## 2020-09-07 MED ORDER — KETAMINE HCL 10 MG/ML IJ SOLN: 2.0000 mg/kg | Freq: Once | INTRAMUSCULAR | Status: DC

## 2020-09-07 MED ORDER — HALOPERIDOL LACTATE 5 MG/ML IJ SOLN
5.0000 mg | Freq: Once | INTRAMUSCULAR | Status: AC
  Administered 2020-09-07: 5 mg via INTRAMUSCULAR
  Filled 2020-09-07: qty 1

## 2020-09-07 MED ORDER — KETAMINE HCL 50 MG/ML IJ SOLN
4.0000 mg/kg | Freq: Once | INTRAMUSCULAR | Status: DC
  Filled 2020-09-07: qty 5.5

## 2020-09-07 MED ORDER — KETAMINE HCL 50 MG/5ML IJ SOSY
2.0000 mg/kg | PREFILLED_SYRINGE | Freq: Once | INTRAMUSCULAR | Status: AC
  Administered 2020-09-07: 138 mg via INTRAVENOUS

## 2020-09-07 MED ORDER — LACTATED RINGERS IV BOLUS: 1000.0000 mL | Freq: Once | INTRAVENOUS | Status: DC

## 2020-09-07 NOTE — ED Notes (Signed)
2 caregivers with pt to help with care. Pt not cooperative. Able to get VS, EKG, 1 lavender tube

## 2020-09-07 NOTE — ED Notes (Addendum)
Pt to ED POV from group home due to recent fall. Pt caregiver at bedside, and states that he fell while getting ready this morning. Pt appears to have bruising and swelling to L eye.   Pt caregiver states he does have falls often, and no new onset of confusion at this time.

## 2020-09-07 NOTE — ED Notes (Signed)
[facility] verified medication dosage and waste at bedside. Timeout performed with MD Katrinka Blazing at bedside, this RN at bedside, A Automotive engineer at bedside, Carlis Abbott RN at bedside during timeout. Pt connected to ETCO2 monitor and O2 monitor and in bed. CT called to confirm room available before administration.

## 2020-09-07 NOTE — ED Notes (Signed)
Called XR at this time. Explained that pt is able to follow directions and pt may be able to get XR done if one of the caregivers stays in the room.

## 2020-09-07 NOTE — Discharge Instructions (Addendum)
Please have Ryan Mayer be seen for any high fevers, chest pain, shortness of breath, change in behavior, persistent vomiting, bloody stool or any other new or concerning symptoms.

## 2020-09-07 NOTE — ED Notes (Signed)
Pt on monitor and transported by assigned RN's to CT at this time. MD Katrinka Blazing at bedside for transport at this time.

## 2020-09-07 NOTE — ED Triage Notes (Signed)
Pt to ED with caregiver Pt has developmental delays. Not allowing VS to be taken at this time or blood work/EKG, not able to communicate effectively.  Bruising and swelling noted to left eye  Caregiver working on finding legal guardian contact info   Caregiver states heard him fall, pt uses a walker. Pt was up walking around after fall when caregiver found. Caregiver reports frequent falls.   Hx seizures  Pt repeatedly taking off shirt in triage.

## 2020-09-07 NOTE — ED Notes (Signed)
Legal guardian wendy briggs notified by this rn about discharge from the ER.

## 2020-09-07 NOTE — ED Provider Notes (Signed)
[facility] Emergency Department Provider Note  ____________________________________________   Event Date/Time   First MD Initiated Contact with Patient 09/07/20 1219     (approximate)  I have reviewed the triage vital signs and the nursing notes.   HISTORY  Chief Complaint Fall   HPI Ryan Mayer is a 65 y.o. male  with PMH of developmental delay, HTN, HLD, seizure disorder, and prior CVAs as well as recurrent falls who presents from his group home with caregivers after reportedly heard patient fall in her room.  This was unwitnessed.  Patient is unable provide any history secondary to some baseline nonverbal status and developmental delay.  Per caregivers they noticed fairly significant bruising around his left eye after the fall.  He has not otherwise had a fall in the last couple days and has not had any cough, fevers, vomiting, diarrhea or other sick symptoms as far as they are aware.      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  Prior to Admission medications   Medication Sig Start Date End Date Taking? Authorizing Provider  acetaminophen (TYLENOL) 325 MG tablet Take 650 mg by mouth every 4 (four) hours as needed for fever.    [provider]  acetaZOLAMIDE ER (DIAMOX) 500 MG capsule Take 500 mg by mouth every other day.    [provider]  alum & mag hydroxide-simeth (MAALOX/MYLANTA) 200-200-20 MG/5ML suspension Take 30 mLs by mouth every 4 (four) hours as needed for indigestion or heartburn.    [provider]  amLODipine (NORVASC) 5 MG tablet Take 5 mg by mouth daily. 09/10/19   [provider]  aspirin EC 81 MG tablet Take 81 mg by mouth daily. Swallow whole.    [provider]  bisacodyl (DULCOLAX) 5 MG EC tablet Take 10 mg by mouth every 12 (twelve) hours as needed for severe constipation.    [provider]  brimonidine (ALPHAGAN) 0.2 % ophthalmic solution Place 1 drop into the left eye 3 (three) times daily.    [provider]  camphor-menthol Wynelle Fanny) lotion Apply 1 application topically as needed for itching.    [provider]  carbamazepine (TEGRETOL) 200 MG tablet Take 200 mg by mouth 3 (three) times daily. 09/10/19   [provider]  carbamide peroxide (DEBROX) 6.5 % OTIC solution Place 5-10 drops into both ears every Friday.    [provider]  carvedilol (COREG) 25 MG tablet Take 25 mg by mouth 2 (two) times daily with a meal.    [provider]  cetirizine (ZYRTEC) 10 MG tablet Take 10 mg by mouth 2 (two) times daily.    [provider]  chlorhexidine (PERIDEX) 0.12 % solution Use as directed 5 mLs in the mouth or throat 2 (two) times daily.    [provider]  docusate sodium (COLACE) 100 MG capsule Take 100 mg by mouth 3 (three) times daily as needed for mild constipation or moderate constipation.    [provider]  dorzolamide-timolol (COSOPT) 22.3-6.8 MG/ML ophthalmic solution Place 1 drop into the left eye 2 (two) times daily.    [provider]  Emollient (CETAPHIL) cream Apply 1 application topically as directed. Apply after bath    [provider]  escitalopram (LEXAPRO) 10 MG tablet Take 10 mg by mouth daily. 09/10/19   [provider]  fesoterodine (TOVIAZ) 8 MG  TB24 tablet Take 8 mg by mouth daily.    [provider]  fluticasone (FLONASE) 50 MCG/ACT nasal spray Place 1 spray into both nostrils 2 (two) times daily. 09/19/19   [provider]  guaiFENesin (ROBITUSSIN) 100 MG/5ML SOLN Take 5 mLs by mouth every 4 (four) hours as needed for cough.    [provider]  latanoprost (XALATAN) 0.005 % ophthalmic solution Place 1 drop into the left eye at bedtime.    [provider]  levocetirizine (XYZAL) 5 MG tablet Take 5 mg by mouth at bedtime.  09/10/19   [provider]  lisinopril (ZESTRIL) 20 MG tablet Take 20 mg by mouth daily. (Take with Lisinopril/HCTZ 20/12.5 tablet)    [provider]  lisinopril-hydrochlorothiazide (ZESTORETIC) 20-12.5 MG tablet Take 1 tablet by mouth daily. (Take with 20mg  Lisinopril tablet)    [provider]  loperamide (IMODIUM) 2 MG capsule Take 2-4 mg by mouth See admin instructions. Take 2 capsules (4mg ) by mouth after first loose stool then take 1 capsule (2mg ) by mouth after each additional loose stool (PRN)    [provider]  LORazepam (ATIVAN) 1 MG tablet Take 1 mg by mouth daily as needed for anxiety or sedation.    [provider]  LORazepam (ATIVAN) 1 MG tablet Take 1 mg by mouth in the morning and at bedtime. (1700 & 2000)    [provider]  metroNIDAZOLE (METROCREAM) 0.75 % cream Apply 1 application topically at bedtime. (Apply to face)    [provider]  naltrexone (DEPADE) 50 MG tablet Take 50 mg by mouth daily.    [provider]  neomycin-bacitracin-polymyxin (NEOSPORIN) ointment Apply 1 application topically daily as needed for wound care.    [provider]  pantoprazole (PROTONIX) 40 MG tablet Take 40 mg by mouth daily. 09/10/19   [provider]  polyethylene glycol (MIRALAX / GLYCOLAX) 17 g packet Take 17 g by mouth daily.    [provider]  prednisoLONE acetate (PRED FORTE) 1 %  ophthalmic suspension Place 1 drop into the left eye 6 (six) times daily.    [provider]  risperiDONE (RISPERDAL) 1 MG tablet Take 0.5-1 mg by mouth See admin instructions. Take 1 tablet (1mg ) by mouth every morning, take  tablet (0.5mg ) by mouth in the afternoon and take 1 tablet (1mg ) by mouth at bedtime    [provider]  tolnaftate (TINACTIN) 1 % cream Apply 1 application topically 2 (two) times daily as needed (itchy feet).    [provider]    Allergies Patient has no known allergies.  No family history on file.  Social History Social History   Tobacco Use   Smoking status: Never   Smokeless tobacco: Never  Substance Use Topics   Alcohol use: Never   Drug use: Never    Review of Systems  Review of Systems  Unable to perform ROS: Patient nonverbal  ____________________________________________   PHYSICAL EXAM:  VITAL SIGNS: ED Triage Vitals [09/07/20 1113]  Enc Vitals Group     BP (!) 183/121     Pulse Rate 86     Resp 18     Temp 98 F (36.7 C)     Temp src      SpO2 93 %     Weight      Height      Head Circumference      Peak Flow      Pain Score      Pain Loc      Pain Edu?      Excl. in GC?    Vitals:   09/07/20 1113  BP: (!) 183/121  Pulse: 86  Resp: 18  Temp: 98 F (36.7 C)  SpO2: 93%   Physical Exam Vitals and nursing note reviewed.  Constitutional:      Appearance: He is well-developed.  HENT:     Head: Normocephalic.     Right Ear: External ear normal.     Left Ear: External ear normal.     Nose: Nose normal.  Eyes:     Conjunctiva/sclera: Conjunctivae normal.  Cardiovascular:     Rate and Rhythm: Normal rate and regular rhythm.     Heart sounds: No murmur heard. Pulmonary:     Effort: Pulmonary effort is normal. No respiratory distress.     Breath sounds: Normal breath sounds.  Abdominal:     Palpations: Abdomen is soft.     Tenderness: There is no abdominal tenderness.   Musculoskeletal:     Cervical back: Neck supple.  Skin:    General: Skin is warm and dry.     Capillary Refill: Capillary refill takes less than 2 seconds.  Neurological:     Mental Status: He is alert and oriented to person, place, and time.  Psychiatric:        Mood and Affect: Mood normal.    No apparent tenderness step-offs or deformities over the C/T/L-spine.  Patient has fairly significant edema and ecchymosis around the left eye.  There is no proptosis.  Left eye appears slightly larger than the right approximately 4 to 5 mm compared to 3 mm.  They are both reactive to light.  Oropharynx is unremarkable.  There are some bruising over the dorsum of the right wrist and right humerus.  No other significant trauma with bilateral upper extremities or significant point tenderness.  No significant tenderness of the chest abdomen or otherwise about the back face scalp head or neck.  Patient is able to bear slight weight on both lower extremities and was observed to ambulate with the use of a walker.  There is no large effusion deformity or tenderness about the hips, knees or ankles.  Patient does not otherwise participate in neurological exam or answer any direct questions. ____________________________________________   LABS (all labs ordered are listed, but only abnormal results are displayed)  Labs Reviewed  BASIC METABOLIC PANEL - Abnormal; Notable for the following components:      Result Value   Sodium 130 (*)    Glucose, Bld 131 (*)    Calcium 8.7 (*)    All other components within normal limits  CBC - Abnormal; Notable for the following components:   RBC 4.17 (*)    HCT 38.3 (*)    MCH 34.1 (*)    MCHC 37.1 (*)    All other components within normal limits  URINALYSIS, COMPLETE (UACMP) WITH  MICROSCOPIC  HEPATIC FUNCTION PANEL  TROPONIN I (HIGH SENSITIVITY)  TROPONIN I (HIGH SENSITIVITY)   ____________________________________________  EKG  Sinus rhythm with a ventricular  rate of 97, nonspecific ST changes versus artifact in the inferior leads without clearance of acute ischemia. ____________________________________________  RADIOLOGY  ED MD interpretation:    Official radiology report(s): No results found.  ____________________________________________   PROCEDURES  Procedure(s) performed (including Critical Care):  .Sedation  Date/Time: 09/07/2020 3:41 PM Performed by: Gilles Chiquito, MD Authorized by: Gilles Chiquito, MD   Consent:    Consent obtained:  Verbal   Consent given by:  Guardian   Risks discussed:  Nausea, inadequate sedation, dysrhythmia, allergic reaction, vomiting and respiratory compromise necessitating ventilatory assistance and intubation Universal protocol:    Immediately prior to procedure, a time out was called: yes     Patient identity confirmed:  Arm band Indications:    Procedure performed:  Imaging studies Pre-sedation assessment:    Time since last food or drink:  Unable to specify   NPO status caution: unable to specify NPO status     ASA classification: class 2 - patient with mild systemic disease     Mouth opening:  2 finger widths   Mallampati score:  II - soft palate, uvula, fauces visible   Neck mobility: normal     Pre-sedation assessments completed and reviewed: airway patency, cardiovascular function, hydration status, mental status, nausea/vomiting, pain level, respiratory function and temperature   Immediate pre-procedure details:    Reassessment: Patient reassessed immediately prior to procedure     Reviewed: vital signs, relevant labs/tests and NPO status     Verified: bag valve mask available, intubation equipment available, IV patency confirmed, oxygen available, reversal medications available and suction available   Procedure details (see MAR for exact dosages):    Preoxygenation:  Room air   Sedation:  Ketamine   Intended level of sedation: deep   Analgesia:  None   Intra-procedure  monitoring:  Blood pressure monitoring, cardiac monitor, continuous pulse oximetry, frequent LOC assessments and frequent vital sign checks   Intra-procedure events: none     Total Provider sedation time (minutes):  15 Post-procedure details:    Attendance: Constant attendance by certified staff until patient recovered     Post-sedation assessments completed and reviewed: airway patency, cardiovascular function, hydration status, mental status, nausea/vomiting, pain level, respiratory function and temperature     Patient is stable for discharge or admission: yes     Procedure completion:  Tolerated well, no immediate complications   ____________________________________________   INITIAL IMPRESSION / ASSESSMENT AND PLAN / ED COURSE      Patient presents with above-stated history and exam for assessment after unwitnessed fall.  He is accompanied by caregivers.  He is unable provide any history on arrival secondary to baseline developmental delay and borderline nonverbal status.  On arrival he is hypertensive with a BP of 183/121 with otherwise stable vital signs on room air.  He does have some ecchymosis of his right humerus and right wrist as well as significant ecchymosis edema around his left eye no other obvious evidence of trauma.  Differential includes mechanical fall, CVA, seizure versus syncope possible arrhythmia, metabolic derangement, PE, anemia, or dehydration.  ECG with some nonspecific changes but given nonelevated troponin obtained greater than 3 hours after reported fall have a low suspicion for ACS or myocarditis.  No significant arrhythmias identified.  CBC without leukocytosis or acute anemia.  BMP remarkable for sodium of 130 compared  to 133 1 month ago without any other clear acute electrolyte or metabolic derangement.  Given chronic nature of hyponatremia I have a lower suspicion that this is the acute etiology of her fall.  Patient will aggressively push any examiner  including this examiner away from him on time to close her exam of the left eye and unfortunately unable to lay still or follow directions to obtain CT imaging.  I did attempt to give him some calming medicines IM including Haldol and Ativan although unfortunately he has had nearly 0 affect.  Discussed with his legal guardian Kathalene Frames who consented for ketamine sedation in order for patient to undergo CT imaging of his face, head, neck and chest to assess for possible skull fractures, intracranial hemorrhage PE or other acute thoracic process contributing to potential fall.  Plan is to follow-up these imaging studies as well as repeat troponin hepatic function panel and UA.  We will give patient a liter of IV fluids while undergoing this work-up.        ____________________________________________   FINAL CLINICAL IMPRESSION(S) / ED DIAGNOSES  Final diagnoses:  Fall, initial encounter  Eye bruise, left, initial encounter  Arm bruise, right, initial encounter    Medications  lactated ringers bolus 1,000 mL (has no administration in time range)  LORazepam (ATIVAN) injection 2 mg (2 mg Intramuscular Given 09/07/20 1304)  haloperidol lactate (HALDOL) injection 5 mg (5 mg Intramuscular Given 09/07/20 1303)  ketamine 50 mg in normal saline 5 mL (10 mg/mL) syringe (138 mg Intravenous Given 09/07/20 1526)  iohexol (OMNIPAQUE) 350 MG/ML injection 75 mL (75 mLs Intravenous Contrast Given 09/07/20 1529)     ED Discharge Orders     None        Note:  This document was prepared using Dragon voice recognition software and may include unintentional dictation errors.    Gilles Chiquito, MD 09/07/20 [phone]

## 2020-09-07 NOTE — ED Provider Notes (Signed)
Imaging and follow up troponin without concerning findings. UA not consistent with infection. Will plan on discharging.   Phineas Semen, MD 09/07/20 [phone]

## 2020-09-07 NOTE — ED Notes (Signed)
Caregiver signed esignature for d/c inst.

## 2020-09-29 ENCOUNTER — Emergency Department: Payer: Medicare Other

## 2020-09-29 ENCOUNTER — Inpatient Hospital Stay
Admission: EM | Admit: 2020-09-29 | Discharge: 2020-10-02 | DRG: 682 | Disposition: A | Discharge status: Home / Self Care | Payer: Medicare Other | Source: Ambulatory Visit | Attending: Family Medicine | Admitting: Family Medicine

## 2020-09-29 ENCOUNTER — Other Ambulatory Visit: Payer: Self-pay

## 2020-09-29 DIAGNOSIS — E871 Hypo-osmolality and hyponatremia: Secondary | ICD-10-CM | POA: Diagnosis present

## 2020-09-29 DIAGNOSIS — Z7982 Long term (current) use of aspirin: Secondary | ICD-10-CM

## 2020-09-29 DIAGNOSIS — S300XXA Contusion of lower back and pelvis, initial encounter: Secondary | ICD-10-CM | POA: Diagnosis present

## 2020-09-29 DIAGNOSIS — E861 Hypovolemia: Secondary | ICD-10-CM | POA: Diagnosis present

## 2020-09-29 DIAGNOSIS — G40409 Other generalized epilepsy and epileptic syndromes, not intractable, without status epilepticus: Secondary | ICD-10-CM | POA: Diagnosis present

## 2020-09-29 DIAGNOSIS — B961 Klebsiella pneumoniae [K. pneumoniae] as the cause of diseases classified elsewhere: Secondary | ICD-10-CM | POA: Diagnosis present

## 2020-09-29 DIAGNOSIS — Y929 Unspecified place or not applicable: Secondary | ICD-10-CM

## 2020-09-29 DIAGNOSIS — N179 Acute kidney failure, unspecified: Secondary | ICD-10-CM | POA: Diagnosis not present

## 2020-09-29 DIAGNOSIS — N3090 Cystitis, unspecified without hematuria: Secondary | ICD-10-CM | POA: Diagnosis present

## 2020-09-29 DIAGNOSIS — R0602 Shortness of breath: Secondary | ICD-10-CM | POA: Diagnosis not present

## 2020-09-29 DIAGNOSIS — F79 Unspecified intellectual disabilities: Secondary | ICD-10-CM

## 2020-09-29 DIAGNOSIS — E876 Hypokalemia: Secondary | ICD-10-CM | POA: Diagnosis present

## 2020-09-29 DIAGNOSIS — I959 Hypotension, unspecified: Secondary | ICD-10-CM | POA: Diagnosis present

## 2020-09-29 DIAGNOSIS — W19XXXA Unspecified fall, initial encounter: Secondary | ICD-10-CM | POA: Diagnosis present

## 2020-09-29 DIAGNOSIS — E860 Dehydration: Secondary | ICD-10-CM | POA: Diagnosis present

## 2020-09-29 DIAGNOSIS — F72 Severe intellectual disabilities: Secondary | ICD-10-CM | POA: Diagnosis present

## 2020-09-29 DIAGNOSIS — G40909 Epilepsy, unspecified, not intractable, without status epilepticus: Secondary | ICD-10-CM

## 2020-09-29 DIAGNOSIS — T189XXA Foreign body of alimentary tract, part unspecified, initial encounter: Secondary | ICD-10-CM | POA: Diagnosis present

## 2020-09-29 DIAGNOSIS — I10 Essential (primary) hypertension: Secondary | ICD-10-CM | POA: Diagnosis present

## 2020-09-29 DIAGNOSIS — F419 Anxiety disorder, unspecified: Secondary | ICD-10-CM | POA: Diagnosis present

## 2020-09-29 DIAGNOSIS — F819 Developmental disorder of scholastic skills, unspecified: Secondary | ICD-10-CM | POA: Diagnosis present

## 2020-09-29 DIAGNOSIS — G9341 Metabolic encephalopathy: Secondary | ICD-10-CM | POA: Diagnosis present

## 2020-09-29 DIAGNOSIS — T508X5A Adverse effect of diagnostic agents, initial encounter: Secondary | ICD-10-CM | POA: Diagnosis present

## 2020-09-29 DIAGNOSIS — Z20822 Contact with and (suspected) exposure to covid-19: Secondary | ICD-10-CM | POA: Diagnosis present

## 2020-09-29 DIAGNOSIS — R4587 Impulsiveness: Secondary | ICD-10-CM | POA: Diagnosis present

## 2020-09-29 DIAGNOSIS — K5900 Constipation, unspecified: Secondary | ICD-10-CM | POA: Diagnosis present

## 2020-09-29 DIAGNOSIS — R7989 Other specified abnormal findings of blood chemistry: Secondary | ICD-10-CM

## 2020-09-29 DIAGNOSIS — Z79899 Other long term (current) drug therapy: Secondary | ICD-10-CM

## 2020-09-29 LAB — CBC
HCT: 31.7 % — ABNORMAL LOW (ref 39.0–52.0)
Hemoglobin: 11.5 g/dL — ABNORMAL LOW (ref 13.0–17.0)
MCH: 33.8 pg (ref 26.0–34.0)
MCHC: 36.3 g/dL — ABNORMAL HIGH (ref 30.0–36.0)
MCV: 93.2 fL (ref 80.0–100.0)
Platelets: 153 10*3/uL (ref 150–400)
RBC: 3.4 MIL/uL — ABNORMAL LOW (ref 4.22–5.81)
RDW: 12.7 % (ref 11.5–15.5)
WBC: 13.2 10*3/uL — ABNORMAL HIGH (ref 4.0–10.5)
nRBC: 0 % (ref 0.0–0.2)

## 2020-09-29 LAB — COMPREHENSIVE METABOLIC PANEL
ALT: 20 U/L (ref 0–44)
AST: 30 U/L (ref 15–41)
Albumin: 3.3 g/dL — ABNORMAL LOW (ref 3.5–5.0)
Alkaline Phosphatase: 62 U/L (ref 38–126)
Anion gap: 12 (ref 5–15)
BUN: 50 mg/dL — ABNORMAL HIGH (ref 8–23)
CO2: 23 mmol/L (ref 22–32)
Calcium: 8 mg/dL — ABNORMAL LOW (ref 8.9–10.3)
Chloride: 95 mmol/L — ABNORMAL LOW (ref 98–111)
Creatinine, Ser: 4.61 mg/dL — ABNORMAL HIGH (ref 0.61–1.24)
GFR, Estimated: 14 mL/min — ABNORMAL LOW (ref >60)
Glucose, Bld: 117 mg/dL — ABNORMAL HIGH (ref 70–99)
Potassium: 3.1 mmol/L — ABNORMAL LOW (ref 3.5–5.1)
Sodium: 130 mmol/L — ABNORMAL LOW (ref 135–145)
Total Bilirubin: 0.6 mg/dL (ref 0.3–1.2)
Total Protein: 6.5 g/dL (ref 6.5–8.1)

## 2020-09-29 LAB — RESP PANEL BY RT-PCR (FLU A&B, COVID) ARPGX2
Influenza A by PCR: NEGATIVE
Influenza B by PCR: NEGATIVE
SARS Coronavirus 2 by RT PCR: NEGATIVE

## 2020-09-29 MED ORDER — SODIUM CHLORIDE 0.9 % IV BOLUS
1000.0000 mL | Freq: Once | INTRAVENOUS | Status: AC
Start: 2020-09-29 — End: 2020-09-30
  Administered 2020-09-30: 1000 mL via INTRAVENOUS

## 2020-09-29 MED ORDER — LORAZEPAM 2 MG/ML IJ SOLN
4.0000 mg | Freq: Once | INTRAMUSCULAR | Status: AC
  Administered 2020-09-29: 4 mg via INTRAMUSCULAR
  Filled 2020-09-29: qty 2

## 2020-09-29 NOTE — ED Triage Notes (Addendum)
Pt comes with c/o AMS. Pt with caregiver at this time. Pt was seen at [facility] and labs completed. Pt hypotensive. BP meds held today.  Current BP-93/65  Pt apparently was lethargic yesterday too. Pt's caregiver feels pt has been slurring words and off balance.

## 2020-09-29 NOTE — ED Notes (Signed)
Pt has to be sedated with Ketamine to perform CT

## 2020-09-29 NOTE — ED Notes (Signed)
Meds given.  Pt cooperative.  Caregiver with pt  pt in wheelchair

## 2020-09-29 NOTE — ED Notes (Signed)
Caregiver at bedside. Discussed with caregiver patient's need for x-ray and IV. Per caregiver, patient does not tolerate IV's or bandages and states 'he won't leave that on, you might as well not bother". MD notified.

## 2020-09-29 NOTE — ED Provider Notes (Signed)
Emergency Medicine Provider Triage Evaluation Note  Ryan Mayer , a 65 y.o. male  was evaluated in triage.  Pt presents with caretaker from group home who states patient has been lethargic for the last couple days.  Was seen by PCP today, labs obtained showing acute renal failure.  Patient also noted to be hypotensive, BP meds held today.  Caretaker also states patient has had a fall a couple days ago, uncertain if he hit his head, he is a difficult historian.  She does note large bruise along the right lower back, posterior hip.  Patient has been ambulatory since the fall  Review of Systems  Positive: Lethargy Negative: Fevers  Physical Exam  BP 93/65   Pulse 70   Temp 98.7 F (37.1 C)   Resp 18   SpO2 100%  Gen:   Awake, no distress difficult historian. Resp:  Normal effort, no wheezing rales or rhonchi MSK:   Moves extremities without difficulty large bruise to the right lower back/buttocks with mild tenderness.  Able to internally externally rotate both hips with no grimacing or signs of discomfort. Other:  No signs of head injury, swelling or bruising.  No cervical spinous process tenderness.  Medical Decision Making  Medically screening exam initiated at 4:04 PM.  Appropriate orders placed.  MAYO FAULK was informed that the remainder of the evaluation will be completed by another provider, this initial triage assessment does not replace that evaluation, and the importance of remaining in the ED until their evaluation is complete.    Evon Slack, PA-C 09/29/20 1608    Concha Se, MD 09/29/20 2223

## 2020-09-29 NOTE — ED Notes (Signed)
Pt sitting up in wheelchair.  Caregiver with pt.

## 2020-09-29 NOTE — ED Provider Notes (Signed)
[facility] Emergency Department Provider Note   ____________________________________________   I have reviewed the triage vital signs and the nursing notes.   HISTORY  Chief Complaint Elevated creatinine   History limited by and level 5 caveat due to:  Intellectual disability. History obtained from caregiver at bedside.    HPI Ryan Mayer is a 65 y.o. male who presents to the emergency department today accompanied by caregiver because of concern for elevated creatinine seen on outpatient blood work.  Apparently the patient went to primary care doctor's office today because of concerns for weakness.  The performed a work-up including blood work.  They found creatinine to be elevated.  Caregiver states that he does seem to be drinking a fair amount but not eating his normal amount.  Caregiver also states that they have noticed he has had some distention to his lower abdomen however he does not seem to be complaining of any pain there.  Additionally he did have a fall a few days ago and has some bruising to his right lower flank.  Apparently the patient has difficulty with medical care and has required sedation in the past.  Caregiver states that he has had ketamine for imaging in the past as well as 4 mg of Ativan to help him control his behavior.    Records reviewed. Per medical record review patient has a history of HTN, intellectual disability.  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 No pertinent surgical history.  Prior to Admission medications   Medication Sig Start Date End Date Taking? Authorizing Provider  acetaminophen (TYLENOL) 325 MG tablet Take 650 mg by mouth every 4 (four) hours as needed for fever.    [provider]  acetaZOLAMIDE ER (DIAMOX) 500 MG capsule Take 500 mg by mouth every other day.    [provider]  alum & mag hydroxide-simeth (MAALOX/MYLANTA) 200-200-20 MG/5ML suspension Take 30 mLs by mouth every 4 (four) hours as needed for indigestion or heartburn.    [provider]  amLODipine (NORVASC) 5 MG tablet Take 5 mg by mouth daily. 09/10/19   [provider]  aspirin EC 81 MG tablet Take 81 mg by mouth daily. Swallow whole.    [provider]  bisacodyl (DULCOLAX) 5 MG EC tablet Take 10 mg by mouth every 12 (twelve) hours as needed for severe constipation.    [provider]  brimonidine (ALPHAGAN) 0.2 % ophthalmic solution Place 1 drop into the left eye 3 (three) times daily.    [provider]  camphor-menthol Wynelle Fanny) lotion Apply 1 application topically as needed for itching.    [provider]  carbamazepine (TEGRETOL) 200 MG tablet Take 200 mg by mouth 3 (three) times daily. 09/10/19   [provider]  carbamide peroxide (DEBROX) 6.5 % OTIC solution Place 5-10 drops into both ears every Friday.  [provider]  carvedilol (COREG) 25 MG tablet Take 25 mg by mouth 2 (two) times daily with a meal.    [provider]  cetirizine (ZYRTEC) 10 MG tablet Take 10 mg by mouth 2 (two) times daily.    [provider]  chlorhexidine (PERIDEX) 0.12 % solution Use as directed 5 mLs in the mouth or throat 2 (two) times daily.    [provider]  docusate sodium (COLACE) 100 MG capsule Take 100 mg by mouth 3 (three) times daily  as needed for mild constipation or moderate constipation.    [provider]  dorzolamide-timolol (COSOPT) 22.3-6.8 MG/ML ophthalmic solution Place 1 drop into the left eye 2 (two) times daily.    [provider]  Emollient (CETAPHIL) cream Apply 1 application topically as directed. Apply after bath    [provider]  escitalopram (LEXAPRO) 10 MG tablet Take 10 mg by mouth daily. 09/10/19   [provider]  fesoterodine (TOVIAZ) 8 MG TB24 tablet Take 8 mg by mouth daily.    [provider]  fluticasone (FLONASE) 50 MCG/ACT nasal spray Place 1 spray into both nostrils 2 (two) times daily. 09/19/19   [provider]  guaiFENesin (ROBITUSSIN) 100 MG/5ML SOLN Take 5 mLs by mouth every 4 (four) hours as needed for cough.    [provider]  latanoprost (XALATAN) 0.005 % ophthalmic solution Place 1 drop into the left eye at bedtime.    [provider]  levocetirizine (XYZAL) 5 MG tablet Take 5 mg by mouth at bedtime.  09/10/19   [provider]  lisinopril (ZESTRIL) 20 MG tablet Take 20 mg by mouth daily. (Take with Lisinopril/HCTZ 20/12.5 tablet)    [provider]  lisinopril-hydrochlorothiazide (ZESTORETIC) 20-12.5 MG tablet Take 1 tablet by mouth daily. (Take with 20mg  Lisinopril tablet)    [provider]  loperamide (IMODIUM) 2 MG capsule Take 2-4 mg by mouth See admin instructions. Take 2 capsules (4mg ) by mouth after first loose stool then take 1 capsule (2mg ) by mouth after each additional loose stool (PRN)    [provider]  LORazepam (ATIVAN) 1 MG tablet Take 1 mg by mouth daily as needed for anxiety or sedation.    [provider]  LORazepam (ATIVAN) 1 MG tablet Take 1 mg by mouth in the morning and at bedtime. (1700 & 2000)    [provider]  metroNIDAZOLE (METROCREAM) 0.75 % cream Apply 1 application topically at bedtime. (Apply to face)    [provider]   naltrexone (DEPADE) 50 MG tablet Take 50 mg by mouth daily.    [provider]  neomycin-bacitracin-polymyxin (NEOSPORIN) ointment Apply 1 application topically daily as needed for wound care.    [provider]  pantoprazole (PROTONIX) 40 MG tablet Take 40 mg by mouth daily. 09/10/19   [provider]  polyethylene glycol (MIRALAX / GLYCOLAX) 17 g packet Take 17 g by mouth daily.    [provider]  prednisoLONE acetate (PRED FORTE) 1 % ophthalmic suspension Place 1 drop into the left eye 6 (six) times daily.    [provider]  risperiDONE (RISPERDAL) 1 MG tablet Take 0.5-1 mg by mouth See admin instructions. Take 1 tablet (1mg ) by mouth every morning, take  tablet (0.5mg ) by mouth in the afternoon and take 1 tablet (1mg ) by mouth at bedtime    [provider]  tolnaftate (TINACTIN) 1 % cream Apply 1 application topically 2 (two) times daily as  needed (itchy feet).    [provider]    Allergies Patient has no known allergies.  No family history on file.  Social History Social History   Tobacco Use   Smoking status: Never   Smokeless tobacco: Never  Substance Use Topics   Alcohol use: Never   Drug use: Never    Review of Systems Unable to obtain secondary to intellectual disability.  ____________________________________________   PHYSICAL EXAM:  VITAL SIGNS: ED Triage Vitals [09/29/20 1600]  Enc Vitals Group     BP 93/65     Pulse Rate 70     Resp 18     Temp 98.7 F (37.1 C)     Temp src      SpO2 100 %    Constitutional: Awake and alert.  Eyes: Conjunctivae are normal.  ENT      Head: Normocephalic and atraumatic.      Nose: No congestion/rhinnorhea.      Mouth/Throat: Mucous membranes are moist.      Neck: No stridor. Hematological/Lymphatic/Immunilogical: No cervical lymphadenopathy. Cardiovascular: Normal rate, regular rhythm.  No murmurs, rubs, or gallops.  Respiratory: Normal respiratory  effort without tachypnea nor retractions. Breath sounds are clear and equal bilaterally. No wheezes/rales/rhonchi. Gastrointestinal: Soft and non tender. No rebound. No guarding.  Genitourinary: Deferred Musculoskeletal: Normal range of motion in all extremities. No lower extremity edema. Neurologic:  Awake and alert. Intellectual disability.  Skin:  Skin is warm, dry and intact. No rash noted. Psychiatric: Mood and affect are normal. Speech and behavior are normal. Patient exhibits appropriate insight and judgment.  ____________________________________________    LABS (pertinent positives/negatives)  CMP na 130, k 3.1, glu 117, cr 4.61 CBC wbc 13.2, hgb 11.5, plt 153  ____________________________________________   EKG  None  ____________________________________________    RADIOLOGY  None  ____________________________________________   PROCEDURES  Procedures  ____________________________________________   INITIAL IMPRESSION / ASSESSMENT AND PLAN / ED COURSE  Pertinent labs & imaging results that were available during my care of the patient were reviewed by me and considered in my medical decision making (see chart for details).   Patient presented to the emergency department accompanied by caregiver because of concerns for elevated creatinine seen on outpatient blood work performed earlier today.  Patient also having some lower abdominal distention.  Unfortunately it is somewhat difficult to perform medical care on the patient.  Caregiver did suggest trying Ativan.  She states he is responding well to 4 mg in the past.  Do not think we will be getting IV until we can get some medication on board so did order IM Ativan.  Hopefully that will help calm the patient down enough.  I would have concerns for possible urinary retention given elevated creatinine and lower abdominal distention.  Thus would start with a bladder scan.  Also start IV fluids.  If bladder scan negative  would consider CT given concerns for distention as well as recent fall.  Patient will require admission given AKI.   ____________________________________________   FINAL CLINICAL IMPRESSION(S) / ED DIAGNOSES  Final diagnoses:  Elevated serum creatinine     Note: This dictation was prepared with Dragon dictation. Any transcriptional errors that result from this process are unintentional     Phineas Semen, MD 09/29/20 2309

## 2020-09-30 ENCOUNTER — Inpatient Hospital Stay: Payer: Medicare Other

## 2020-09-30 ENCOUNTER — Emergency Department: Payer: Medicare Other

## 2020-09-30 DIAGNOSIS — Z20822 Contact with and (suspected) exposure to covid-19: Secondary | ICD-10-CM | POA: Diagnosis present

## 2020-09-30 DIAGNOSIS — E876 Hypokalemia: Secondary | ICD-10-CM | POA: Diagnosis present

## 2020-09-30 DIAGNOSIS — G40409 Other generalized epilepsy and epileptic syndromes, not intractable, without status epilepticus: Secondary | ICD-10-CM | POA: Diagnosis present

## 2020-09-30 DIAGNOSIS — G40909 Epilepsy, unspecified, not intractable, without status epilepticus: Secondary | ICD-10-CM

## 2020-09-30 DIAGNOSIS — I959 Hypotension, unspecified: Secondary | ICD-10-CM | POA: Diagnosis present

## 2020-09-30 DIAGNOSIS — F419 Anxiety disorder, unspecified: Secondary | ICD-10-CM | POA: Diagnosis present

## 2020-09-30 DIAGNOSIS — W19XXXA Unspecified fall, initial encounter: Secondary | ICD-10-CM | POA: Diagnosis present

## 2020-09-30 DIAGNOSIS — F819 Developmental disorder of scholastic skills, unspecified: Secondary | ICD-10-CM | POA: Diagnosis present

## 2020-09-30 DIAGNOSIS — S300XXA Contusion of lower back and pelvis, initial encounter: Secondary | ICD-10-CM | POA: Diagnosis present

## 2020-09-30 DIAGNOSIS — R0602 Shortness of breath: Secondary | ICD-10-CM | POA: Diagnosis present

## 2020-09-30 DIAGNOSIS — E871 Hypo-osmolality and hyponatremia: Secondary | ICD-10-CM

## 2020-09-30 DIAGNOSIS — F79 Unspecified intellectual disabilities: Secondary | ICD-10-CM | POA: Diagnosis not present

## 2020-09-30 DIAGNOSIS — T508X5A Adverse effect of diagnostic agents, initial encounter: Secondary | ICD-10-CM | POA: Diagnosis present

## 2020-09-30 DIAGNOSIS — Z79899 Other long term (current) drug therapy: Secondary | ICD-10-CM | POA: Diagnosis not present

## 2020-09-30 DIAGNOSIS — G9341 Metabolic encephalopathy: Secondary | ICD-10-CM | POA: Diagnosis present

## 2020-09-30 DIAGNOSIS — I10 Essential (primary) hypertension: Secondary | ICD-10-CM | POA: Diagnosis present

## 2020-09-30 DIAGNOSIS — T189XXA Foreign body of alimentary tract, part unspecified, initial encounter: Secondary | ICD-10-CM | POA: Diagnosis present

## 2020-09-30 DIAGNOSIS — F72 Severe intellectual disabilities: Secondary | ICD-10-CM | POA: Diagnosis present

## 2020-09-30 DIAGNOSIS — N3090 Cystitis, unspecified without hematuria: Secondary | ICD-10-CM | POA: Diagnosis present

## 2020-09-30 DIAGNOSIS — K5900 Constipation, unspecified: Secondary | ICD-10-CM | POA: Diagnosis present

## 2020-09-30 DIAGNOSIS — E861 Hypovolemia: Secondary | ICD-10-CM | POA: Diagnosis present

## 2020-09-30 DIAGNOSIS — B961 Klebsiella pneumoniae [K. pneumoniae] as the cause of diseases classified elsewhere: Secondary | ICD-10-CM | POA: Diagnosis present

## 2020-09-30 DIAGNOSIS — N179 Acute kidney failure, unspecified: Principal | ICD-10-CM

## 2020-09-30 DIAGNOSIS — E860 Dehydration: Secondary | ICD-10-CM | POA: Diagnosis present

## 2020-09-30 DIAGNOSIS — Z7982 Long term (current) use of aspirin: Secondary | ICD-10-CM | POA: Diagnosis not present

## 2020-09-30 DIAGNOSIS — R4587 Impulsiveness: Secondary | ICD-10-CM | POA: Diagnosis present

## 2020-09-30 DIAGNOSIS — N390 Urinary tract infection, site not specified: Secondary | ICD-10-CM | POA: Diagnosis not present

## 2020-09-30 LAB — URINALYSIS, COMPLETE (UACMP) WITH MICROSCOPIC
Bacteria, UA: NONE SEEN
Glucose, UA: NEGATIVE mg/dL
Nitrite: NEGATIVE
Protein, ur: 100 mg/dL — AB
Specific Gravity, Urine: 1.015 (ref 1.005–1.030)
Squamous Epithelial / HPF: NONE SEEN (ref 0–5)
WBC, UA: >50 WBC/hpf (ref 0–5)
pH: 5.5 (ref 5.0–8.0)

## 2020-09-30 LAB — BASIC METABOLIC PANEL
Anion gap: 12 (ref 5–15)
BUN: 53 mg/dL — ABNORMAL HIGH (ref 8–23)
CO2: 17 mmol/L — ABNORMAL LOW (ref 22–32)
Calcium: 7.3 mg/dL — ABNORMAL LOW (ref 8.9–10.3)
Chloride: 101 mmol/L (ref 98–111)
Creatinine, Ser: 4.95 mg/dL — ABNORMAL HIGH (ref 0.61–1.24)
GFR, Estimated: 12 mL/min — ABNORMAL LOW (ref >60)
Glucose, Bld: 106 mg/dL — ABNORMAL HIGH (ref 70–99)
Potassium: 2.9 mmol/L — ABNORMAL LOW (ref 3.5–5.1)
Sodium: 130 mmol/L — ABNORMAL LOW (ref 135–145)

## 2020-09-30 LAB — CBC
HCT: 30.8 % — ABNORMAL LOW (ref 39.0–52.0)
Hemoglobin: 10.2 g/dL — ABNORMAL LOW (ref 13.0–17.0)
MCH: 32.9 pg (ref 26.0–34.0)
MCHC: 33.1 g/dL (ref 30.0–36.0)
MCV: 99.4 fL (ref 80.0–100.0)
Platelets: 140 10*3/uL — ABNORMAL LOW (ref 150–400)
RBC: 3.1 MIL/uL — ABNORMAL LOW (ref 4.22–5.81)
RDW: 12.7 % (ref 11.5–15.5)
WBC: 9.8 10*3/uL (ref 4.0–10.5)
nRBC: 0 % (ref 0.0–0.2)

## 2020-09-30 LAB — PROTEIN / CREATININE RATIO, URINE
Creatinine, Urine: 125 mg/dL
Protein Creatinine Ratio: 1.02 mg/mg{Cre} — ABNORMAL HIGH (ref 0.00–0.15)
Total Protein, Urine: 127 mg/dL

## 2020-09-30 LAB — PROCALCITONIN: Procalcitonin: 1.31 ng/mL

## 2020-09-30 LAB — MAGNESIUM: Magnesium: 2.1 mg/dL (ref 1.7–2.4)

## 2020-09-30 MED ORDER — POTASSIUM CHLORIDE 10 MEQ/100ML IV SOLN
10.0000 meq | INTRAVENOUS | Status: AC
Start: 2020-09-30 — End: 2020-09-30
  Administered 2020-09-30 (×2): 10 meq via INTRAVENOUS
  Filled 2020-09-30 (×2): qty 100

## 2020-09-30 MED ORDER — SODIUM CHLORIDE 0.9 % IV SOLN
INTRAVENOUS | Status: DC
Start: 2020-09-30 — End: 2020-10-02

## 2020-09-30 MED ORDER — ESCITALOPRAM OXALATE 10 MG PO TABS
10.0000 mg | ORAL_TABLET | Freq: Every day | ORAL | Status: DC
  Administered 2020-10-01 – 2020-10-02 (×2): 10 mg via ORAL
  Filled 2020-09-30 (×2): qty 1

## 2020-09-30 MED ORDER — LORAZEPAM 1 MG PO TABS
1.0000 mg | ORAL_TABLET | Freq: Every day | ORAL | Status: DC | PRN
  Administered 2020-10-01 – 2020-10-02 (×2): 1 mg via ORAL
  Filled 2020-09-30: qty 1

## 2020-09-30 MED ORDER — HYDROXYZINE HCL 50 MG PO TABS
50.0000 mg | ORAL_TABLET | Freq: Three times a day (TID) | ORAL | Status: DC | PRN
  Administered 2020-10-01 – 2020-10-02 (×2): 50 mg via ORAL
  Filled 2020-09-30 (×3): qty 1

## 2020-09-30 MED ORDER — ONDANSETRON HCL 4 MG/2ML IJ SOLN: 4.0000 mg | Freq: Four times a day (QID) | INTRAMUSCULAR | Status: DC | PRN

## 2020-09-30 MED ORDER — SODIUM CHLORIDE 0.9 % IV SOLN
1.0000 g | INTRAVENOUS | Status: DC
  Administered 2020-09-30: 23:00:00 1 g via INTRAVENOUS
  Filled 2020-09-30: qty 1
  Filled 2020-09-30: qty 10

## 2020-09-30 MED ORDER — ONDANSETRON HCL 4 MG PO TABS: 4.0000 mg | ORAL_TABLET | Freq: Four times a day (QID) | ORAL | Status: DC | PRN

## 2020-09-30 MED ORDER — POTASSIUM CHLORIDE CRYS ER 20 MEQ PO TBCR
40.0000 meq | EXTENDED_RELEASE_TABLET | Freq: Once | ORAL | Status: AC
  Administered 2020-10-01: 08:00:00 40 meq via ORAL
  Filled 2020-09-30: qty 2

## 2020-09-30 MED ORDER — TRAZODONE HCL 50 MG PO TABS
50.0000 mg | ORAL_TABLET | Freq: Two times a day (BID) | ORAL | Status: DC
  Administered 2020-10-01 – 2020-10-02 (×3): 50 mg via ORAL
  Filled 2020-09-30 (×3): qty 1

## 2020-09-30 MED ORDER — TRAZODONE HCL 50 MG PO TABS
125.0000 mg | ORAL_TABLET | Freq: Every day | ORAL | Status: DC
  Administered 2020-09-30 – 2020-10-01 (×2): 125 mg via ORAL
  Filled 2020-09-30 (×2): qty 3

## 2020-09-30 MED ORDER — ACETAMINOPHEN 325 MG PO TABS: 650.0000 mg | ORAL_TABLET | Freq: Four times a day (QID) | ORAL | Status: DC | PRN

## 2020-09-30 MED ORDER — CLONIDINE HCL 0.1 MG PO TABS: 0.1000 mg | ORAL_TABLET | Freq: Three times a day (TID) | ORAL | Status: DC

## 2020-09-30 MED ORDER — TRAZODONE HCL 50 MG PO TABS: 50.0000 mg | ORAL_TABLET | Freq: Three times a day (TID) | ORAL | Status: DC

## 2020-09-30 MED ORDER — RISPERIDONE 1 MG PO TABS
1.0000 mg | ORAL_TABLET | Freq: Two times a day (BID) | ORAL | Status: DC
  Administered 2020-09-30 – 2020-10-01 (×2): 1 mg via ORAL
  Filled 2020-09-30 (×3): qty 1

## 2020-09-30 MED ORDER — ACETAMINOPHEN 650 MG RE SUPP
650.0000 mg | Freq: Four times a day (QID) | RECTAL | Status: DC | PRN
  Filled 2020-09-30: qty 1

## 2020-09-30 MED ORDER — PANTOPRAZOLE SODIUM 40 MG PO TBEC
40.0000 mg | DELAYED_RELEASE_TABLET | Freq: Every day | ORAL | Status: DC
  Administered 2020-10-01 – 2020-10-02 (×2): 40 mg via ORAL
  Filled 2020-09-30 (×2): qty 1

## 2020-09-30 MED ORDER — LORAZEPAM 1 MG PO TABS
1.0000 mg | ORAL_TABLET | Freq: Two times a day (BID) | ORAL | Status: DC
  Administered 2020-09-30 – 2020-10-01 (×3): 1 mg via ORAL
  Filled 2020-09-30 (×4): qty 1

## 2020-09-30 MED ORDER — ENOXAPARIN SODIUM 30 MG/0.3ML IJ SOSY
30.0000 mg | PREFILLED_SYRINGE | INTRAMUSCULAR | Status: DC
  Administered 2020-09-30 – 2020-10-02 (×3): 30 mg via SUBCUTANEOUS
  Filled 2020-09-30 (×3): qty 0.3

## 2020-09-30 NOTE — ED Notes (Signed)
Pt continues to sleep, respirations non labored and equal. Will continue to monitor

## 2020-09-30 NOTE — ED Notes (Addendum)
Pt found walking around in room, has removed PIV. Assisted back to room by staff--new linens placed. Pt now in bed, independently feeding himself.

## 2020-09-30 NOTE — ED Notes (Signed)
Secure msg sent to Terri Piedra., RN for [employer]

## 2020-09-30 NOTE — ED Notes (Addendum)
[name] caregiver called for update; reports that pt has pica and will eat random items. [phone] [place].

## 2020-09-30 NOTE — ED Notes (Signed)
No change in condition will continue to monitor  

## 2020-09-30 NOTE — H&P (Signed)
History and Physical    Ryan Mayer EQA:[id number] DOB: [birth year] DOA: 09/29/2020  PCP: Lauro Regulus, MD   Patient coming from: home  I have personally briefly reviewed patient's old medical records in [facility]  Chief Complaint: lethargy, abnormal labs  HPI: Ryan Mayer is a 65 y.o. male  Group home resident, with severe intellectual disability, epilepsy with generalized tonic-clonic seizures, HTN, constipation on daily laxatives, history of pica/ ingestion of nonedible products, mostly paper products who was sent to the ED from the [place] clinic where he presented with altered mental status and hypotension.  Patient was apparently seen the day prior by EMS where he was also found to be hypoxic to the low 90s.  He does have a history of choking on ingested products.  Labs on his outpatient visit [facility] revealed a creatinine of 4.3, potassium of 2.9.  History is limited but he has been reportedly lethargic for the past 1 day and his blood pressure was low so his home antihypertensives were held.  His creatinine was normal at 0.8 just 3 weeks prior.  History is limited due to intellectual disability and was mostly obtained from ED notes and sitter at bedside  ED course: On arrival BP 93/65, pulse 70 temp 98.7 with O2 sat 100% on room air WBC 13,000.  Lactic acid not done Sodium 130, creatinine 4.61 with normal potassium and bicarb Urinalysis pending  Imaging: CT abdomen and pelvis no acute intra-abdominal or pelvic pathology Ingested wires within the stomach and colon.  No bowel obstruction.  Constipation CT head: No acute intracranial pathology.  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  Hospitalist consulted for admission pending sedation for initiation of IV fluid bolus and hydration  Review of Systems: Unable to obtain due to intellectual disability   Past Medical  History:  Diagnosis Date   Hypertension    Intellectual disability    Seizure (HCC)     History reviewed. No pertinent surgical history.   reports that he has never smoked. He has never used smokeless tobacco. He reports that he does not drink alcohol and does not use drugs.  No Known Allergies  Family history: Unable to review due to intellectual disability   Prior to Admission medications   Medication Sig Start Date End Date Taking? Authorizing Provider  acetaminophen (TYLENOL) 325 MG tablet Take 650 mg by mouth every 4 (four) hours as needed for fever.    [provider]  acetaZOLAMIDE ER (DIAMOX) 500 MG capsule Take 500 mg by mouth every other day.    [provider]  alum & mag hydroxide-simeth (MAALOX/MYLANTA) 200-200-20 MG/5ML suspension Take 30 mLs by mouth every 4 (four) hours as needed for indigestion or heartburn.    [provider]  amLODipine (NORVASC) 5 MG tablet Take 5 mg by mouth daily. 09/10/19   [provider]  aspirin EC 81 MG tablet Take 81 mg by mouth daily. Swallow whole.    [provider]  bisacodyl (DULCOLAX) 5 MG EC tablet Take 10 mg by mouth every 12 (twelve) hours as needed for severe constipation.    [provider]  brimonidine (ALPHAGAN) 0.2 % ophthalmic solution Place 1 drop into the left eye 3 (three) times daily.    [provider]  camphor-menthol Wynelle Fanny) lotion Apply 1 application topically as needed for itching.    [provider]  carbamazepine (TEGRETOL) 200  MG tablet Take 200 mg by mouth 3 (three) times daily. 09/10/19   [provider]  carbamide peroxide (DEBROX) 6.5 % OTIC solution Place 5-10 drops into both ears every Friday.    [provider]  carvedilol (COREG) 25 MG tablet Take 25 mg by mouth 2 (two) times daily with a meal.    [provider]  cetirizine (ZYRTEC) 10 MG tablet Take 10 mg by mouth 2 (two) times daily.    [provider]   chlorhexidine (PERIDEX) 0.12 % solution Use as directed 5 mLs in the mouth or throat 2 (two) times daily.    [provider]  docusate sodium (COLACE) 100 MG capsule Take 100 mg by mouth 3 (three) times daily as needed for mild constipation or moderate constipation.    [provider]  dorzolamide-timolol (COSOPT) 22.3-6.8 MG/ML ophthalmic solution Place 1 drop into the left eye 2 (two) times daily.    [provider]  Emollient (CETAPHIL) cream Apply 1 application topically as directed. Apply after bath    [provider]  escitalopram (LEXAPRO) 10 MG tablet Take 10 mg by mouth daily. 09/10/19   [provider]  fesoterodine (TOVIAZ) 8 MG TB24 tablet Take 8 mg by mouth daily.    [provider]  fluticasone (FLONASE) 50 MCG/ACT nasal spray Place 1 spray into both nostrils 2 (two) times daily. 09/19/19   [provider]  guaiFENesin (ROBITUSSIN) 100 MG/5ML SOLN Take 5 mLs by mouth every 4 (four) hours as needed for cough.    [provider]  latanoprost (XALATAN) 0.005 % ophthalmic solution Place 1 drop into the left eye at bedtime.    [provider]  levocetirizine (XYZAL) 5 MG tablet Take 5 mg by mouth at bedtime.  09/10/19   [provider]  lisinopril (ZESTRIL) 20 MG tablet Take 20 mg by mouth daily. (Take with Lisinopril/HCTZ 20/12.5 tablet)    [provider]  lisinopril-hydrochlorothiazide (ZESTORETIC) 20-12.5 MG tablet Take 1 tablet by mouth daily. (Take with 20mg  Lisinopril tablet)    [provider]  loperamide (IMODIUM) 2 MG capsule Take 2-4 mg by mouth See admin instructions. Take 2 capsules (4mg ) by mouth after first loose stool then take 1 capsule (2mg ) by mouth after each additional loose stool (PRN)    [provider]  LORazepam (ATIVAN) 1 MG tablet Take 1 mg by mouth daily as needed for anxiety or sedation.    [provider]  LORazepam (ATIVAN) 1 MG tablet Take  1 mg by mouth in the morning and at bedtime. (1700 & 2000)    [provider]  metroNIDAZOLE (METROCREAM) 0.75 % cream Apply 1 application topically at bedtime. (Apply to face)    [provider]  naltrexone (DEPADE) 50 MG tablet Take 50 mg by mouth daily.    [provider]  neomycin-bacitracin-polymyxin (NEOSPORIN) ointment Apply 1 application topically daily as needed for wound care.    [provider]  pantoprazole (PROTONIX) 40 MG tablet Take 40 mg by mouth daily. 09/10/19   [provider]  polyethylene glycol (MIRALAX / GLYCOLAX) 17 g packet Take 17 g by mouth daily.    [provider]  prednisoLONE acetate (PRED FORTE) 1 % ophthalmic suspension Place 1 drop into the left eye 6 (six) times daily.    [provider]  risperiDONE (RISPERDAL) 1 MG tablet Take 0.5-1 mg by mouth See admin instructions. Take 1 tablet (1mg ) by mouth every morning, take  tablet (  0.5mg ) by mouth in the afternoon and take 1 tablet (1mg ) by mouth at bedtime    [provider]  tolnaftate (TINACTIN) 1 % cream Apply 1 application topically 2 (two) times daily as needed (itchy feet).    [provider]    Physical Exam: Vitals:   09/29/20 1600 09/30/20 0110 09/30/20 0111  BP: 93/65    Pulse: 70 (!) 59 (!) 59  Resp: 18    Temp: 98.7 F (37.1 C)    SpO2: 100% 97% 97%     Vitals:   09/29/20 1600 09/30/20 0110 09/30/20 0111  BP: 93/65    Pulse: 70 (!) 59 (!) 59  Resp: 18    Temp: 98.7 F (37.1 C)    SpO2: 100% 97% 97%      Constitutional: somnolent (from meds given in the ED) . Not in any apparent distress HEENT:      Head: Normocephalic and atraumatic.         Eyes: PERLA, EOMI, Conjunctivae are normal. Sclera is non-icteric.       Mouth/Throat: Mucous membranes are moist.       Neck: Supple with no signs of meningismus. Cardiovascular: Regular rate and rhythm. No murmurs, gallops, or rubs. 2+ symmetrical distal pulses are  present . No JVD. No LE edema Respiratory: Respiratory effort normal .Lungs sounds clear bilaterally. No wheezes, crackles, or rhonchi.  Gastrointestinal: Soft, non tender, and non distended with positive bowel sounds.  Genitourinary: No CVA tenderness. Musculoskeletal: Nontender with normal range of motion in all extremities. No cyanosis, or erythema of extremities. Neurologic:  Face is symmetric. Moving all extremities. No gross focal neurologic deficits . Skin: Skin is warm, dry.  No rash or ulcers Psychiatric: somnolent   Labs on Admission: I have personally reviewed following labs and imaging studies  CBC: Recent Labs  Lab 09/29/20 1600  WBC 13.2*  HGB 11.5*  HCT 31.7*  MCV 93.2  PLT 153   Basic Metabolic Panel: Recent Labs  Lab 09/29/20 1600  NA 130*  K 3.1*  CL 95*  CO2 23  GLUCOSE 117*  BUN 50*  CREATININE 4.61*  CALCIUM 8.0*  MG 2.1   GFR: CrCl cannot be calculated (Unknown ideal weight.). Liver Function Tests: Recent Labs  Lab 09/29/20 1600  AST 30  ALT 20  ALKPHOS 62  BILITOT 0.6  PROT 6.5  ALBUMIN 3.3*   No results for input(s): LIPASE, AMYLASE in the last 168 hours. No results for input(s): AMMONIA in the last 168 hours. Coagulation Profile: No results for input(s): INR, PROTIME in the last 168 hours. Cardiac Enzymes: No results for input(s): CKTOTAL, CKMB, CKMBINDEX, TROPONINI in the last 168 hours. BNP (last 3 results) No results for input(s): PROBNP in the last 8760 hours. HbA1C: No results for input(s): HGBA1C in the last 72 hours. CBG: No results for input(s): GLUCAP in the last 168 hours. Lipid Profile: No results for input(s): CHOL, HDL, LDLCALC, TRIG, CHOLHDL, LDLDIRECT in the last 72 hours. Thyroid Function Tests: No results for input(s): TSH, T4TOTAL, FREET4, T3FREE, THYROIDAB in the last 72 hours. Anemia Panel: No results for input(s): VITAMINB12, FOLATE, FERRITIN, TIBC, IRON, RETICCTPCT in the last 72 hours. Urine analysis:     Component Value Date/Time   COLORURINE YELLOW (A) 09/07/2020 1550   APPEARANCEUR CLEAR (A) 09/07/2020 1550   LABSPEC 1.016 09/07/2020 1550   PHURINE 6.0 09/07/2020 1550   GLUCOSEU NEGATIVE 09/07/2020 1550   HGBUR NEGATIVE 09/07/2020 1550   BILIRUBINUR NEGATIVE 09/07/2020 1550  KETONESUR NEGATIVE 09/07/2020 1550   PROTEINUR NEGATIVE 09/07/2020 1550   NITRITE NEGATIVE 09/07/2020 1550   LEUKOCYTESUR NEGATIVE 09/07/2020 1550    Radiological Exams on Admission: CT ABDOMEN PELVIS WO CONTRAST  Result Date: 09/30/2020 CLINICAL DATA:  Abdominal distension. EXAM: CT ABDOMEN AND PELVIS WITHOUT CONTRAST TECHNIQUE: Multidetector CT imaging of the abdomen and pelvis was performed following the standard protocol without IV contrast. COMPARISON:  CT abdomen pelvis dated 09/30/2019. FINDINGS: Evaluation of this exam is limited in the absence of intravenous contrast. Lower chest: There is mild eventration of the right hemidiaphragm with mild right lung base atelectasis. There is coronary vascular calcification. No intra-abdominal free air or free fluid. Hepatobiliary: The liver is unremarkable. No intrahepatic biliary ductal dilatation. Gallstones. No pericholecystic fluid. Pancreas: Unremarkable. No pancreatic ductal dilatation or surrounding inflammatory changes. Spleen: Normal in size without focal abnormality. Adrenals/Urinary Tract: The adrenal glands unremarkable. The kidneys, visualized ureters, and urinary bladder appear unremarkable. Stomach/Bowel: There is large amount of stool throughout the colon. Multiple ingested wires noted within the stomach and throughout the colon. There is no bowel obstruction or active inflammation. The appendix is normal. Vascular/Lymphatic: Mild aortoiliac atherosclerotic disease. The IVC is unremarkable. No portal venous gas. There is no adenopathy. Reproductive: Prostate and seminal vesicles are grossly unremarkable. Other: None Musculoskeletal: Degenerative changes of the  spine. No acute osseous pathology. IMPRESSION: 1. No acute intra-abdominal or pelvic pathology. 2. Constipation. No bowel obstruction. Normal appendix. 3. Ingested wires within the stomach and colon. 4. Cholelithiasis. 5. Aortic Atherosclerosis (ICD10-I70.0). Electronically Signed   By: Elgie Collard M.D.   On: 09/30/2020 00:44   CT HEAD WO CONTRAST ( )  Result Date: 09/30/2020 CLINICAL DATA:  Trauma.  Altered mental status. EXAM: CT HEAD WITHOUT CONTRAST TECHNIQUE: Contiguous axial images were obtained from the base of the skull through the vertex without intravenous contrast. COMPARISON:  Chest CT dated 07/15/2020. FINDINGS: Brain: Mild age-related atrophy chronic microvascular ischemic changes. Areas of old infarct involving the right parietal convexity. There is no acute intracranial hemorrhage. Mass effect or midline shift. No extra-axial fluid collection. Vascular: No hyperdense vessel or unexpected calcification. Skull: Normal. Negative for fracture or focal lesion. Sinuses/Orbits: No acute finding. Other: None IMPRESSION: 1. No acute intracranial pathology. 2. Mild age-related atrophy chronic microvascular ischemic changes. 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 Electronically Signed   By: Elgie Collard M.D.   On: 09/30/2020 00:37     Assessment/Plan 65 year old male group home resident, with severe intellectual disability, epilepsy with generalized tonic-clonic seizures, HTN, constipation on daily laxatives, history of pica/ ingestion of nonedible products who was sent to the ED from the [place] clinic where he presented with altered mental status and hypotension.  Labs drawn at [facility] revealed a creatinine of 4.3, potassium of 2.9.     AKI (acute kidney injury) (HCC) - Creatinine 4.6, up from baseline of 0.94 three weeks prior -Etiology uncertain, suspecting related to dehydration from laxative use as well as medication related. - CT abdomen with no evidence of obstructive  uropathy - IV hydration - Monitor renal function and avoid nephrotoxins - Renal consult   Hypotension, with history of hypertension - Suspect medication related in the setting of dehydration - Possible sepsis.  Patient is afebrile and not tachycardic but hypotensive.  Patient has leukocytosis but lactic acid not available.  Has had UTIs and colitis in the past - Follow UA chest x-ray and procalcitonin - IV hydration  - Hold antihypertensives - Initiate antibiotics if no evidence of infectious etiology  Altered mental status/acute metabolic encephalopathy - Patient was brought in due to lethargy - Possible acute infection versus medication related versus lethargy secondary to electrolyte derangement - IV hydration - Neurologic checks - Aspiration and fall precautions    Hyponatremia - Likely related to dehydration IV hydration with normal saline and monitor    Foreign body ingestion - CT abdomen showing wires in the stomach and colon without bowel obstruction as well as constipation - Continue to monitor    Intellectual disability - One-to-one observation - Patient with poor cooperation with diagnostic testing and subsequent delays in treatment    Epilepsy (HCC) - Awaiting med rec for resumption of antiepileptics    DVT prophylaxis: Lovenox  Code Status: full code  Family Communication:  none  Disposition Plan: Back to previous home environment Consults called: renal  Status:At the time of admission, it appears that the appropriate admission status for this patient is INPATIENT. This is judged to be reasonable and necessary in order to provide the required intensity of service to ensure the patient's safety given the presenting symptoms, physical exam findings, and initial radiographic and laboratory data in the context of their  Comorbid conditions.   Patient requires inpatient status due to high intensity of service, high risk for further deterioration and high frequency of  surveillance required.   I certify that at the point of admission it is my clinical judgment that the patient will require inpatient hospital care spanning beyond 2 midnights     Andris Baumann MD [facility]     09/30/2020, 1:38 AM

## 2020-09-30 NOTE — ED Notes (Signed)
Iv started and fluids infusing.  Siderails up  sitter with pt

## 2020-09-30 NOTE — ED Notes (Signed)
Report received from Amy RN. Patient care assumed. Patient/RN introduction complete. Will continue to monitor.  

## 2020-09-30 NOTE — ED Notes (Signed)
Pt resting with eyes closed. Snoring can be heard. Pt calm and cooperative at this time. Will continue to monitor.

## 2020-09-30 NOTE — Consult Note (Signed)
[facility]  CONSULT NOTE    Date: 09/30/2020                  Patient Name:  Ryan Mayer  MRN: [id number]  DOB: April 09, 1955  Age / Sex: 65 y.o., male         PCP: Lauro Regulus, MD                 Service Requesting Consult: [facility]                 Reason for Consult: Acute kidney injury            History of Present Illness: Ryan Mayer is a 65 y.o.  male with  epilepsy, severe intellectual disability, and hypertension, who was admitted to [facility] on 09/29/2020 for AKI (acute kidney injury) [facility]) [N17.9]   Patient presents to ED from the [place] [facility] for altered mental status and hypotension. Labs at outpatient clinic show a creatinine 4.3 and potassium 2.9. He is seen resting in bed, no family or caregivers at bedside. Caregivers report lethargy for the past day along with an unsteady gait. He has a history of ingesting nonedible items. CT abdomen show wires in the stomach and colon. CT head show no new events. Normal renal function three weeks ago. We have been consulted to evaluate acute kidney injury.    Medications: Outpatient medications: (Not in a hospital admission)   Current medications: Current Facility-Administered Medications  Medication Dose Route Frequency Provider Last Rate Last Admin   0.9 %  sodium chloride infusion   Intravenous Continuous Ward, Kristen N, DO 125 mL/hr at 09/30/20 0509 Rate Verify at 09/30/20 0509   acetaminophen (TYLENOL) tablet 650 mg  650 mg Oral Q6H PRN Andris Baumann, MD       Or   acetaminophen (TYLENOL) suppository 650 mg  650 mg Rectal Q6H PRN Andris Baumann, MD       enoxaparin (LOVENOX) injection 30 mg  30 mg Subcutaneous Q24H Lindajo Royal V, MD   30 mg at 09/30/20 0759   ondansetron (ZOFRAN) tablet 4 mg  4 mg Oral Q6H PRN Andris Baumann, MD       Or   ondansetron [facility]) injection 4 mg  4 mg Intravenous Q6H PRN Andris Baumann, MD       potassium chloride 10 mEq in 100 mL IVPB  10 mEq  Intravenous Q1 Hr x 2 , Gery Pray, NP       Current Outpatient Medications  Medication Sig Dispense Refill   acetaminophen (TYLENOL) 325 MG tablet Take 650 mg by mouth every 4 (four) hours as needed for fever.     acetaZOLAMIDE ER (DIAMOX) 500 MG capsule Take 500 mg by mouth every other day.     alum & mag hydroxide-simeth (MAALOX/MYLANTA) 200-200-20 MG/5ML suspension Take 30 mLs by mouth every 4 (four) hours as needed for indigestion or heartburn.     amLODipine (NORVASC) 5 MG tablet Take 5 mg by mouth daily.     aspirin EC 81 MG tablet Take 81 mg by mouth daily. Swallow whole.     bisacodyl (DULCOLAX) 5 MG EC tablet Take 10 mg by mouth every 12 (twelve) hours as needed for severe constipation.     brimonidine (ALPHAGAN) 0.2 % ophthalmic solution Place 1 drop into the left eye 3 (three) times daily.     camphor-menthol (SARNA) lotion Apply 1 application topically as needed for itching.  carbamazepine (TEGRETOL) 200 MG tablet Take 200 mg by mouth 3 (three) times daily.     carbamide peroxide (DEBROX) 6.5 % OTIC solution Place 5-10 drops into both ears every Friday.     carvedilol (COREG) 25 MG tablet Take 25 mg by mouth 2 (two) times daily with a meal.     cetirizine (ZYRTEC) 10 MG tablet Take 10 mg by mouth 2 (two) times daily.     chlorhexidine (PERIDEX) 0.12 % solution Use as directed 5 mLs in the mouth or throat 2 (two) times daily.     docusate sodium (COLACE) 100 MG capsule Take 100 mg by mouth 3 (three) times daily as needed for mild constipation or moderate constipation.     dorzolamide-timolol (COSOPT) 22.3-6.8 MG/ML ophthalmic solution Place 1 drop into the left eye 2 (two) times daily.     Emollient (CETAPHIL) cream Apply 1 application topically as directed. Apply after bath     escitalopram (LEXAPRO) 10 MG tablet Take 10 mg by mouth daily.     fesoterodine (TOVIAZ) 8 MG TB24 tablet Take 8 mg by mouth daily.     fluticasone (FLONASE) 50 MCG/ACT nasal spray Place 1 spray into  both nostrils 2 (two) times daily.     guaiFENesin (ROBITUSSIN) 100 MG/5ML SOLN Take 5 mLs by mouth every 4 (four) hours as needed for cough.     latanoprost (XALATAN) 0.005 % ophthalmic solution Place 1 drop into the left eye at bedtime.     levocetirizine (XYZAL) 5 MG tablet Take 5 mg by mouth at bedtime.      lisinopril (ZESTRIL) 20 MG tablet Take 20 mg by mouth daily. (Take with Lisinopril/HCTZ 20/12.5 tablet)     lisinopril-hydrochlorothiazide (ZESTORETIC) 20-12.5 MG tablet Take 1 tablet by mouth daily. (Take with 20mg  Lisinopril tablet)     loperamide (IMODIUM) 2 MG capsule Take 2-4 mg by mouth See admin instructions. Take 2 capsules (4mg ) by mouth after first loose stool then take 1 capsule (2mg ) by mouth after each additional loose stool (PRN)     LORazepam (ATIVAN) 1 MG tablet Take 1 mg by mouth daily as needed for anxiety or sedation.     LORazepam (ATIVAN) 1 MG tablet Take 1 mg by mouth in the morning and at bedtime. (1700 & 2000)     metroNIDAZOLE (METROCREAM) 0.75 % cream Apply 1 application topically at bedtime. (Apply to face)     naltrexone (DEPADE) 50 MG tablet Take 50 mg by mouth daily.     neomycin-bacitracin-polymyxin (NEOSPORIN) ointment Apply 1 application topically daily as needed for wound care.     pantoprazole (PROTONIX) 40 MG tablet Take 40 mg by mouth daily.     polyethylene glycol (MIRALAX / GLYCOLAX) 17 g packet Take 17 g by mouth daily.     prednisoLONE acetate (PRED FORTE) 1 % ophthalmic suspension Place 1 drop into the left eye 6 (six) times daily.     risperiDONE (RISPERDAL) 1 MG tablet Take 0.5-1 mg by mouth See admin instructions. Take 1 tablet (1mg ) by mouth every morning, take  tablet (0.5mg ) by mouth in the afternoon and take 1 tablet (1mg ) by mouth at bedtime     tolnaftate (TINACTIN) 1 % cream Apply 1 application topically 2 (two) times daily as needed (itchy feet).        Allergies: No Known Allergies    Past Medical History: Past Medical History:   Diagnosis Date   Hypertension    Intellectual disability    Seizure (HCC)  Past Surgical History: History reviewed. No pertinent surgical history.   Family History: No family history on file.   Social History: Social History   Socioeconomic History   Marital status: Single    Spouse name: Not on file   Number of children: Not on file   Years of education: Not on file   Highest education level: Not on file  Occupational History   Not on file  Tobacco Use   Smoking status: Never   Smokeless tobacco: Never  Substance and Sexual Activity   Alcohol use: Never   Drug use: Never   Sexual activity: Not on file  Other Topics Concern   Not on file  Social History Narrative   Not on file   Social Determinants of Health   Financial Resource Strain: Not on file  Food Insecurity: Not on file  Transportation Needs: Not on file  Physical Activity: Not on file  Stress: Not on file  Social Connections: Not on file  Intimate Partner Violence: Not on file     Review of Systems: Review of Systems  Unable to perform ROS: Patient unresponsive   Vital Signs: Blood pressure 110/89, pulse 71, temperature 98.7 F (37.1 C), resp. rate 16, SpO2 97 %.  Weight trends: There were no vitals filed for this visit.  Physical Exam: General: NAD, laying on stretcher  Head: Normocephalic, atraumatic. Moist oral mucosal membranes  Eyes: Anicteric  Neck: Supple  Lungs:  Clear to auscultation, normal effort  Heart: Regular rate and rhythm  Abdomen:  Soft, nontender,   Extremities:  no peripheral edema.  Neurologic: lethargic, unable to follow commands, moving all four extremities  Skin: No lesions        Lab results: Basic Metabolic Panel: Recent Labs  Lab 09/29/20 1600 09/30/20 0203  NA 130* 130*  K 3.1* 2.9*  CL 95* 101  CO2 23 17*  GLUCOSE 117* 106*  BUN 50* 53*  CREATININE 4.61* 4.95*  CALCIUM 8.0* 7.3*  MG 2.1  --     Liver Function Tests: Recent Labs   Lab 09/29/20 1600  AST 30  ALT 20  ALKPHOS 62  BILITOT 0.6  PROT 6.5  ALBUMIN 3.3*   No results for input(s): LIPASE, AMYLASE in the last 168 hours. No results for input(s): AMMONIA in the last 168 hours.  CBC: Recent Labs  Lab 09/29/20 1600 09/30/20 0203  WBC 13.2* 9.8  HGB 11.5* 10.2*  HCT 31.7* 30.8*  MCV 93.2 99.4  PLT 153 140*    Cardiac Enzymes: No results for input(s): CKTOTAL, CKMB, CKMBINDEX, TROPONINI in the last 168 hours.  BNP: Invalid input(s): POCBNP  CBG: No results for input(s): GLUCAP in the last 168 hours.  Microbiology: Results for orders placed or performed during the hospital encounter of 09/29/20  Resp Panel by RT-PCR (Flu A&B, Covid) Nasopharyngeal Swab     Status: None   Collection Time: 09/29/20  9:44 PM   Specimen: Nasopharyngeal Swab; Nasopharyngeal(NP) swabs in vial transport medium  Result Value Ref Range Status   SARS Coronavirus 2 by RT PCR NEGATIVE NEGATIVE Final    Comment: (NOTE) SARS-CoV-2 target nucleic acids are NOT DETECTED.  The SARS-CoV-2 RNA is generally detectable in upper respiratory specimens during the acute phase of infection. The lowest concentration of SARS-CoV-2 viral copies this assay can detect is 138 copies/mL. A negative result does not preclude SARS-Cov-2 infection and should not be used as the sole basis for treatment or other patient management decisions. A negative result  may occur with  improper specimen collection/handling, submission of specimen other than nasopharyngeal swab, presence of viral mutation(s) within the areas targeted by this assay, and inadequate number of viral copies(<138 copies/mL). A negative result must be combined with clinical observations, patient history, and epidemiological information. The expected result is Negative.  Fact Sheet for Patients:  BloggerCourse.com  Fact Sheet for Healthcare Providers:   SeriousBroker.it  This test is no t yet approved or cleared by the [place] FDA and  has been authorized for detection and/or diagnosis of SARS-CoV-2 by FDA under an Emergency Use Authorization (EUA). This EUA will remain  in effect (meaning this test can be used) for the duration of the COVID-19 declaration under Section 564(b)(1) of the Act, 21 U.S.C.section 360bbb-3(b)(1), unless the authorization is terminated  or revoked sooner.       Influenza A by PCR NEGATIVE NEGATIVE Final   Influenza B by PCR NEGATIVE NEGATIVE Final    Comment: (NOTE) The Xpert Xpress SARS-CoV-2/FLU/RSV plus assay is intended as an aid in the diagnosis of influenza from Nasopharyngeal swab specimens and should not be used as a sole basis for treatment. Nasal washings and aspirates are unacceptable for Xpert Xpress SARS-CoV-2/FLU/RSV testing.  Fact Sheet for Patients: BloggerCourse.com  Fact Sheet for Healthcare Providers: SeriousBroker.it  This test is not yet approved or cleared by the [place] FDA and has been authorized for detection and/or diagnosis of SARS-CoV-2 by FDA under an Emergency Use Authorization (EUA). This EUA will remain in effect (meaning this test can be used) for the duration of the COVID-19 declaration under Section 564(b)(1) of the Act, 21 U.S.C. section 360bbb-3(b)(1), unless the authorization is terminated or revoked.  Performed at [facility], [address] [place], [place] [address]     Coagulation Studies: No results for input(s): LABPROT, INR in the last 72 hours.  Urinalysis: Recent Labs    09/30/20 0325  COLORURINE YELLOW  LABSPEC 1.015  PHURINE 5.5  GLUCOSEU NEGATIVE  HGBUR LARGE*  BILIRUBINUR SMALL*  KETONESUR TRACE*  PROTEINUR 100*  NITRITE NEGATIVE  LEUKOCYTESUR SMALL*      Imaging: CT ABDOMEN PELVIS WO CONTRAST  Result Date: 09/30/2020 CLINICAL  DATA:  Abdominal distension. EXAM: CT ABDOMEN AND PELVIS WITHOUT CONTRAST TECHNIQUE: Multidetector CT imaging of the abdomen and pelvis was performed following the standard protocol without IV contrast. COMPARISON:  CT abdomen pelvis dated 09/30/2019. FINDINGS: Evaluation of this exam is limited in the absence of intravenous contrast. Lower chest: There is mild eventration of the right hemidiaphragm with mild right lung base atelectasis. There is coronary vascular calcification. No intra-abdominal free air or free fluid. Hepatobiliary: The liver is unremarkable. No intrahepatic biliary ductal dilatation. Gallstones. No pericholecystic fluid. Pancreas: Unremarkable. No pancreatic ductal dilatation or surrounding inflammatory changes. Spleen: Normal in size without focal abnormality. Adrenals/Urinary Tract: The adrenal glands unremarkable. The kidneys, visualized ureters, and urinary bladder appear unremarkable. Stomach/Bowel: There is large amount of stool throughout the colon. Multiple ingested wires noted within the stomach and throughout the colon. There is no bowel obstruction or active inflammation. The appendix is normal. Vascular/Lymphatic: Mild aortoiliac atherosclerotic disease. The IVC is unremarkable. No portal venous gas. There is no adenopathy. Reproductive: Prostate and seminal vesicles are grossly unremarkable. Other: None Musculoskeletal: Degenerative changes of the spine. No acute osseous pathology. IMPRESSION: 1. No acute intra-abdominal or pelvic pathology. 2. Constipation. No bowel obstruction. Normal appendix. 3. Ingested wires within the stomach and colon. 4. Cholelithiasis. 5. Aortic Atherosclerosis (ICD10-I70.0). Electronically Signed  By: Elgie Collard M.D.   On: 09/30/2020 00:44   CT HEAD WO CONTRAST ( )  Result Date: 09/30/2020 CLINICAL DATA:  Trauma.  Altered mental status. EXAM: CT HEAD WITHOUT CONTRAST TECHNIQUE: Contiguous axial images were obtained from the base of the skull  through the vertex without intravenous contrast. COMPARISON:  Chest CT dated 07/15/2020. FINDINGS: Brain: Mild age-related atrophy chronic microvascular ischemic changes. Areas of old infarct involving the right parietal convexity. There is no acute intracranial hemorrhage. Mass effect or midline shift. No extra-axial fluid collection. Vascular: No hyperdense vessel or unexpected calcification. Skull: Normal. Negative for fracture or focal lesion. Sinuses/Orbits: No acute finding. Other: None IMPRESSION: 1. No acute intracranial pathology. 2. Mild age-related atrophy chronic microvascular ischemic changes. 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 Electronically Signed   By: Elgie Collard M.D.   On: 09/30/2020 00:37   DG Chest Port 1 View  Result Date: 09/30/2020 CLINICAL DATA:  Shortness of breath. EXAM: PORTABLE CHEST 1 VIEW COMPARISON:  Chest radiograph dated 07/15/2020. FINDINGS: Shallow inspiration with bibasilar atelectasis. No focal consolidation, pleural effusion, or pneumothorax. The cardiac silhouette is within limits. Atherosclerotic calcification of the aorta. Ingested wires in the stomach. No acute osseous pathology. IMPRESSION: No active disease. Electronically Signed   By: Elgie Collard M.D.   On: 09/30/2020 02:12     Assessment & Plan: Ryan Mayer is a 65 y.o.  male with epilepsy, severe intellectual disability, and hypertension, who was admitted to [facility] on 09/29/2020 for AKI (acute kidney injury) (HCC) [N17.9]  1 Acute kidney injury of unknown etiology. IV Contrast exposure on 09/07/20, but meaningful recovery would have occurred. Reports of laxative use, possible dehydration. Outpatient Creatinine 4.3 on admission. Baseline creatinine of 0.94 on 09/07/20. CT abdomen/pelvis negative for renal obstruction. No recorded urine output. IVF started by primary team. Will order strict intake and output. Avoid nephrotoxic agents. Will continue to monitor renal function.   2. Hypertension Home  regimen includes amlodipine, clonidine, carvedilol, and lisinopril. Was hypotensive on admission. BP stable 126/90    LOS: 0   9/21/20229:48 AM

## 2020-09-30 NOTE — ED Notes (Signed)
Pt continues rest with eyes closed. NAD. Respirations equal and non labored.Will continue to monitor for changes.

## 2020-09-30 NOTE — ED Notes (Signed)
Pt sleeping. 

## 2020-09-30 NOTE — ED Notes (Signed)
Pt resting comfortably in bed, NAD, chest rise & fall. Recruitment consultant at [facility]. No needs identified at this time. Bed low & locked, call light & personal items within reach.

## 2020-09-30 NOTE — ED Notes (Signed)
Pt awakes easily to having VS checked. Resting quietly with eyes closed. Respirations non labored and equal. Will continue to monitor for changes.

## 2020-09-30 NOTE — ED Notes (Signed)
Pt sleeping   siderails up x 2. 

## 2020-09-30 NOTE — Progress Notes (Signed)
Same-day rounding progress note  Patient seen in the ED.  He would not cooperate with exam.  Remains lethargic.  Please see Dr. Lianne Bushy dictated history and physical for further details.  AKI -I discussed with patient's legal guardian who could not provide much information other than patient's fall yesterday at group home. -This is likely prerenal from dehydration -Continue IV hydration and monitor renal function.  Nephro consult  Time spent: 15 minutes

## 2020-09-30 NOTE — ED Notes (Signed)
Pt continues to sleep with eyes closed. VSS. Resp nonlabored and equal. NAD. Will continue to monitor for changes.

## 2020-09-30 NOTE — ED Notes (Signed)
Pt awake, unable to place iv at this time.  Sitter with pt

## 2020-09-30 NOTE — ED Notes (Signed)
Pt in bed now.  Siderails up x 2  caregiver with pt.  Bladder scan 118 mls  dr York Cerise and Ward aware.

## 2020-09-30 NOTE — ED Provider Notes (Signed)
-----------------------------------------   12:02 AM on 09/30/2020 -----------------------------------------  Assuming care from Dr. Derrill Kay.  In short, Ryan Mayer is a 65 y.o. male with a chief complaint of lab abnormalities.  Refer to the original H&P for additional details.  The current plan of care is to follow up on CT scans.  Bladder scan showed about 150 mL.   ----------------------------------------- 12:50 AM on 09/30/2020 -----------------------------------------  CT scan of the head is unremarkable.  CT scan of the abdomen and pelvis shows constipation and some "ingested wires" but no evidence of perforation or inflammation nor of any obvious obstructive pathology.  Patient is still agitated and unwilling to participate with IV placement.  We will sedate him further if necessary to allow for placement of IV and IV hydration.  His caregiver understands he needs to be admitted.  Consulting hospitalist for admission.    (Note that documentation was delayed due to multiple ED patients requiring immediate care.)  Discussed case by phone with the hospitalist, Dr. Para March.  She will admit the patient.   Loleta Rose, MD 09/30/20 [phone]

## 2020-10-01 DIAGNOSIS — F79 Unspecified intellectual disabilities: Secondary | ICD-10-CM

## 2020-10-01 DIAGNOSIS — B9689 Other specified bacterial agents as the cause of diseases classified elsewhere: Secondary | ICD-10-CM

## 2020-10-01 DIAGNOSIS — N390 Urinary tract infection, site not specified: Secondary | ICD-10-CM

## 2020-10-01 DIAGNOSIS — E876 Hypokalemia: Secondary | ICD-10-CM

## 2020-10-01 LAB — BASIC METABOLIC PANEL
Anion gap: 10 (ref 5–15)
BUN: 41 mg/dL — ABNORMAL HIGH (ref 8–23)
CO2: 19 mmol/L — ABNORMAL LOW (ref 22–32)
Calcium: 8.4 mg/dL — ABNORMAL LOW (ref 8.9–10.3)
Chloride: 106 mmol/L (ref 98–111)
Creatinine, Ser: 1.9 mg/dL — ABNORMAL HIGH (ref 0.61–1.24)
GFR, Estimated: 39 mL/min — ABNORMAL LOW (ref >60)
Glucose, Bld: 124 mg/dL — ABNORMAL HIGH (ref 70–99)
Potassium: 3.1 mmol/L — ABNORMAL LOW (ref 3.5–5.1)
Sodium: 135 mmol/L (ref 135–145)

## 2020-10-01 LAB — CREATININE, SERUM
Creatinine, Ser: 2.25 mg/dL — ABNORMAL HIGH (ref 0.61–1.24)
GFR, Estimated: 32 mL/min — ABNORMAL LOW (ref >60)

## 2020-10-01 LAB — PROCALCITONIN: Procalcitonin: 0.3 ng/mL

## 2020-10-01 MED ORDER — CARBAMAZEPINE 100 MG PO CHEW
200.0000 mg | CHEWABLE_TABLET | Freq: Three times a day (TID) | ORAL | Status: DC
  Administered 2020-10-01 – 2020-10-02 (×3): 200 mg via ORAL
  Filled 2020-10-01 (×5): qty 2

## 2020-10-01 MED ORDER — RISPERIDONE 1 MG PO TABS
1.0000 mg | ORAL_TABLET | Freq: Three times a day (TID) | ORAL | Status: DC
  Administered 2020-10-01 – 2020-10-02 (×3): 1 mg via ORAL
  Filled 2020-10-01 (×5): qty 1

## 2020-10-01 MED ORDER — HALOPERIDOL 1 MG PO TABS
1.0000 mg | ORAL_TABLET | Freq: Four times a day (QID) | ORAL | Status: DC | PRN
  Filled 2020-10-01: qty 1

## 2020-10-01 MED ORDER — ZIPRASIDONE MESYLATE 20 MG IM SOLR
20.0000 mg | Freq: Once | INTRAMUSCULAR | Status: AC
  Administered 2020-10-01: 20 mg via INTRAMUSCULAR
  Filled 2020-10-01: qty 20

## 2020-10-01 MED ORDER — CEPHALEXIN 500 MG PO CAPS
500.0000 mg | ORAL_CAPSULE | Freq: Two times a day (BID) | ORAL | Status: DC
  Administered 2020-10-01 – 2020-10-02 (×2): 500 mg via ORAL
  Filled 2020-10-01 (×2): qty 1

## 2020-10-01 MED ORDER — CIPROFLOXACIN HCL 500 MG PO TABS: 500.0000 mg | ORAL_TABLET | Freq: Two times a day (BID) | ORAL | Status: DC

## 2020-10-01 MED ORDER — POTASSIUM CHLORIDE CRYS ER 20 MEQ PO TBCR
40.0000 meq | EXTENDED_RELEASE_TABLET | Freq: Once | ORAL | Status: AC
  Administered 2020-10-01: 14:00:00 40 meq via ORAL
  Filled 2020-10-01: qty 2

## 2020-10-01 MED ORDER — HALOPERIDOL LACTATE 5 MG/ML IJ SOLN
1.0000 mg | Freq: Four times a day (QID) | INTRAMUSCULAR | Status: DC | PRN
  Administered 2020-10-01: 1 mg via INTRAMUSCULAR
  Filled 2020-10-01: qty 1

## 2020-10-01 MED ORDER — CLONAZEPAM 0.25 MG PO TBDP
0.5000 mg | ORAL_TABLET | Freq: Three times a day (TID) | ORAL | Status: DC
  Administered 2020-10-01 – 2020-10-02 (×3): 0.5 mg via ORAL
  Filled 2020-10-01 (×3): qty 2

## 2020-10-01 MED ORDER — DIAZEPAM 5 MG/ML IJ SOLN
5.0000 mg | Freq: Once | INTRAMUSCULAR | Status: AC
  Administered 2020-10-01: 14:00:00 5 mg via INTRAMUSCULAR
  Filled 2020-10-01: qty 2

## 2020-10-01 MED ORDER — HALOPERIDOL LACTATE 5 MG/ML IJ SOLN
1.0000 mg | Freq: Once | INTRAMUSCULAR | Status: AC
  Administered 2020-10-01: 14:00:00 1 mg via INTRAMUSCULAR
  Filled 2020-10-01: qty 1

## 2020-10-01 MED ORDER — HALOPERIDOL 1 MG PO TABS
1.0000 mg | ORAL_TABLET | Freq: Once | ORAL | Status: AC
  Filled 2020-10-01: qty 1

## 2020-10-01 NOTE — Progress Notes (Signed)
Patient has been impulsive since this morning. All medication given has not helped patient relax. He has been up to bathroom over ten times this morning. Came out to nursing station and when assisted to go back to room, he sat down in hallway floor. Notified Dr. Sherryll Burger regarding patient behaviors. No further orders placed.  Legal Guardian - Kathalene Frames notified about patient behaviors and situation. She has given verbal consent over phone for [place] to update those people listed on patient contact list.

## 2020-10-01 NOTE — Progress Notes (Addendum)
Patient has sitter at bedside. Has been up moving in room since 0630 this morning. Patient has removed 3 accesses from arms. He is impulsive, will say he needs to get up and use restroom. Sitter takes patient to bathroom. While in restroom patient began to take three fingers and put them in his rectum (thought he was straining), he quickly brought hand straight to face and put fingers in his mouth. Notified Dr. Sherryll Burger. We have given patient IM haldol and PO meds that would help calm patient down. Per night nurse, patient has not slept all night long. He is currently sitting in chair. Armbands, allergy/fall bands were placed on right lower leg. Patient was attempting to pull armbands off, due to this armbands were cut off. Notified Dr. Sherryll Burger of situation and he is consulting with Dr. Toni Amend regarding patient. No further orders at this time.  While in bathroom patient back hit flush pipe and caused a red area to his left mid back. No tears in skin, just red area.

## 2020-10-01 NOTE — Hospital Course (Signed)
66 year old male with known history of severe intellectual disability, epilepsy with generalized tonic-clonic seizure, hypertension, constipation on daily laxatives.  History of pica/ingestion of known edible products, mostly paper products, anxiety who is a resident of group home is admitted for acute kidney injury  9/21: Nephro consult for AKI 9/22: Patient very impulsive, pulled out 3-4 IV lines.  Psych consult.  AKI improving.  Urine culture growing Klebsiella which is appropriately being treated

## 2020-10-01 NOTE — Progress Notes (Signed)
[facility]  ROUNDING NOTE   Subjective:   Ryan Mayer is a 65 y.o.  male with  epilepsy, severe intellectual disability, and hypertension, who was admitted to [facility] on 09/29/2020 for Shortness of breath [R06.02] AKI (acute kidney injury) (HCC) [N17.9] Acute renal failure, unspecified acute renal failure type (HCC) [N17.9]  Patient seen sitting at side of bed Alert, disoriented  Safety sitter at bedside Completed breakfast tray at bedside   Objective:  Vital signs in last 24 hours:  Temp:  [97.7 F (36.5 C)-98.2 F (36.8 C)] 98.2 F (36.8 C) (09/22 0617) Pulse Rate:  [69-82] 82 (09/22 0617) Resp:  [16-18] 18 (09/22 0617) BP: (110-150)/(67-94) 150/89 (09/22 0617) SpO2:  [97 %-100 %] 99 % (09/22 0617)  Weight change:  There were no vitals filed for this visit.  Intake/Output: I/O last 3 completed shifts: In: 3222.2 [I.V.:3022.2; IV Piggyback:200] Out: -    Intake/Output this shift:  No intake/output data recorded.  Physical Exam: General: NAD, sitting at bedside  Head: Normocephalic, atraumatic. Moist oral mucosal membranes  Eyes: Anicteric  Lungs:  Clear to auscultation, normal effort  Heart: Regular rate and rhythm  Abdomen:  Soft, nontender  Extremities:  no peripheral edema.  Neurologic: Alert, moving all four extremities  Skin: No lesions       Basic Metabolic Panel: Recent Labs  Lab 09/29/20 1600 09/30/20 0203 10/01/20 0412  NA 130* 130*  --   K 3.1* 2.9*  --   CL 95* 101  --   CO2 23 17*  --   GLUCOSE 117* 106*  --   BUN 50* 53*  --   CREATININE 4.61* 4.95* 2.25*  CALCIUM 8.0* 7.3*  --   MG 2.1  --   --     Liver Function Tests: Recent Labs  Lab 09/29/20 1600  AST 30  ALT 20  ALKPHOS 62  BILITOT 0.6  PROT 6.5  ALBUMIN 3.3*   No results for input(s): LIPASE, AMYLASE in the last 168 hours. No results for input(s): AMMONIA in the last 168 hours.  CBC: Recent Labs  Lab 09/29/20 1600 09/30/20 0203  WBC 13.2* 9.8  HGB  11.5* 10.2*  HCT 31.7* 30.8*  MCV 93.2 99.4  PLT 153 140*    Cardiac Enzymes: No results for input(s): CKTOTAL, CKMB, CKMBINDEX, TROPONINI in the last 168 hours.  BNP: Invalid input(s): POCBNP  CBG: No results for input(s): GLUCAP in the last 168 hours.  Microbiology: Results for orders placed or performed during the hospital encounter of 09/29/20  Resp Panel by RT-PCR (Flu A&B, Covid) Nasopharyngeal Swab     Status: None   Collection Time: 09/29/20  9:44 PM   Specimen: Nasopharyngeal Swab; Nasopharyngeal(NP) swabs in vial transport medium  Result Value Ref Range Status   SARS Coronavirus 2 by RT PCR NEGATIVE NEGATIVE Final    Comment: (NOTE) SARS-CoV-2 target nucleic acids are NOT DETECTED.  The SARS-CoV-2 RNA is generally detectable in upper respiratory specimens during the acute phase of infection. The lowest concentration of SARS-CoV-2 viral copies this assay can detect is 138 copies/mL. A negative result does not preclude SARS-Cov-2 infection and should not be used as the sole basis for treatment or other patient management decisions. A negative result may occur with  improper specimen collection/handling, submission of specimen other than nasopharyngeal swab, presence of viral mutation(s) within the areas targeted by this assay, and inadequate number of viral copies(<138 copies/mL). A negative result must be combined with clinical observations, patient  history, and epidemiological information. The expected result is Negative.  Fact Sheet for Patients:  BloggerCourse.com  Fact Sheet for Healthcare Providers:  SeriousBroker.it  This test is no t yet approved or cleared by the [place] FDA and  has been authorized for detection and/or diagnosis of SARS-CoV-2 by FDA under an Emergency Use Authorization (EUA). This EUA will remain  in effect (meaning this test can be used) for the duration of the COVID-19  declaration under Section 564(b)(1) of the Act, 21 U.S.C.section 360bbb-3(b)(1), unless the authorization is terminated  or revoked sooner.       Influenza A by PCR NEGATIVE NEGATIVE Final   Influenza B by PCR NEGATIVE NEGATIVE Final    Comment: (NOTE) The Xpert Xpress SARS-CoV-2/FLU/RSV plus assay is intended as an aid in the diagnosis of influenza from Nasopharyngeal swab specimens and should not be used as a sole basis for treatment. Nasal washings and aspirates are unacceptable for Xpert Xpress SARS-CoV-2/FLU/RSV testing.  Fact Sheet for Patients: BloggerCourse.com  Fact Sheet for Healthcare Providers: SeriousBroker.it  This test is not yet approved or cleared by the [place] FDA and has been authorized for detection and/or diagnosis of SARS-CoV-2 by FDA under an Emergency Use Authorization (EUA). This EUA will remain in effect (meaning this test can be used) for the duration of the COVID-19 declaration under Section 564(b)(1) of the Act, 21 U.S.C. section 360bbb-3(b)(1), unless the authorization is terminated or revoked.  Performed at [facility], [address] [place], [place] [address]     Coagulation Studies: No results for input(s): LABPROT, INR in the last 72 hours.  Urinalysis: Recent Labs    09/30/20 0325  COLORURINE YELLOW  LABSPEC 1.015  PHURINE 5.5  GLUCOSEU NEGATIVE  HGBUR LARGE*  BILIRUBINUR SMALL*  KETONESUR TRACE*  PROTEINUR 100*  NITRITE NEGATIVE  LEUKOCYTESUR SMALL*      Imaging: CT ABDOMEN PELVIS WO CONTRAST  Result Date: 09/30/2020 CLINICAL DATA:  Abdominal distension. EXAM: CT ABDOMEN AND PELVIS WITHOUT CONTRAST TECHNIQUE: Multidetector CT imaging of the abdomen and pelvis was performed following the standard protocol without IV contrast. COMPARISON:  CT abdomen pelvis dated 09/30/2019. FINDINGS: Evaluation of this exam is limited in the absence of intravenous contrast.  Lower chest: There is mild eventration of the right hemidiaphragm with mild right lung base atelectasis. There is coronary vascular calcification. No intra-abdominal free air or free fluid. Hepatobiliary: The liver is unremarkable. No intrahepatic biliary ductal dilatation. Gallstones. No pericholecystic fluid. Pancreas: Unremarkable. No pancreatic ductal dilatation or surrounding inflammatory changes. Spleen: Normal in size without focal abnormality. Adrenals/Urinary Tract: The adrenal glands unremarkable. The kidneys, visualized ureters, and urinary bladder appear unremarkable. Stomach/Bowel: There is large amount of stool throughout the colon. Multiple ingested wires noted within the stomach and throughout the colon. There is no bowel obstruction or active inflammation. The appendix is normal. Vascular/Lymphatic: Mild aortoiliac atherosclerotic disease. The IVC is unremarkable. No portal venous gas. There is no adenopathy. Reproductive: Prostate and seminal vesicles are grossly unremarkable. Other: None Musculoskeletal: Degenerative changes of the spine. No acute osseous pathology. IMPRESSION: 1. No acute intra-abdominal or pelvic pathology. 2. Constipation. No bowel obstruction. Normal appendix. 3. Ingested wires within the stomach and colon. 4. Cholelithiasis. 5. Aortic Atherosclerosis (ICD10-I70.0). Electronically Signed   By: Elgie Collard M.D.   On: 09/30/2020 00:44   CT HEAD WO CONTRAST ( )  Result Date: 09/30/2020 CLINICAL DATA:  Trauma.  Altered mental status. EXAM: CT HEAD WITHOUT CONTRAST TECHNIQUE: Contiguous axial images were obtained from  the base of the skull through the vertex without intravenous contrast. COMPARISON:  Chest CT dated 07/15/2020. FINDINGS: Brain: Mild age-related atrophy chronic microvascular ischemic changes. Areas of old infarct involving the right parietal convexity. There is no acute intracranial hemorrhage. Mass effect or midline shift. No extra-axial fluid collection.  Vascular: No hyperdense vessel or unexpected calcification. Skull: Normal. Negative for fracture or focal lesion. Sinuses/Orbits: No acute finding. Other: None IMPRESSION: 1. No acute intracranial pathology. 2. Mild age-related atrophy chronic microvascular ischemic changes. 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 Electronically Signed   By: Elgie Collard M.D.   On: 09/30/2020 00:37   DG Chest Port 1 View  Result Date: 09/30/2020 CLINICAL DATA:  Shortness of breath. EXAM: PORTABLE CHEST 1 VIEW COMPARISON:  Chest radiograph dated 07/15/2020. FINDINGS: Shallow inspiration with bibasilar atelectasis. No focal consolidation, pleural effusion, or pneumothorax. The cardiac silhouette is within limits. Atherosclerotic calcification of the aorta. Ingested wires in the stomach. No acute osseous pathology. IMPRESSION: No active disease. Electronically Signed   By: Elgie Collard M.D.   On: 09/30/2020 02:12     Medications:    sodium chloride 125 mL/hr at 10/01/20 0128   cefTRIAXone (ROCEPHIN)  IV Stopped (10/01/20 0000)    enoxaparin (LOVENOX) injection  30 mg Subcutaneous Q24H   escitalopram  10 mg Oral Daily   LORazepam  1 mg Oral BID   pantoprazole  40 mg Oral Daily   risperiDONE  1 mg Oral BID   traZODone  125 mg Oral QHS   traZODone  50 mg Oral BID   acetaminophen **OR** acetaminophen, haloperidol **OR** haloperidol lactate, hydrOXYzine, LORazepam, ondansetron **OR** ondansetron (ZOFRAN) IV  Assessment/ Plan:  Ryan Mayer is a 65 y.o.  male with  epilepsy, severe intellectual disability, and hypertension, who was admitted to [facility] on 09/29/2020 for Shortness of breath [R06.02] AKI (acute kidney injury) (HCC) [N17.9] Acute renal failure, unspecified acute renal failure type (HCC) [N17.9]   Acute kidney injury of unknown etiology. IV Contrast exposure on 09/07/20, but meaningful recovery would have occurred. Reports of laxative use, possible dehydration. Outpatient Creatinine 4.3 on  admission. Baseline creatinine of 0.94 on 09/07/20. CT abdomen/pelvis negative for renal obstruction. Creatinine greatly improved with increased oral intake. Mentation improved also. IVF discontinued.   Lab Results  Component Value Date   CREATININE 2.25 (H) 10/01/2020   CREATININE 4.95 (H) 09/30/2020   CREATININE 4.61 (H) 09/29/2020    Intake/Output Summary (Last 24 hours) at 10/01/2020 1023 Last data filed at 10/01/2020 0300 Gross per 24 hour  Intake 3222.24 ml  Output --  Net 3222.24 ml   2. Hypertension Home regimen includes amlodipine, clonidine, carvedilol, and lisinopril. Was hypotensive on admission. BP stable 150/89  3. Hypokalemia 2.9 on admission, awaiting morning labs, patient uncooperative with lab draws. Will improve with oral intake. No reports of diarrhea, nausea and vomiting    LOS: 1   9/22/202210:23 AM

## 2020-10-01 NOTE — Consult Note (Signed)
Psychiatry consult: Brief consult based on chart review and interaction with treatment team.  65 year old man with a history of severe intellectual disability.  Based on everything I see in the charts it sounds like he does not have verbal ability to provide history.  Admitted to the hospital with some abnormal lab values.  Patient has been agitated uncooperative striking out at staff and engaging in dangerous behavior on the ward.  Reviewed lab studies including most recent EKG.  Reviewed history of allergies.  Ordered a one-time dose of Geodon 20 mg IM as well as diazepam 5 mg IM.  Added clonazepam 0.5 mg 3 times a day to assist with anxiety and agitation while in the hospital.  Restarted Tegretol for his seizure disorder.  Added Geodon IM orders for as needed in the future.  We will follow as needed.  I attempted to reach someone from his group home and his legal guardian to see if they could give any specific advice about what would be soothing for him.  Left voicemail messages with my phone number but have not yet heard back.

## 2020-10-01 NOTE — Progress Notes (Signed)
1       [place] at [facility]   PATIENT NAME: Ryan Mayer    MR#:  [id number]  PCP: Lauro Regulus, MD  DATE OF BIRTH:  1955/10/20  SUBJECTIVE:  CHIEF COMPLAINT:   Chief Complaint  Patient presents with   Altered Mental Status  Patient sitting in the chair and trying to get up and keep pointing to his right ear.  Sitter at bedside REVIEW OF SYSTEMS:  ROS unable to evaluate due to his mental status DRUG ALLERGIES:  No Known Allergies VITALS:  Blood pressure (!) 150/89, pulse 82, temperature 98.2 F (36.8 C), resp. rate 18, SpO2 99 %. PHYSICAL EXAMINATION:  Physical Exam 65 year old male sitting in the chair comfortably without any acute distress Eyes pupil equal round reactive to light accommodation, no scleral icterus Lungs clear to auscultation bilaterally, no wheezing rales rhonchi or crepitation Abdomen soft, benign Cardiovascular S1-S2 normal, no murmur rales or gallop Neuro alert and awake, nonfocal Skin no rash or lesion Psych: Impulsive LABORATORY PANEL:  Male CBC Recent Labs  Lab 09/30/20 0203  WBC 9.8  HGB 10.2*  HCT 30.8*  PLT 140*   ------------------------------------------------------------------------------------------------------------------ Chemistries  Recent Labs  Lab 09/29/20 1600 09/30/20 0203 10/01/20 0412  NA 130* 130*  --   K 3.1* 2.9*  --   CL 95* 101  --   CO2 23 17*  --   GLUCOSE 117* 106*  --   BUN 50* 53*  --   CREATININE 4.61* 4.95* 2.25*  CALCIUM 8.0* 7.3*  --   MG 2.1  --   --   AST 30  --   --   ALT 20  --   --   ALKPHOS 62  --   --   BILITOT 0.6  --   --    MEDICATIONS:  Scheduled Meds:  enoxaparin (LOVENOX) injection  30 mg Subcutaneous Q24H   escitalopram  10 mg Oral Daily   LORazepam  1 mg Oral BID   pantoprazole  40 mg Oral Daily   potassium chloride  40 mEq Oral Once   risperiDONE  1 mg Oral TID   traZODone  125 mg Oral QHS   traZODone  50 mg Oral BID   Continuous Infusions:  sodium  chloride Stopped (10/01/20 0200)   cefTRIAXone (ROCEPHIN)  IV Stopped (10/01/20 0000)   RADIOLOGY:  No results found. ASSESSMENT AND PLAN:  66 year old male with known history of severe intellectual disability, epilepsy with generalized tonic-clonic seizure, hypertension, constipation on daily laxatives.  History of pica/ingestion of known edible products, mostly paper products, anxiety who is a resident of group home is admitted for acute kidney injury  9/21: Nephro consult for AKI 9/22: Patient very impulsive, pulled out 3-4 IV lines.  Psych consult.  AKI improving.  Urine culture growing Klebsiella which is appropriately being treated  Active Problems:   AKI (acute kidney injury) (HCC)   Intellectual disability   Foreign body ingestion   Hypotension   Hyponatremia   Epilepsy (HCC)  AKI Present on admission as evidenced by creatinine of 4.6 with a baseline creatinine of 0.94 about 3 weeks prior -Creatinine improving with IV hydration and is 2.25 today  Hypokalemia Replete and recheck, likely due to poor p.o. intake  Klebsiella UTI Based on urine culture, on IV Rocephin  Anxiety/impulsivity On trazodone, Lexapro, Risperdal, lorazepam scheduled and as needed Also as needed Haldol intramuscular added today as patient has pulled out 3 IVs Will consult psychiatry -Dr. Toni Amend  aware Safety sitter one-on-one in place  Hypotension with a history of hypertension Likely due to dehydration.  Sepsis ruled out Now resolved.  Acute metabolic encephalopathy On admission he was very lethargic.  Now he is awake and more impulsive.  Based on the description his caregiver gave me he looks close to his baseline now Unsure of exact etiology could be infection and or severe dehydration  Hyponatremia Improving with hydration  History of epilepsy Not on AED   There is no height or weight on file to calculate BMI.  Net IO Since Admission: 3,222.24 mL [10/01/20 1128]      LOS: 1 day    Consultants: Nephrology    Antibiotics: Rocephin  Status is: Inpatient  Remains inpatient appropriate because:Inpatient level of care appropriate due to severity of illness  Dispo: The patient is from: Group home              Anticipated d/c is to: Group home              Patient currently is not medically stable to d/c.   Difficult to place patient No    DVT prophylaxis:       enoxaparin (LOVENOX) injection 30 mg Start: 09/30/20 0800     Family Communication: Discussed with patient's caregiver Rhonda at group home on phone   All the records are reviewed and case discussed with Nursing and TOC team. Management plans discussed with the patient, family and they are in agreement.  CODE STATUS: Full Code Level of care: Med-Surg  TOTAL TIME TAKING CARE OF THIS PATIENT: 20 minutes.   More than 50% of the time was spent in counseling/coordination of care: YES  POSSIBLE D/C IN 1-2 DAYS, DEPENDING ON CLINICAL CONDITION.   Delfino Lovett M.D on 10/01/2020 at 11:28 AM  [facility]   CC: Primary care physician; Lauro Regulus, MD  Note: This dictation was prepared with Dragon dictation along with smaller phrase technology. Any transcriptional errors that result from this process are unintentional.

## 2020-10-02 ENCOUNTER — Inpatient Hospital Stay: Payer: Medicare Other

## 2020-10-02 LAB — CBC
HCT: 30.7 % — ABNORMAL LOW (ref 39.0–52.0)
Hemoglobin: 11.4 g/dL — ABNORMAL LOW (ref 13.0–17.0)
MCH: 33.9 pg (ref 26.0–34.0)
MCHC: 37.1 g/dL — ABNORMAL HIGH (ref 30.0–36.0)
MCV: 91.4 fL (ref 80.0–100.0)
Platelets: 232 10*3/uL (ref 150–400)
RBC: 3.36 MIL/uL — ABNORMAL LOW (ref 4.22–5.81)
RDW: 12.3 % (ref 11.5–15.5)
WBC: 7.4 10*3/uL (ref 4.0–10.5)
nRBC: 0 % (ref 0.0–0.2)

## 2020-10-02 LAB — BASIC METABOLIC PANEL
Anion gap: 7 (ref 5–15)
BUN: 26 mg/dL — ABNORMAL HIGH (ref 8–23)
CO2: 24 mmol/L (ref 22–32)
Calcium: 8.3 mg/dL — ABNORMAL LOW (ref 8.9–10.3)
Chloride: 106 mmol/L (ref 98–111)
Creatinine, Ser: 1.24 mg/dL (ref 0.61–1.24)
GFR, Estimated: >60 mL/min (ref >60)
Glucose, Bld: 140 mg/dL — ABNORMAL HIGH (ref 70–99)
Potassium: 2.9 mmol/L — ABNORMAL LOW (ref 3.5–5.1)
Sodium: 137 mmol/L (ref 135–145)

## 2020-10-02 LAB — PROCALCITONIN: Procalcitonin: 0.11 ng/mL

## 2020-10-02 MED ORDER — POTASSIUM CHLORIDE CRYS ER 20 MEQ PO TBCR
60.0000 meq | EXTENDED_RELEASE_TABLET | Freq: Once | ORAL | Status: AC
  Administered 2020-10-02: 12:00:00 60 meq via ORAL
  Filled 2020-10-02: qty 3

## 2020-10-02 MED ORDER — CEPHALEXIN 500 MG PO CAPS: 500.0000 mg | ORAL_CAPSULE | Freq: Two times a day (BID) | ORAL | 0 refills | Status: DC

## 2020-10-02 MED ORDER — POTASSIUM CHLORIDE CRYS ER 20 MEQ PO TBCR: 20.0000 meq | EXTENDED_RELEASE_TABLET | Freq: Every day | ORAL | 0 refills | Status: AC

## 2020-10-02 NOTE — Discharge Summary (Addendum)
Physician Discharge Summary  DAKHARI ZUVER ZOX:[id number] DOB: [birth year] DOA: 09/29/2020  PCP: Lauro Regulus, MD  Admit date: 09/29/2020 Discharge date: 10/02/2020  Admitted From: Home  Disposition:  Home   Recommendations for Outpatient Follow-up:  Follow up with Dr. Dareen Piano PCP in 1 week Dr. Dareen Piano: Obtain BMP in 1 week to re-evaluate Cr and K Dr. Dareen Piano: Check BP and restart BP meds as needed      Home Health: None  Equipment/Devices: None new  Discharge Condition: good  CODE STATUS: FULL Diet recommendation: Regular  Brief/Interim Summary: Mr. Ryan Mayer is a 65 y.o. M with IDD, seizures, HTN who presented with few days sluggishness to his PCP, found to have ARF.  Baseline Cr few weeks ago was 0.8 mg/dL, on day of admission Cr 4.61 mg/dL.  BP soft, Na 130.  Started on fluids and admitted.      PRINCIPAL HOSPITAL DIAGNOSIS: Acute renal failure    Discharge Diagnoses:   Acute renal failure Not obstructive by imaging.  IV contrast exposure preceded ARF by too much.  Likely main driver (given improvement with fluids) was hypovolemic/ischemic injury.  Lisinopril and Diamox held, given IVF, Cr today 1.2.  Stop lisinopril, HCTZ and Diamox.   Hypertension Continue home amlodipine and Coreg Check BP and resume HCTZ and clonidine if needed Hold lisinopril until PCP follow up.  Foreign body ingestion Noted to have small foreign body on imaging.  On day of discharge he defecated a small plastic bag.   Repeat KUB attempted but not possible with patient behaviors.  Intellectual developmental delay Patient very impulsive, pulled out 3-4 IV lines.   I agree with Dr. Sherryll Burger, his agitation and difficult to control behaviors are expected given IDD and the current surroundings/treatments.  Agree with Psychiatry, gentle sedatives are our only safe option to provide therapy to his renal failure.  Avoid benzodiazepines.    Epilepsy Continue  Tegretol  Hyponatremia Resolved with fluid  UTI ruled out  Hypokalemia Initially improved, then recurred with IV fluids.  Still some asymptomatic hypokalemia at discharge.  Discharged with 5 days K supplement and follow up with PCP in 1 week.             Discharge Instructions  Discharge Instructions     Discharge instructions   Complete by: As directed    Take Baldwin to his PCP in 1 week Ask PCP to check kidney function (obtain a "Basic metabolic panel") In the meantime, make blood pressure medicine adjustments as listed below   Increase activity slowly   Complete by: As directed       Allergies as of 10/02/2020   No Known Allergies      Medication List     STOP taking these medications    acetaZOLAMIDE ER 500 MG capsule Commonly known as: DIAMOX   alum & mag hydroxide-simeth 200-200-20 MG/5ML suspension Commonly known as: MAALOX/MYLANTA   cloNIDine 0.1 MG tablet Commonly known as: CATAPRES   lisinopril 20 MG tablet Commonly known as: ZESTRIL   lisinopril-hydrochlorothiazide 20-12.5 MG tablet Commonly known as: ZESTORETIC       TAKE these medications    acetaminophen 325 MG tablet Commonly known as: TYLENOL Take 650 mg by mouth every 4 (four) hours as needed for fever.   amLODipine 5 MG tablet Commonly known as: NORVASC Take 5 mg by mouth daily.   aspirin EC 81 MG tablet Take 81 mg by mouth daily. Swallow whole.   atropine 1 % ophthalmic solution Place 1 drop  into the left eye 6 (six) times daily.   bisacodyl 5 MG EC tablet Commonly known as: DULCOLAX Take 10 mg by mouth every 12 (twelve) hours as needed for severe constipation.   brimonidine 0.2 % ophthalmic solution Commonly known as: ALPHAGAN Place 1 drop into the left eye 3 (three) times daily.   camphor-menthol lotion Commonly known as: SARNA Apply 1 application topically as needed for itching.   carbamazepine 200 MG tablet Commonly known as: TEGRETOL Take 200 mg by mouth  3 (three) times daily.   carbamide peroxide 6.5 % OTIC solution Commonly known as: DEBROX Place 5-10 drops into both ears every Friday.   carvedilol 25 MG tablet Commonly known as: COREG Take 25 mg by mouth 2 (two) times daily with a meal.   cetaphil cream Apply 1 application topically as directed. Apply after bath   cetirizine 10 MG tablet Commonly known as: ZYRTEC Take 10 mg by mouth 2 (two) times daily.   chlorhexidine 0.12 % solution Commonly known as: PERIDEX Use as directed 5 mLs in the mouth or throat 2 (two) times daily.   diphenhydrAMINE 25 mg capsule Commonly known as: BENADRYL Take 25 mg by mouth every 6 (six) hours as needed.   docusate sodium 100 MG capsule Commonly known as: COLACE Take 100 mg by mouth 3 (three) times daily as needed for mild constipation or moderate constipation.   dorzolamide-timolol 22.3-6.8 MG/ML ophthalmic solution Commonly known as: COSOPT Place 1 drop into the left eye 2 (two) times daily.   escitalopram 10 MG tablet Commonly known as: LEXAPRO Take 10 mg by mouth daily.   fluticasone 50 MCG/ACT nasal spray Commonly known as: FLONASE Place 1 spray into both nostrils 2 (two) times daily.   guaiFENesin 100 MG/5ML Soln Commonly known as: ROBITUSSIN Take 5 mLs by mouth every 4 (four) hours as needed for cough.   hydrOXYzine 50 MG tablet Commonly known as: ATARAX/VISTARIL Take 50 mg by mouth 3 (three) times daily as needed for itching or anxiety.   latanoprost 0.005 % ophthalmic solution Commonly known as: XALATAN Place 1 drop into the left eye at bedtime.   levocetirizine 5 MG tablet Commonly known as: XYZAL Take 5 mg by mouth at bedtime.   loperamide 2 MG capsule Commonly known as: IMODIUM Take 2-4 mg by mouth See admin instructions. Take 2 capsules (4mg ) by mouth after first loose stool then take 1 capsule (2mg ) by mouth after each additional loose stool (PRN)   LORazepam 1 MG tablet Commonly known as: ATIVAN Take 1 mg  by mouth daily as needed for anxiety or sedation.   LORazepam 1 MG tablet Commonly known as: ATIVAN Take 1 mg by mouth in the morning and at bedtime. (1700 & 2000)   metroNIDAZOLE 0.75 % cream Commonly known as: METROCREAM Apply 1 application topically at bedtime. (Apply to face)   naltrexone 50 MG tablet Commonly known as: DEPADE Take 50 mg by mouth daily.   neomycin-bacitracin-polymyxin ointment Commonly known as: NEOSPORIN Apply 1 application topically daily as needed for wound care.   pantoprazole 40 MG tablet Commonly known as: PROTONIX Take 40 mg by mouth daily.   polyethylene glycol 17 g packet Commonly known as: MIRALAX / GLYCOLAX Take 17 g by mouth daily.   potassium chloride SA 20 MEQ tablet Commonly known as: KLOR-CON Take 1 tablet (20 mEq total) by mouth daily.   prednisoLONE acetate 1 % ophthalmic suspension Commonly known as: PRED FORTE Place 1 drop into the left eye 6 (  six) times daily.   risperiDONE 0.5 MG tablet Commonly known as: RISPERDAL Take 1 mg by mouth See admin instructions. Take 2 tablets (1 mg) by mouth every morning and 2 tablets (1 mg) at bedtime   tolnaftate 1 % cream Commonly known as: TINACTIN Apply 1 application topically 2 (two) times daily as needed (itchy feet).   Toviaz 8 MG Tb24 tablet Generic drug: fesoterodine Take 8 mg by mouth daily.   traZODone 50 MG tablet Commonly known as: DESYREL Take 50-125 mg by mouth 3 (three) times daily. Take one 50 mg tablet by mouth every morning, one 50 mg tablet at 2 PM, and two and one-half tablets 125 mg at bedtime        Follow-up Information     Lauro Regulus, MD. Go on 10/06/2020.   Specialty: Internal Medicine Why: @11 :15am Contact information: [address] [place] [phone]                No Known Allergies  Consultations: Nephrology   Procedures/Studies: CT ABDOMEN PELVIS WO CONTRAST  Result Date:  09/30/2020 CLINICAL DATA:  Abdominal distension. EXAM: CT ABDOMEN AND PELVIS WITHOUT CONTRAST TECHNIQUE: Multidetector CT imaging of the abdomen and pelvis was performed following the standard protocol without IV contrast. COMPARISON:  CT abdomen pelvis dated 09/30/2019. FINDINGS: Evaluation of this exam is limited in the absence of intravenous contrast. Lower chest: There is mild eventration of the right hemidiaphragm with mild right lung base atelectasis. There is coronary vascular calcification. No intra-abdominal free air or free fluid. Hepatobiliary: The liver is unremarkable. No intrahepatic biliary ductal dilatation. Gallstones. No pericholecystic fluid. Pancreas: Unremarkable. No pancreatic ductal dilatation or surrounding inflammatory changes. Spleen: Normal in size without focal abnormality. Adrenals/Urinary Tract: The adrenal glands unremarkable. The kidneys, visualized ureters, and urinary bladder appear unremarkable. Stomach/Bowel: There is large amount of stool throughout the colon. Multiple ingested wires noted within the stomach and throughout the colon. There is no bowel obstruction or active inflammation. The appendix is normal. Vascular/Lymphatic: Mild aortoiliac atherosclerotic disease. The IVC is unremarkable. No portal venous gas. There is no adenopathy. Reproductive: Prostate and seminal vesicles are grossly unremarkable. Other: None Musculoskeletal: Degenerative changes of the spine. No acute osseous pathology. IMPRESSION: 1. No acute intra-abdominal or pelvic pathology. 2. Constipation. No bowel obstruction. Normal appendix. 3. Ingested wires within the stomach and colon. 4. Cholelithiasis. 5. Aortic Atherosclerosis (ICD10-I70.0). Electronically Signed   By: 10/02/2019 M.D.   On: 09/30/2020 00:44   DG Wrist Complete Right  Result Date: 09/07/2020 CLINICAL DATA:  Unwitnessed fall EXAM: RIGHT WRIST - COMPLETE 3+ VIEW COMPARISON:  None FINDINGS: Osseous mineralization low normal.  Degenerative changes first [facility] joint with joint space narrowing and spur formation. Remaining joint spaces preserved. No acute fracture, dislocation, or bone destruction IMPRESSION: No acute osseous abnormalities. Electronically Signed   By: [facility] M.D.   On: 09/07/2020 16:18   CT HEAD WO CONTRAST (09/09/2020)  Result Date: 09/30/2020 CLINICAL DATA:  Trauma.  Altered mental status. EXAM: CT HEAD WITHOUT CONTRAST TECHNIQUE: Contiguous axial images were obtained from the base of the skull through the vertex without intravenous contrast. COMPARISON:  Chest CT dated 07/15/2020. FINDINGS: Brain: Mild age-related atrophy chronic microvascular ischemic changes. Areas of old infarct involving the right parietal convexity. There is no acute intracranial hemorrhage. Mass effect or midline shift. No extra-axial fluid collection. Vascular: No hyperdense vessel or unexpected calcification. Skull: Normal. Negative for fracture or focal  lesion. Sinuses/Orbits: No acute finding. Other: None IMPRESSION: 1. No acute intracranial pathology. 2. Mild age-related atrophy chronic microvascular ischemic changes. 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 Electronically Signed   By: Elgie Collard M.D.   On: 09/30/2020 00:37   CT HEAD WO CONTRAST  Result Date: 09/07/2020 CLINICAL DATA:  Status post fall.  Seizure disorder.  Hypertension. EXAM: CT HEAD WITHOUT CONTRAST CT MAXILLOFACIAL WITHOUT CONTRAST CT CERVICAL SPINE WITHOUT CONTRAST TECHNIQUE: Multidetector CT imaging of the head, cervical spine, and maxillofacial structures were performed using the standard protocol without intravenous contrast. Multiplanar CT image reconstructions of the cervical spine and maxillofacial structures were also generated. COMPARISON:  None. FINDINGS: CT HEAD FINDINGS BRAIN: BRAIN Patchy and confluent areas of decreased attenuation are noted throughout the deep and periventricular white matter of the cerebral hemispheres bilaterally, compatible with  chronic microvascular ischemic disease. Chronic right frontoparietal infarction again noted. Chronic small left frontal infarction. No evidence of large-territorial acute infarction. No parenchymal hemorrhage. No mass lesion. No extra-axial collection. No mass effect or midline shift. No hydrocephalus. Basilar cisterns are patent. Vascular: No hyperdense vessel. Skull: No acute fracture or focal lesion. Other: None. CT MAXILLOFACIAL FINDINGS Osseous: No fracture or mandibular dislocation. No destructive process. Sinuses/Orbits: Paranasal sinuses and mastoid air cells are clear. The orbits are unremarkable. Soft tissues: Left periorbital subcutaneus soft tissue edema/hematoma formation. CT CERVICAL SPINE FINDINGS Alignment: Normal. Skull base and vertebrae: Multilevel degenerative changes of the spine. Associated multilevel moderate severe osseous neural foraminal stenosis. No severe central canal stenosis. No acute fracture. No aggressive appearing focal osseous lesion or focal pathologic process. Soft tissues and spinal canal: No prevertebral fluid or swelling. No visible canal hematoma. Upper chest: Unremarkable. Other: None. IMPRESSION: 1. Negative for acute traumatic injury. 2. Left periorbital subcutaneus soft tissue hematoma with no underlying acute displaced facial fracture. 3. No acute displaced fracture or traumatic listhesis of the cervical spine. Electronically Signed   By: Tish Frederickson M.D.   On: 09/07/2020 16:12   CT Angio Chest PE W and/or Wo Contrast  Result Date: 09/07/2020 CLINICAL DATA:  PE suspected, unwitnessed fall EXAM: CT ANGIOGRAPHY CHEST WITH CONTRAST TECHNIQUE: Multidetector CT imaging of the chest was performed using the standard protocol during bolus administration of intravenous contrast. Multiplanar CT image reconstructions and MIPs were obtained to evaluate the vascular anatomy. CONTRAST:  75mL OMNIPAQUE IOHEXOL 350 MG/ML SOLN COMPARISON:  None. FINDINGS: Cardiovascular:  Satisfactory opacification of the pulmonary arteries to the segmental level. No evidence of pulmonary embolism. Normal heart size. Left coronary artery calcifications. No pericardial effusion. Aortic atherosclerosis. Mediastinum/Nodes: No enlarged mediastinal, hilar, or axillary lymph nodes. Thyroid gland, trachea, and esophagus demonstrate no significant findings. Lungs/Pleura: Lungs are clear. No pleural effusion or pneumothorax. Upper Abdomen: No acute abnormality. Layering gallstones and/or sludge in the gallbladder. Musculoskeletal: No chest wall abnormality. No acute or significant osseous findings. Review of the MIP images confirms the above findings. IMPRESSION: 1. Negative examination for pulmonary embolism. 2. Coronary artery disease. 3. Layering gallstones and/or sludge in the gallbladder. Aortic Atherosclerosis (ICD10-I70.0). Electronically Signed   By: Lauralyn Primes M.D.   On: 09/07/2020 16:11   CT Cervical Spine Wo Contrast  Result Date: 09/07/2020 CLINICAL DATA:  Status post fall.  Seizure disorder.  Hypertension. EXAM: CT HEAD WITHOUT CONTRAST CT MAXILLOFACIAL WITHOUT CONTRAST CT CERVICAL SPINE WITHOUT CONTRAST TECHNIQUE: Multidetector CT imaging of the head, cervical spine, and maxillofacial structures were performed using the standard protocol without intravenous contrast. Multiplanar CT image reconstructions of the cervical spine  and maxillofacial structures were also generated. COMPARISON:  None. FINDINGS: CT HEAD FINDINGS BRAIN: BRAIN Patchy and confluent areas of decreased attenuation are noted throughout the deep and periventricular white matter of the cerebral hemispheres bilaterally, compatible with chronic microvascular ischemic disease. Chronic right frontoparietal infarction again noted. Chronic small left frontal infarction. No evidence of large-territorial acute infarction. No parenchymal hemorrhage. No mass lesion. No extra-axial collection. No mass effect or midline shift. No  hydrocephalus. Basilar cisterns are patent. Vascular: No hyperdense vessel. Skull: No acute fracture or focal lesion. Other: None. CT MAXILLOFACIAL FINDINGS Osseous: No fracture or mandibular dislocation. No destructive process. Sinuses/Orbits: Paranasal sinuses and mastoid air cells are clear. The orbits are unremarkable. Soft tissues: Left periorbital subcutaneus soft tissue edema/hematoma formation. CT CERVICAL SPINE FINDINGS Alignment: Normal. Skull base and vertebrae: Multilevel degenerative changes of the spine. Associated multilevel moderate severe osseous neural foraminal stenosis. No severe central canal stenosis. No acute fracture. No aggressive appearing focal osseous lesion or focal pathologic process. Soft tissues and spinal canal: No prevertebral fluid or swelling. No visible canal hematoma. Upper chest: Unremarkable. Other: None. IMPRESSION: 1. Negative for acute traumatic injury. 2. Left periorbital subcutaneus soft tissue hematoma with no underlying acute displaced facial fracture. 3. No acute displaced fracture or traumatic listhesis of the cervical spine. Electronically Signed   By: Tish Frederickson M.D.   On: 09/07/2020 16:12   DG Chest Port 1 View  Result Date: 09/30/2020 CLINICAL DATA:  Shortness of breath. EXAM: PORTABLE CHEST 1 VIEW COMPARISON:  Chest radiograph dated 07/15/2020. FINDINGS: Shallow inspiration with bibasilar atelectasis. No focal consolidation, pleural effusion, or pneumothorax. The cardiac silhouette is within limits. Atherosclerotic calcification of the aorta. Ingested wires in the stomach. No acute osseous pathology. IMPRESSION: No active disease. Electronically Signed   By: Elgie Collard M.D.   On: 09/30/2020 02:12   DG Humerus Right  Result Date: 09/07/2020 CLINICAL DATA:  Unwitnessed fall EXAM: RIGHT HUMERUS - 2+ VIEW COMPARISON:  None FINDINGS: Osseous mineralization normal. Joint spaces preserved. No fracture, dislocation, or bone destruction. IMPRESSION:  Normal exam. Electronically Signed   By: Ulyses Southward M.D.   On: 09/07/2020 16:16   CT Maxillofacial Wo Contrast  Result Date: 09/07/2020 CLINICAL DATA:  Status post fall.  Seizure disorder.  Hypertension. EXAM: CT HEAD WITHOUT CONTRAST CT MAXILLOFACIAL WITHOUT CONTRAST CT CERVICAL SPINE WITHOUT CONTRAST TECHNIQUE: Multidetector CT imaging of the head, cervical spine, and maxillofacial structures were performed using the standard protocol without intravenous contrast. Multiplanar CT image reconstructions of the cervical spine and maxillofacial structures were also generated. COMPARISON:  None. FINDINGS: CT HEAD FINDINGS BRAIN: BRAIN Patchy and confluent areas of decreased attenuation are noted throughout the deep and periventricular white matter of the cerebral hemispheres bilaterally, compatible with chronic microvascular ischemic disease. Chronic right frontoparietal infarction again noted. Chronic small left frontal infarction. No evidence of large-territorial acute infarction. No parenchymal hemorrhage. No mass lesion. No extra-axial collection. No mass effect or midline shift. No hydrocephalus. Basilar cisterns are patent. Vascular: No hyperdense vessel. Skull: No acute fracture or focal lesion. Other: None. CT MAXILLOFACIAL FINDINGS Osseous: No fracture or mandibular dislocation. No destructive process. Sinuses/Orbits: Paranasal sinuses and mastoid air cells are clear. The orbits are unremarkable. Soft tissues: Left periorbital subcutaneus soft tissue edema/hematoma formation. CT CERVICAL SPINE FINDINGS Alignment: Normal. Skull base and vertebrae: Multilevel degenerative changes of the spine. Associated multilevel moderate severe osseous neural foraminal stenosis. No severe central canal stenosis. No acute fracture. No aggressive appearing focal osseous lesion or focal  pathologic process. Soft tissues and spinal canal: No prevertebral fluid or swelling. No visible canal hematoma. Upper chest: Unremarkable.  Other: None. IMPRESSION: 1. Negative for acute traumatic injury. 2. Left periorbital subcutaneus soft tissue hematoma with no underlying acute displaced facial fracture. 3. No acute displaced fracture or traumatic listhesis of the cervical spine. Electronically Signed   By: Tish Frederickson M.D.   On: 09/07/2020 16:12      Subjective: Patient is nonverbal essentially.  No fever overnight.  UOP good.  Defecated a small bag.  No visible pain complaints.  No hematochezia.  Discharge Exam: Vitals:   10/01/20 2010 10/02/20 0500  BP: (!) 161/93 (!) 148/89  Pulse: (!) 101 95  Resp: 18 18  Temp: 98.2 F (36.8 C) 98 F (36.7 C)  SpO2: 93% 95%   Vitals:   10/01/20 0617 10/01/20 1319 10/01/20 2010 10/02/20 0500  BP: (!) 150/89 (!) 160/109 (!) 161/93 (!) 148/89  Pulse: 82 (!) 106 (!) 101 95  Resp: Temp: 98.2 F (36.8 C) 98.2 F (36.8 C) 98.2 F (36.8 C) 98 F (36.7 C)  TempSrc:  Oral  Oral  SpO2: 99% 91% 93% 95%    General: Pt is alert, awake, not in acute distress Cardiovascular: RRR, nl S1-S2, no murmurs appreciated.   No LE edema.   Respiratory: Normal respiratory rate and rhythm.  CTAB without rales or wheezes. Abdominal: Abdomen soft and non-tender.  No distension or HSM.   Neuro/Psych: Strength symmetric in upper and lower extremities.  Judgment and insight appear congenitally imapired.   The results of significant diagnostics from this hospitalization (including imaging, microbiology, ancillary and laboratory) are listed below for reference.     Microbiology: Recent Results (from the past 240 hour(s))  Resp Panel by RT-PCR (Flu A&B, Covid) Nasopharyngeal Swab     Status: None   Collection Time: 09/29/20  9:44 PM   Specimen: Nasopharyngeal Swab; Nasopharyngeal(NP) swabs in vial transport medium  Result Value Ref Range Status   SARS Coronavirus 2 by RT PCR NEGATIVE NEGATIVE Final    Comment: (NOTE) SARS-CoV-2 target nucleic acids are NOT DETECTED.  The  SARS-CoV-2 RNA is generally detectable in upper respiratory specimens during the acute phase of infection. The lowest concentration of SARS-CoV-2 viral copies this assay can detect is 138 copies/mL. A negative result does not preclude SARS-Cov-2 infection and should not be used as the sole basis for treatment or other patient management decisions. A negative result may occur with  improper specimen collection/handling, submission of specimen other than nasopharyngeal swab, presence of viral mutation(s) within the areas targeted by this assay, and inadequate number of viral copies(<138 copies/mL). A negative result must be combined with clinical observations, patient history, and epidemiological information. The expected result is Negative.  Fact Sheet for Patients:  BloggerCourse.com  Fact Sheet for Healthcare Providers:  SeriousBroker.it  This test is no t yet approved or cleared by the [place] FDA and  has been authorized for detection and/or diagnosis of SARS-CoV-2 by FDA under an Emergency Use Authorization (EUA). This EUA will remain  in effect (meaning this test can be used) for the duration of the COVID-19 declaration under Section 564(b)(1) of the Act, 21 U.S.C.section 360bbb-3(b)(1), unless the authorization is terminated  or revoked sooner.       Influenza A by PCR NEGATIVE NEGATIVE Final   Influenza B by PCR NEGATIVE NEGATIVE Final    Comment: (NOTE) The Xpert Xpress SARS-CoV-2/FLU/RSV plus assay is intended as  an aid in the diagnosis of influenza from Nasopharyngeal swab specimens and should not be used as a sole basis for treatment. Nasal washings and aspirates are unacceptable for Xpert Xpress SARS-CoV-2/FLU/RSV testing.  Fact Sheet for Patients: BloggerCourse.com  Fact Sheet for Healthcare Providers: SeriousBroker.it  This test is not yet approved or  cleared by the [place] FDA and has been authorized for detection and/or diagnosis of SARS-CoV-2 by FDA under an Emergency Use Authorization (EUA). This EUA will remain in effect (meaning this test can be used) for the duration of the COVID-19 declaration under Section 564(b)(1) of the Act, 21 U.S.C. section 360bbb-3(b)(1), unless the authorization is terminated or revoked.  Performed at [facility], [address] [place], [place] [address]      Labs: BNP (last 3 results) No results for input(s): BNP in the last 8760 hours. Basic Metabolic Panel: Recent Labs  Lab 09/29/20 1600 09/30/20 0203 10/01/20 0412 10/01/20 1114 10/02/20 0854  NA 130* 130*  --  135 137  K 3.1* 2.9*  --  3.1* 2.9*  CL 95* 101  --  106 106  CO2 23 17*  --  19* 24  GLUCOSE 117* 106*  --  124* 140*  BUN 50* 53*  --  41* 26*  CREATININE 4.61* 4.95* 2.25* 1.90* 1.24  CALCIUM 8.0* 7.3*  --  8.4* 8.3*  MG 2.1  --   --   --   --    Liver Function Tests: Recent Labs  Lab 09/29/20 1600  AST 30  ALT 20  ALKPHOS 62  BILITOT 0.6  PROT 6.5  ALBUMIN 3.3*   No results for input(s): LIPASE, AMYLASE in the last 168 hours. No results for input(s): AMMONIA in the last 168 hours. CBC: Recent Labs  Lab 09/29/20 1600 09/30/20 0203 10/02/20 0854  WBC 13.2* 9.8 7.4  HGB 11.5* 10.2* 11.4*  HCT 31.7* 30.8* 30.7*  MCV 93.2 99.4 91.4  PLT 153 140* 232   Cardiac Enzymes: No results for input(s): CKTOTAL, CKMB, CKMBINDEX, TROPONINI in the last 168 hours. BNP: Invalid input(s): POCBNP CBG: No results for input(s): GLUCAP in the last 168 hours. D-Dimer No results for input(s): DDIMER in the last 72 hours. Hgb A1c No results for input(s): HGBA1C in the last 72 hours. Lipid Profile No results for input(s): CHOL, HDL, LDLCALC, TRIG, CHOLHDL, LDLDIRECT in the last 72 hours. Thyroid function studies No results for input(s): TSH, T4TOTAL, T3FREE, THYROIDAB in the last 72 hours.  Invalid  input(s): FREET3 Anemia work up No results for input(s): VITAMINB12, FOLATE, FERRITIN, TIBC, IRON, RETICCTPCT in the last 72 hours. Urinalysis    Component Value Date/Time   COLORURINE YELLOW 09/30/2020 0325   APPEARANCEUR CLEAR 09/30/2020 0325   LABSPEC 1.015 09/30/2020 0325   PHURINE 5.5 09/30/2020 0325   GLUCOSEU NEGATIVE 09/30/2020 0325   HGBUR LARGE (A) 09/30/2020 0325   BILIRUBINUR SMALL (A) 09/30/2020 0325   KETONESUR TRACE (A) 09/30/2020 0325   PROTEINUR 100 (A) 09/30/2020 0325   NITRITE NEGATIVE 09/30/2020 0325   LEUKOCYTESUR SMALL (A) 09/30/2020 0325   Sepsis Labs Invalid input(s): PROCALCITONIN,  WBC,  LACTICIDVEN Microbiology Recent Results (from the past 240 hour(s))  Resp Panel by RT-PCR (Flu A&B, Covid) Nasopharyngeal Swab     Status: None   Collection Time: 09/29/20  9:44 PM   Specimen: Nasopharyngeal Swab; Nasopharyngeal(NP) swabs in vial transport medium  Result Value Ref Range Status   SARS Coronavirus 2 by RT PCR NEGATIVE NEGATIVE Final  Comment: (NOTE) SARS-CoV-2 target nucleic acids are NOT DETECTED.  The SARS-CoV-2 RNA is generally detectable in upper respiratory specimens during the acute phase of infection. The lowest concentration of SARS-CoV-2 viral copies this assay can detect is 138 copies/mL. A negative result does not preclude SARS-Cov-2 infection and should not be used as the sole basis for treatment or other patient management decisions. A negative result may occur with  improper specimen collection/handling, submission of specimen other than nasopharyngeal swab, presence of viral mutation(s) within the areas targeted by this assay, and inadequate number of viral copies(<138 copies/mL). A negative result must be combined with clinical observations, patient history, and epidemiological information. The expected result is Negative.  Fact Sheet for Patients:  BloggerCourse.com  Fact Sheet for Healthcare Providers:   SeriousBroker.it  This test is no t yet approved or cleared by the [place] FDA and  has been authorized for detection and/or diagnosis of SARS-CoV-2 by FDA under an Emergency Use Authorization (EUA). This EUA will remain  in effect (meaning this test can be used) for the duration of the COVID-19 declaration under Section 564(b)(1) of the Act, 21 U.S.C.section 360bbb-3(b)(1), unless the authorization is terminated  or revoked sooner.       Influenza A by PCR NEGATIVE NEGATIVE Final   Influenza B by PCR NEGATIVE NEGATIVE Final    Comment: (NOTE) The Xpert Xpress SARS-CoV-2/FLU/RSV plus assay is intended as an aid in the diagnosis of influenza from Nasopharyngeal swab specimens and should not be used as a sole basis for treatment. Nasal washings and aspirates are unacceptable for Xpert Xpress SARS-CoV-2/FLU/RSV testing.  Fact Sheet for Patients: BloggerCourse.com  Fact Sheet for Healthcare Providers: SeriousBroker.it  This test is not yet approved or cleared by the [place] FDA and has been authorized for detection and/or diagnosis of SARS-CoV-2 by FDA under an Emergency Use Authorization (EUA). This EUA will remain in effect (meaning this test can be used) for the duration of the COVID-19 declaration under Section 564(b)(1) of the Act, 21 U.S.C. section 360bbb-3(b)(1), unless the authorization is terminated or revoked.  Performed at [facility], [address] [place], [place] [address]      Time coordinating discharge: 35 minutes    30 Day Unplanned Readmission Risk Score    Flowsheet Row ED to Hosp-Admission (Current) from 09/29/2020 in [facility] (1C)  30 Day Unplanned Readmission Risk Score (%) 34.05 Filed at 10/02/2020 0801       This score is the patient's risk of an unplanned readmission within 30 days of being discharged (0 -100%).  The score is based on dignosis, age, lab data, medications, orders, and past utilization.   Low:  0-14.9   Medium: 15-21.9   High: 22-29.9   Extreme: 30 and above            SIGNED:   Alberteen Sam, MD  [facility] 10/02/2020, 12:10 PM

## 2020-10-02 NOTE — Progress Notes (Signed)
Patient slept overnight from around 11pm to 6 am. In AM had a large bowel movement. Sitter alerted nurse to plastic glove in stool.

## 2020-10-02 NOTE — Care Management Important Message (Signed)
Important Message  Patient Details  Name: FELIBERTO STOCKLEY MRN: [id number] Date of Birth: [birth year]   Medicare Important Message Given:  N/A - LOS <3 / Initial given by admissions     Olegario Messier A Carsten Carstarphen 10/02/2020, 10:35 AM

## 2020-10-02 NOTE — Progress Notes (Signed)
This morning transport came around to take patient to xray. Patient refused to get into bed, kept going back to bedside recliner. Notified Dr. Maryfrances Bunnell regarding issue. Had it scheduled for a later time, however patient refused to go for second trip.

## 2020-10-02 NOTE — NC FL2 (Signed)
[place] MEDICAID FL2 LEVEL OF CARE SCREENING TOOL     IDENTIFICATION  Patient Name: Ryan Mayer Birthdate: August 10, 1955 Sex: male Admission Date (Current Location): 09/29/2020  [facility] and [place] Number:  Chiropodist and Address:  [facility], [address], [place], [place] [address]      Provider Number: [id number]  Attending Physician Name and Address:  Alberteen Sam, *  Relative Name and Phone Number:       Current Level of Care: Hospital Recommended Level of Care: Skilled Nursing Facility Prior Approval Number:    Date Approved/Denied:   PASRR Number:    Discharge Plan: Other (Comment) (Group home)    Current Diagnoses: Patient Active Problem List   Diagnosis Date Noted   Hyponatremia 09/30/2020   Epilepsy (HCC) 09/30/2020   Malnutrition of moderate degree 10/02/2019   Blood in stool, frank    Diarrhea    Colitis with rectal bleeding 09/30/2019   UTI (urinary tract infection) 09/30/2019   AKI (acute kidney injury) (HCC) 09/30/2019   Intellectual disability 09/30/2019   Pica 09/30/2019   Foreign body ingestion 09/30/2019   Hypotension 09/30/2019   HTN (hypertension), benign 09/07/2013   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) 09/07/2013    Orientation RESPIRATION BLADDER Height & Weight      (IDD)  Normal Incontinent Weight:   Height:     BEHAVIORAL SYMPTOMS/MOOD NEUROLOGICAL BOWEL NUTRITION STATUS  Wanderer (had sitter while in acute care, patient has PICA)   Incontinent Diet (heart healthy)  AMBULATORY STATUS COMMUNICATION OF NEEDS Skin   Supervision Verbally Normal, Bruising                       Personal Care Assistance Level of Assistance  Bathing, Feeding, Dressing Bathing Assistance: Maximum assistance Feeding assistance: Maximum assistance Dressing Assistance: Maximum assistance     Functional Limitations Info  Sight, Hearing, Speech Sight Info:  Adequate Hearing Info: Adequate Speech Info: Adequate    SPECIAL CARE FACTORS FREQUENCY                       Contractures Contractures Info: Not present    Additional Factors Info  Code Status, Allergies Code Status Info: FULL Allergies Info: No known allergies           Current Medications (10/02/2020):  This is the current hospital active medication list Current Facility-Administered Medications  Medication Dose Route Frequency Provider Last Rate Last Admin   0.9 %  sodium chloride infusion   Intravenous Continuous Ward, Kristen N, DO   Stopped at 10/01/20 0200   acetaminophen (TYLENOL) tablet 650 mg  650 mg Oral Q6H PRN Andris Baumann, MD       Or   acetaminophen (TYLENOL) suppository 650 mg  650 mg Rectal Q6H PRN Andris Baumann, MD       carbamazepine (TEGRETOL) chewable tablet 200 mg  200 mg Oral TID Clapacs, John T, MD   200 mg at 10/02/20 0816   cephALEXin (KEFLEX) capsule 500 mg  500 mg Oral BID Manuela Schwartz, NP   500 mg at 10/02/20 1007   clonazePAM (KLONOPIN) disintegrating tablet 0.5 mg  0.5 mg Oral TID Clapacs, John T, MD   0.5 mg at 10/02/20 0815   enoxaparin (LOVENOX) injection 30 mg  30 mg Subcutaneous Q24H Lindajo Royal V, MD   30 mg at 10/02/20 0817   escitalopram (LEXAPRO) tablet 10 mg  10 mg Oral Daily Delfino Lovett, MD   10 mg at 10/02/20 0816   haloperidol (HALDOL) tablet 1 mg  1 mg Oral Q6H PRN Delfino Lovett, MD       Or   haloperidol lactate (HALDOL) injection 1 mg  1 mg Intramuscular Q6H PRN Delfino Lovett, MD   1 mg at 10/01/20 0918   hydrOXYzine (ATARAX/VISTARIL) tablet 50 mg  50 mg Oral TID PRN Delfino Lovett, MD   50 mg at 10/02/20 0816   LORazepam (ATIVAN) tablet 1 mg  1 mg Oral Daily PRN Delfino Lovett, MD   1 mg at 10/02/20 0816   LORazepam (ATIVAN) tablet 1 mg  1 mg Oral BID Delfino Lovett, MD   1 mg at 10/01/20 2121   ondansetron (ZOFRAN) tablet 4 mg  4 mg Oral Q6H PRN Andris Baumann, MD       Or   ondansetron [facility]) injection 4 mg  4 mg  Intravenous Q6H PRN Andris Baumann, MD       pantoprazole (PROTONIX) EC tablet 40 mg  40 mg Oral Daily Delfino Lovett, MD   40 mg at 10/02/20 0815   risperiDONE (RISPERDAL) tablet 1 mg  1 mg Oral TID Delfino Lovett, MD   1 mg at 10/02/20 0816   traZODone (DESYREL) tablet 125 mg  125 mg Oral QHS Delfino Lovett, MD   125 mg at 10/01/20 2121   traZODone (DESYREL) tablet 50 mg  50 mg Oral BID Delfino Lovett, MD   50 mg at 10/02/20 5409     Discharge Medications: Please see discharge summary for a list of discharge medications.  Relevant Imaging Results:  Relevant Lab Results:   Additional Information [id number]  Caryn Section, RN

## 2020-10-02 NOTE — TOC Progression Note (Signed)
[facility] - Pro[facility]    Patient Details  Name: Ryan Mayer MRN: [id number] Date of Birth: 07/27/1955  [facility]) CM/SW Contact  Caryn Section, RN Phone Number: 10/02/2020, 2:53 PM  Clinical Narrative:   [name].  Nurse Efraim Kaufmann accepts patient back to facility where he is a resident.         Expected Discharge Plan and Services           Expected Discharge Date: 10/02/20                                     Social Determinants of Health (SDOH) Interventions    Readmission Risk Interventions No flowsheet data found.

## 2020-12-08 ENCOUNTER — Other Ambulatory Visit: Payer: Self-pay

## 2020-12-08 ENCOUNTER — Inpatient Hospital Stay
Admission: EM | Admit: 2020-12-08 | Discharge: 2021-01-10 | DRG: 643 | Disposition: E | Payer: Medicare Other | Attending: Internal Medicine | Admitting: Internal Medicine

## 2020-12-08 DIAGNOSIS — R339 Retention of urine, unspecified: Secondary | ICD-10-CM | POA: Diagnosis not present

## 2020-12-08 DIAGNOSIS — J9601 Acute respiratory failure with hypoxia: Secondary | ICD-10-CM

## 2020-12-08 DIAGNOSIS — Z1619 Resistance to other specified beta lactam antibiotics: Secondary | ICD-10-CM | POA: Diagnosis present

## 2020-12-08 DIAGNOSIS — Y92239 Unspecified place in hospital as the place of occurrence of the external cause: Secondary | ICD-10-CM | POA: Diagnosis not present

## 2020-12-08 DIAGNOSIS — R451 Restlessness and agitation: Secondary | ICD-10-CM

## 2020-12-08 DIAGNOSIS — R54 Age-related physical debility: Secondary | ICD-10-CM | POA: Diagnosis present

## 2020-12-08 DIAGNOSIS — I952 Hypotension due to drugs: Secondary | ICD-10-CM | POA: Diagnosis not present

## 2020-12-08 DIAGNOSIS — Z79899 Other long term (current) drug therapy: Secondary | ICD-10-CM

## 2020-12-08 DIAGNOSIS — K5909 Other constipation: Secondary | ICD-10-CM | POA: Diagnosis present

## 2020-12-08 DIAGNOSIS — J9602 Acute respiratory failure with hypercapnia: Secondary | ICD-10-CM | POA: Diagnosis not present

## 2020-12-08 DIAGNOSIS — Z4659 Encounter for fitting and adjustment of other gastrointestinal appliance and device: Secondary | ICD-10-CM

## 2020-12-08 DIAGNOSIS — R040 Epistaxis: Secondary | ICD-10-CM | POA: Diagnosis not present

## 2020-12-08 DIAGNOSIS — E860 Dehydration: Secondary | ICD-10-CM | POA: Diagnosis present

## 2020-12-08 DIAGNOSIS — Z515 Encounter for palliative care: Secondary | ICD-10-CM

## 2020-12-08 DIAGNOSIS — I110 Hypertensive heart disease with heart failure: Secondary | ICD-10-CM | POA: Diagnosis present

## 2020-12-08 DIAGNOSIS — E669 Obesity, unspecified: Secondary | ICD-10-CM | POA: Diagnosis present

## 2020-12-08 DIAGNOSIS — G40409 Other generalized epilepsy and epileptic syndromes, not intractable, without status epilepticus: Secondary | ICD-10-CM | POA: Diagnosis present

## 2020-12-08 DIAGNOSIS — G40909 Epilepsy, unspecified, not intractable, without status epilepticus: Secondary | ICD-10-CM

## 2020-12-08 DIAGNOSIS — I248 Other forms of acute ischemic heart disease: Secondary | ICD-10-CM | POA: Diagnosis not present

## 2020-12-08 DIAGNOSIS — Z01818 Encounter for other preprocedural examination: Secondary | ICD-10-CM

## 2020-12-08 DIAGNOSIS — E222 Syndrome of inappropriate secretion of antidiuretic hormone: Principal | ICD-10-CM | POA: Diagnosis present

## 2020-12-08 DIAGNOSIS — R6250 Unspecified lack of expected normal physiological development in childhood: Secondary | ICD-10-CM | POA: Diagnosis present

## 2020-12-08 DIAGNOSIS — T4275XA Adverse effect of unspecified antiepileptic and sedative-hypnotic drugs, initial encounter: Secondary | ICD-10-CM | POA: Diagnosis not present

## 2020-12-08 DIAGNOSIS — K5900 Constipation, unspecified: Secondary | ICD-10-CM

## 2020-12-08 DIAGNOSIS — R338 Other retention of urine: Secondary | ICD-10-CM

## 2020-12-08 DIAGNOSIS — E876 Hypokalemia: Secondary | ICD-10-CM

## 2020-12-08 DIAGNOSIS — J156 Pneumonia due to other aerobic Gram-negative bacteria: Secondary | ICD-10-CM | POA: Diagnosis not present

## 2020-12-08 DIAGNOSIS — F72 Severe intellectual disabilities: Secondary | ICD-10-CM | POA: Diagnosis present

## 2020-12-08 DIAGNOSIS — F19239 Other psychoactive substance dependence with withdrawal, unspecified: Secondary | ICD-10-CM | POA: Diagnosis not present

## 2020-12-08 DIAGNOSIS — J690 Pneumonitis due to inhalation of food and vomit: Secondary | ICD-10-CM | POA: Diagnosis not present

## 2020-12-08 DIAGNOSIS — E871 Hypo-osmolality and hyponatremia: Secondary | ICD-10-CM | POA: Diagnosis not present

## 2020-12-08 DIAGNOSIS — J9811 Atelectasis: Secondary | ICD-10-CM | POA: Diagnosis not present

## 2020-12-08 DIAGNOSIS — Z7982 Long term (current) use of aspirin: Secondary | ICD-10-CM

## 2020-12-08 DIAGNOSIS — R571 Hypovolemic shock: Secondary | ICD-10-CM | POA: Diagnosis not present

## 2020-12-08 DIAGNOSIS — Z20822 Contact with and (suspected) exposure to covid-19: Secondary | ICD-10-CM | POA: Diagnosis present

## 2020-12-08 DIAGNOSIS — Z66 Do not resuscitate: Secondary | ICD-10-CM | POA: Diagnosis not present

## 2020-12-08 DIAGNOSIS — F79 Unspecified intellectual disabilities: Secondary | ICD-10-CM

## 2020-12-08 DIAGNOSIS — R0902 Hypoxemia: Secondary | ICD-10-CM

## 2020-12-08 DIAGNOSIS — Z0189 Encounter for other specified special examinations: Secondary | ICD-10-CM

## 2020-12-08 DIAGNOSIS — G928 Other toxic encephalopathy: Secondary | ICD-10-CM | POA: Diagnosis not present

## 2020-12-08 DIAGNOSIS — I160 Hypertensive urgency: Secondary | ICD-10-CM

## 2020-12-08 DIAGNOSIS — I5030 Unspecified diastolic (congestive) heart failure: Secondary | ICD-10-CM | POA: Diagnosis present

## 2020-12-08 DIAGNOSIS — Z6830 Body mass index (BMI) 30.0-30.9, adult: Secondary | ICD-10-CM

## 2020-12-08 LAB — COMPREHENSIVE METABOLIC PANEL
ALT: 15 U/L (ref 0–44)
AST: 16 U/L (ref 15–41)
Albumin: 4 g/dL (ref 3.5–5.0)
Alkaline Phosphatase: 84 U/L (ref 38–126)
Anion gap: 7 (ref 5–15)
BUN: 14 mg/dL (ref 8–23)
CO2: 24 mmol/L (ref 22–32)
Calcium: 8.7 mg/dL — ABNORMAL LOW (ref 8.9–10.3)
Chloride: 91 mmol/L — ABNORMAL LOW (ref 98–111)
Creatinine, Ser: 0.78 mg/dL (ref 0.61–1.24)
GFR, Estimated: >60 mL/min (ref >60)
Glucose, Bld: 103 mg/dL — ABNORMAL HIGH (ref 70–99)
Potassium: 3.3 mmol/L — ABNORMAL LOW (ref 3.5–5.1)
Sodium: 122 mmol/L — ABNORMAL LOW (ref 135–145)
Total Bilirubin: 0.7 mg/dL (ref 0.3–1.2)
Total Protein: 7.4 g/dL (ref 6.5–8.1)

## 2020-12-08 LAB — CBC WITH DIFFERENTIAL/PLATELET
Abs Immature Granulocytes: 0.02 10*3/uL (ref 0.00–0.07)
Basophils Absolute: 0.1 10*3/uL (ref 0.0–0.1)
Basophils Relative: 1 %
Eosinophils Absolute: 0.2 10*3/uL (ref 0.0–0.5)
Eosinophils Relative: 4 %
HCT: 35.8 % — ABNORMAL LOW (ref 39.0–52.0)
Hemoglobin: 13.3 g/dL (ref 13.0–17.0)
Immature Granulocytes: 0 %
Lymphocytes Relative: 24 %
Lymphs Abs: 1.4 10*3/uL (ref 0.7–4.0)
MCH: 32.4 pg (ref 26.0–34.0)
MCHC: 37.2 g/dL — ABNORMAL HIGH (ref 30.0–36.0)
MCV: 87.3 fL (ref 80.0–100.0)
Monocytes Absolute: 0.6 10*3/uL (ref 0.1–1.0)
Monocytes Relative: 11 %
Neutro Abs: 3.3 10*3/uL (ref 1.7–7.7)
Neutrophils Relative %: 60 %
Platelets: 230 10*3/uL (ref 150–400)
RBC: 4.1 MIL/uL — ABNORMAL LOW (ref 4.22–5.81)
RDW: 11.5 % (ref 11.5–15.5)
WBC: 5.6 10*3/uL (ref 4.0–10.5)
nRBC: 0 % (ref 0.0–0.2)

## 2020-12-08 MED ORDER — LORAZEPAM 2 MG/ML IJ SOLN
2.0000 mg | Freq: Once | INTRAMUSCULAR | Status: AC
  Administered 2020-12-09: 2 mg via INTRAMUSCULAR
  Filled 2020-12-08: qty 1

## 2020-12-08 MED ORDER — LACTATED RINGERS IV BOLUS
1000.0000 mL | Freq: Once | INTRAVENOUS | Status: AC
  Administered 2020-12-09: 1000 mL via INTRAVENOUS

## 2020-12-08 MED ORDER — DROPERIDOL 2.5 MG/ML IJ SOLN
5.0000 mg | Freq: Once | INTRAMUSCULAR | Status: AC
  Administered 2020-12-09: 5 mg via INTRAMUSCULAR
  Filled 2020-12-08: qty 2

## 2020-12-08 NOTE — ED Provider Notes (Signed)
[facility] Emergency Department Provider Note   ____________________________________________   Event Date/Time   First MD Initiated Contact with Patient 11/12/2020 2341     (approximate)  I have reviewed the triage vital signs and the nursing notes.   HISTORY  Chief Complaint Hypertension    HPI Ryan Mayer is a 65 y.o. male with past medical history of hypertension, seizures, and intellectual disability presents to the ED for elevated blood pressure.  History is limited due to patient's intellectual disability and majority of history is obtained from patient's caregiver at bedside.  He states that the patient's blood pressure has been running high recently despite taking his usual blood pressure medications.  He is also concerned that patient has been acting more erratically than usual, more aggressive at times and has not been sleeping.  Patient currently denies any complaints, is not sure why he is here.  Patient's blood pressure medications were adjusted in September due to AKI, caregiver is not sure what medications patient is currently taking for his blood pressure.        Past Medical History:  Diagnosis Date   Hypertension    Intellectual disability    Seizure [facility])     Patient Active Problem List   Diagnosis Date Noted   Hyponatremia 09/30/2020   Epilepsy (HCC) 09/30/2020   Malnutrition of moderate degree 10/02/2019   Blood in stool, frank    Diarrhea    Colitis with rectal bleeding 09/30/2019   UTI (urinary tract infection) 09/30/2019   AKI (acute kidney injury) (HCC) 09/30/2019   Intellectual disability 09/30/2019   Pica 09/30/2019   Foreign body ingestion 09/30/2019   Hypotension 09/30/2019   HTN (hypertension), benign 09/07/2013   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 No pertinent surgical history.  Prior to Admission medications   Medication Sig Start Date  End Date Taking? Authorizing Provider  acetaminophen (TYLENOL) 325 MG tablet Take 650 mg by mouth every 4 (four) hours as needed for fever.    [provider]  amLODipine (NORVASC) 5 MG tablet Take 5 mg by mouth daily. 09/10/19   [provider]  aspirin EC 81 MG tablet Take 81 mg by mouth daily. Swallow whole.    [provider]  atropine 1 % ophthalmic solution Place 1 drop into the left eye 6 (six) times daily.    [provider]  bisacodyl (DULCOLAX) 5 MG EC tablet Take 10 mg by mouth every 12 (twelve) hours as needed for severe constipation.    [provider]  brimonidine (ALPHAGAN) 0.2 % ophthalmic solution Place 1 drop into the left eye 3 (three) times daily.    [provider]  camphor-menthol Wynelle Fanny) lotion Apply 1 application topically as needed for itching.    [provider]  carbamazepine (TEGRETOL) 200 MG tablet Take 200 mg by mouth 3 (three) times daily. 09/10/19   [provider]  carbamide peroxide (DEBROX) 6.5 % OTIC solution Place 5-10 drops into both ears every Friday.    [provider]  carvedilol (COREG) 25 MG tablet Take 25 mg by mouth 2 (two) times daily with a meal.    [provider]  cetirizine (ZYRTEC) 10 MG tablet Take 10 mg by mouth 2 (two) times daily.    [provider]  chlorhexidine (PERIDEX) 0.12 % solution Use as directed 5 mLs in the mouth or throat 2 (two) times daily.  [provider]  diphenhydrAMINE (BENADRYL) 25 mg capsule Take 25 mg by mouth every 6 (six) hours as needed.    [provider]  docusate sodium (COLACE) 100 MG capsule Take 100 mg by mouth 3 (three) times daily as needed for mild constipation or moderate constipation.    [provider]  dorzolamide-timolol (COSOPT) 22.3-6.8 MG/ML ophthalmic solution Place 1 drop into the left eye 2 (two) times daily.    [provider]  Emollient (CETAPHIL) cream Apply 1  application topically as directed. Apply after bath    [provider]  escitalopram (LEXAPRO) 10 MG tablet Take 10 mg by mouth daily. 09/10/19   [provider]  fesoterodine (TOVIAZ) 8 MG TB24 tablet Take 8 mg by mouth daily.    [provider]  fluticasone (FLONASE) 50 MCG/ACT nasal spray Place 1 spray into both nostrils 2 (two) times daily. 09/19/19   [provider]  guaiFENesin (ROBITUSSIN) 100 MG/5ML SOLN Take 5 mLs by mouth every 4 (four) hours as needed for cough.    [provider]  hydrOXYzine (ATARAX/VISTARIL) 50 MG tablet Take 50 mg by mouth 3 (three) times daily as needed for itching or anxiety.    [provider]  latanoprost (XALATAN) 0.005 % ophthalmic solution Place 1 drop into the left eye at bedtime.    [provider]  levocetirizine (XYZAL) 5 MG tablet Take 5 mg by mouth at bedtime.  09/10/19   [provider]  loperamide (IMODIUM) 2 MG capsule Take 2-4 mg by mouth See admin instructions. Take 2 capsules (4mg ) by mouth after first loose stool then take 1 capsule (2mg ) by mouth after each additional loose stool (PRN)    [provider]  LORazepam (ATIVAN) 1 MG tablet Take 1 mg by mouth daily as needed for anxiety or sedation.    [provider]  LORazepam (ATIVAN) 1 MG tablet Take 1 mg by mouth in the morning and at bedtime. (1700 & 2000)    [provider]  metroNIDAZOLE (METROCREAM) 0.75 % cream Apply 1 application topically at bedtime. (Apply to face)    [provider]  naltrexone (DEPADE) 50 MG tablet Take 50 mg by mouth daily.    [provider]  neomycin-bacitracin-polymyxin (NEOSPORIN) ointment Apply 1 application topically daily as needed for wound care.    [provider]  pantoprazole (PROTONIX) 40 MG tablet Take 40 mg by mouth daily. 09/10/19   [provider]  polyethylene glycol (MIRALAX / GLYCOLAX) 17 g packet Take 17 g by mouth daily.     [provider]  potassium chloride SA (KLOR-CON) 20 MEQ tablet Take 1 tablet (20 mEq total) by mouth daily. 10/02/20   Danford, Suann Larry, MD  prednisoLONE acetate (PRED FORTE) 1 % ophthalmic suspension Place 1 drop into the left eye 6 (six) times daily.    [provider]  risperiDONE (RISPERDAL) 0.5 MG tablet Take 1 mg by mouth See admin instructions. Take 2 tablets (1 mg) by mouth every morning and 2 tablets (1 mg) at bedtime    [provider]  tolnaftate (TINACTIN) 1 % cream Apply 1 application topically 2 (two) times daily as needed (itchy feet).    [provider]  traZODone (DESYREL) 50 MG tablet Take 50-125 mg by mouth 3 (three) times daily. Take one 50 mg tablet by mouth every morning, one 50 mg tablet at 2 PM, and two and one-half tablets 125 mg at bedtime    [provider]    Allergies Patient has no known allergies.  History reviewed. No pertinent family history.  Social History Social History   Tobacco Use   Smoking status: Never   Smokeless tobacco: Never  Substance Use Topics   Alcohol use: Never   Drug use: Never    Review of Systems Unable to obtain secondary to intellectual disability  ____________________________________________   PHYSICAL EXAM:  VITAL SIGNS: ED Triage Vitals [11/28/2020 1756]  Enc Vitals Group     BP (!) 165/115     Pulse Rate 88     Resp 16     Temp 98.9 F (37.2 C)     Temp Source Oral     SpO2 100 %     Weight 154 lb 5.2 oz (70 kg)     Height 5' (1.524 m)     Head Circumference      Peak Flow      Pain Score      Pain Loc      Pain Edu?      Excl. in [place]?     Constitutional: Alert and oriented to person, but not place or time. Eyes: Conjunctivae are normal. Head: Atraumatic. Nose: No congestion/rhinnorhea. Mouth/Throat: Mucous membranes are moist. Neck: Normal ROM Cardiovascular: Normal rate, regular rhythm. Grossly normal heart sounds.  2+ radial pulses  bilaterally. Respiratory: Normal respiratory effort.  No retractions. Lungs CTAB. Gastrointestinal: Soft and nontender. No distention. Genitourinary: deferred Musculoskeletal: No lower extremity tenderness nor edema. Neurologic:  Normal speech and language. No gross focal neurologic deficits are appreciated. Skin:  Skin is warm, dry and intact. No rash noted. Psychiatric: Unable to assess.  ____________________________________________   LABS (all labs ordered are listed, but only abnormal results are displayed)  Labs Reviewed  CBC WITH DIFFERENTIAL/PLATELET - Abnormal; Notable for the following components:      Result Value   RBC 4.10 (*)    HCT 35.8 (*)    MCHC 37.2 (*)    All other components within normal limits  COMPREHENSIVE METABOLIC PANEL - Abnormal; Notable for the following components:   Sodium 122 (*)    Potassium 3.3 (*)    Chloride 91 (*)    Glucose, Bld 103 (*)    Calcium 8.7 (*)    All other components within normal limits  BASIC METABOLIC PANEL - Abnormal; Notable for the following components:   Sodium 121 (*)    Potassium 3.2 (*)    Chloride 93 (*)    CO2 21 (*)    Glucose, Bld 143 (*)    Calcium 8.5 (*)    All other components within normal limits  RESP PANEL BY RT-PCR (FLU A&B, COVID) ARPGX2  URINALYSIS, COMPLETE (UACMP) WITH MICROSCOPIC  MAGNESIUM    PROCEDURES  Procedure(s) performed (including Critical Care):  Procedures   ____________________________________________   INITIAL IMPRESSION / ASSESSMENT AND PLAN / ED COURSE      65 year old male with past medical history of hypertension, seizures, and intellectual disability who presents to the ED due to concern for elevated blood pressure despite being compliant with his usual blood pressure medications.  It appears patient was recently restarted on lisinopril and HCTZ for his blood pressure, but blood pressure has remained high since then.  There is also concern that his behavior has been  more erratic recently.  He appears at his baseline mental status with no focal neurologic deficits, blood pressure remains elevated but no evidence of hypertensive emergency at this time.  Labs  are remarkable for hyponatremia at 122, likely related to his recently restarted HCTZ.  We will hydrate with IV fluids but given significant hyponatremia and ongoing issues with blood pressure, patient would benefit from admission.  Unfortunately, he becomes very agitated and aggressive with any IV attempts and will require premedication with IM calming medicines to facilitate IV placement and ensure safety of staff.  IV was placed following dose of droperidol and Ativan and patient given 1 L of IV fluids, unfortunately he remains hyponatremic at 121 following IV fluids.  He also remains agitated and attempting to take out his IV, was medicated with 50 mg of Benadryl with no improvement.  We will give 0.5 mg/kg dose of ketamine to ensure safety of patient and staff and facilitate appropriate medical treatment.  Case discussed with hospitalist for admission.      ____________________________________________   FINAL CLINICAL IMPRESSION(S) / ED DIAGNOSES  Final diagnoses:  Hyponatremia  Hypertensive urgency     ED Discharge Orders     None        Note:  This document was prepared using Dragon voice recognition software and may include unintentional dictation errors.    Blake Divine, MD 12/09/20 [phone]

## 2020-12-08 NOTE — ED Triage Notes (Signed)
Pt to ED with group home staff from [facility]. Reports here for HTN.  Staff member with pt does not typically work there, poor historian.  Pt unable to answer questions NAD noted

## 2020-12-08 NOTE — ED Provider Notes (Signed)
Emergency Medicine Provider Triage Evaluation Note  Ryan Mayer , a 65 y.o. male  was evaluated in triage.  Pt complains of hypertension without other complaints.  Patient is unable to provide historical information.  Patient is accompanied by group home representative who does not typically work with patient.  Review of Systems  Positive: Patient has hypertension.  Negative: No chest pain, chest tightness or abdominal pain.   Physical Exam  BP (!) 165/115   Pulse 88   Temp 98.9 F (37.2 C) (Oral)   Resp 16   Ht 5' (1.524 m)   Wt 70 kg   SpO2 100%   BMI 30.14 kg/m  Gen:   Awake, no distress   Resp:  Normal effort  MSK:   Moves extremities without difficulty  Other:    Medical Decision Making  Medically screening exam initiated at 6:03 PM.  Appropriate orders placed.  Ryan Mayer was informed that the remainder of the evaluation will be completed by another provider, this initial triage assessment does not replace that evaluation, and the importance of remaining in the ED until their evaluation is complete.     Pia Mau [facility], PA-C 12/25/2020 1804    [place], MD 25-Dec-2020 [phone]

## 2020-12-08 NOTE — ED Notes (Signed)
Patient unwilling to have vital signs repeated; caregiver unable to encourage patient to have blood pressure or heart/ O2 Sat obtained; skin warm/ dry; respirations even non-labored; non-verbal/ incomprehensible at baseline

## 2020-12-09 ENCOUNTER — Inpatient Hospital Stay: Payer: Medicare Other

## 2020-12-09 DIAGNOSIS — I110 Hypertensive heart disease with heart failure: Secondary | ICD-10-CM | POA: Diagnosis present

## 2020-12-09 DIAGNOSIS — I160 Hypertensive urgency: Secondary | ICD-10-CM

## 2020-12-09 DIAGNOSIS — J156 Pneumonia due to other aerobic Gram-negative bacteria: Secondary | ICD-10-CM | POA: Diagnosis not present

## 2020-12-09 DIAGNOSIS — J9811 Atelectasis: Secondary | ICD-10-CM | POA: Diagnosis not present

## 2020-12-09 DIAGNOSIS — I214 Non-ST elevation (NSTEMI) myocardial infarction: Secondary | ICD-10-CM

## 2020-12-09 DIAGNOSIS — F79 Unspecified intellectual disabilities: Secondary | ICD-10-CM | POA: Diagnosis not present

## 2020-12-09 DIAGNOSIS — Z515 Encounter for palliative care: Secondary | ICD-10-CM | POA: Diagnosis not present

## 2020-12-09 DIAGNOSIS — Z66 Do not resuscitate: Secondary | ICD-10-CM | POA: Diagnosis not present

## 2020-12-09 DIAGNOSIS — K5900 Constipation, unspecified: Secondary | ICD-10-CM

## 2020-12-09 DIAGNOSIS — K5909 Other constipation: Secondary | ICD-10-CM | POA: Diagnosis present

## 2020-12-09 DIAGNOSIS — R451 Restlessness and agitation: Secondary | ICD-10-CM | POA: Diagnosis not present

## 2020-12-09 DIAGNOSIS — Z7189 Other specified counseling: Secondary | ICD-10-CM

## 2020-12-09 DIAGNOSIS — R338 Other retention of urine: Secondary | ICD-10-CM

## 2020-12-09 DIAGNOSIS — Z1619 Resistance to other specified beta lactam antibiotics: Secondary | ICD-10-CM | POA: Diagnosis present

## 2020-12-09 DIAGNOSIS — F19239 Other psychoactive substance dependence with withdrawal, unspecified: Secondary | ICD-10-CM | POA: Diagnosis not present

## 2020-12-09 DIAGNOSIS — G40409 Other generalized epilepsy and epileptic syndromes, not intractable, without status epilepticus: Secondary | ICD-10-CM | POA: Diagnosis present

## 2020-12-09 DIAGNOSIS — Z7982 Long term (current) use of aspirin: Secondary | ICD-10-CM | POA: Diagnosis not present

## 2020-12-09 DIAGNOSIS — I5030 Unspecified diastolic (congestive) heart failure: Secondary | ICD-10-CM | POA: Diagnosis present

## 2020-12-09 DIAGNOSIS — G40909 Epilepsy, unspecified, not intractable, without status epilepticus: Secondary | ICD-10-CM | POA: Diagnosis not present

## 2020-12-09 DIAGNOSIS — J690 Pneumonitis due to inhalation of food and vomit: Secondary | ICD-10-CM | POA: Diagnosis not present

## 2020-12-09 DIAGNOSIS — G928 Other toxic encephalopathy: Secondary | ICD-10-CM | POA: Diagnosis not present

## 2020-12-09 DIAGNOSIS — E876 Hypokalemia: Secondary | ICD-10-CM | POA: Diagnosis present

## 2020-12-09 DIAGNOSIS — E871 Hypo-osmolality and hyponatremia: Secondary | ICD-10-CM | POA: Diagnosis present

## 2020-12-09 DIAGNOSIS — Y92239 Unspecified place in hospital as the place of occurrence of the external cause: Secondary | ICD-10-CM | POA: Diagnosis not present

## 2020-12-09 DIAGNOSIS — E222 Syndrome of inappropriate secretion of antidiuretic hormone: Secondary | ICD-10-CM | POA: Diagnosis present

## 2020-12-09 DIAGNOSIS — J9602 Acute respiratory failure with hypercapnia: Secondary | ICD-10-CM | POA: Diagnosis not present

## 2020-12-09 DIAGNOSIS — E669 Obesity, unspecified: Secondary | ICD-10-CM | POA: Diagnosis present

## 2020-12-09 DIAGNOSIS — I248 Other forms of acute ischemic heart disease: Secondary | ICD-10-CM | POA: Diagnosis not present

## 2020-12-09 DIAGNOSIS — J9601 Acute respiratory failure with hypoxia: Secondary | ICD-10-CM | POA: Diagnosis not present

## 2020-12-09 DIAGNOSIS — Z20822 Contact with and (suspected) exposure to covid-19: Secondary | ICD-10-CM | POA: Diagnosis present

## 2020-12-09 DIAGNOSIS — F72 Severe intellectual disabilities: Secondary | ICD-10-CM | POA: Diagnosis present

## 2020-12-09 DIAGNOSIS — Z6830 Body mass index (BMI) 30.0-30.9, adult: Secondary | ICD-10-CM | POA: Diagnosis not present

## 2020-12-09 DIAGNOSIS — R571 Hypovolemic shock: Secondary | ICD-10-CM | POA: Diagnosis not present

## 2020-12-09 DIAGNOSIS — J9621 Acute and chronic respiratory failure with hypoxia: Secondary | ICD-10-CM

## 2020-12-09 LAB — BLOOD GAS, ARTERIAL
Acid-base deficit: 1.7 mmol/L (ref 0.0–2.0)
Bicarbonate: 23.7 mmol/L (ref 20.0–28.0)
FIO2: 0.5
MECHVT: 450 mL
Mechanical Rate: 16
O2 Saturation: 99 %
PEEP: 5 cmH2O
Patient temperature: 37
pCO2 arterial: 42 mmHg (ref 32.0–48.0)
pH, Arterial: 7.36 (ref 7.350–7.450)
pO2, Arterial: 136 mmHg — ABNORMAL HIGH (ref 83.0–108.0)

## 2020-12-09 LAB — BASIC METABOLIC PANEL
Anion gap: 7 (ref 5–15)
Anion gap: 7 (ref 5–15)
BUN: 14 mg/dL (ref 8–23)
BUN: 14 mg/dL (ref 8–23)
CO2: 21 mmol/L — ABNORMAL LOW (ref 22–32)
CO2: 18 mmol/L — ABNORMAL LOW (ref 22–32)
Calcium: 8.5 mg/dL — ABNORMAL LOW (ref 8.9–10.3)
Calcium: 7.7 mg/dL — ABNORMAL LOW (ref 8.9–10.3)
Chloride: 93 mmol/L — ABNORMAL LOW (ref 98–111)
Chloride: 97 mmol/L — ABNORMAL LOW (ref 98–111)
Creatinine, Ser: 0.96 mg/dL (ref 0.61–1.24)
Creatinine, Ser: 0.75 mg/dL (ref 0.61–1.24)
GFR, Estimated: >60 mL/min (ref >60)
GFR, Estimated: >60 mL/min (ref >60)
Glucose, Bld: 143 mg/dL — ABNORMAL HIGH (ref 70–99)
Glucose, Bld: 136 mg/dL — ABNORMAL HIGH (ref 70–99)
Potassium: 3.2 mmol/L — ABNORMAL LOW (ref 3.5–5.1)
Potassium: 3 mmol/L — ABNORMAL LOW (ref 3.5–5.1)
Sodium: 121 mmol/L — ABNORMAL LOW (ref 135–145)
Sodium: 122 mmol/L — ABNORMAL LOW (ref 135–145)

## 2020-12-09 LAB — URINALYSIS, COMPLETE (UACMP) WITH MICROSCOPIC
Bacteria, UA: NONE SEEN
Bilirubin Urine: NEGATIVE
Glucose, UA: NEGATIVE mg/dL
Hgb urine dipstick: NEGATIVE
Ketones, ur: NEGATIVE mg/dL
Leukocytes,Ua: NEGATIVE
Nitrite: NEGATIVE
Protein, ur: NEGATIVE mg/dL
RBC / HPF: NONE SEEN RBC/hpf (ref 0–5)
Specific Gravity, Urine: 1.015 (ref 1.005–1.030)
Squamous Epithelial / HPF: NONE SEEN (ref 0–5)
WBC, UA: NONE SEEN WBC/hpf (ref 0–5)
pH: 7 (ref 5.0–8.0)

## 2020-12-09 LAB — CARBAMAZEPINE LEVEL, TOTAL: Carbamazepine Lvl: 2.6 ug/mL — ABNORMAL LOW (ref 4.0–12.0)

## 2020-12-09 LAB — MRSA NEXT GEN BY PCR, NASAL: MRSA by PCR Next Gen: NOT DETECTED

## 2020-12-09 LAB — TROPONIN I (HIGH SENSITIVITY)
Troponin I (High Sensitivity): 1162 ng/L (ref <18)
Troponin I (High Sensitivity): 772 ng/L (ref <18)

## 2020-12-09 LAB — SODIUM, URINE, RANDOM: Sodium, Ur: 65 mmol/L

## 2020-12-09 LAB — OSMOLALITY, URINE: Osmolality, Ur: 482 mOsm/kg (ref 300–900)

## 2020-12-09 LAB — RESP PANEL BY RT-PCR (FLU A&B, COVID) ARPGX2
Influenza A by PCR: NEGATIVE
Influenza B by PCR: NEGATIVE
SARS Coronavirus 2 by RT PCR: NEGATIVE

## 2020-12-09 LAB — SODIUM
Sodium: 122 mmol/L — ABNORMAL LOW (ref 135–145)
Sodium: 123 mmol/L — ABNORMAL LOW (ref 135–145)

## 2020-12-09 LAB — OSMOLALITY: Osmolality: 265 mOsm/kg — ABNORMAL LOW (ref 275–295)

## 2020-12-09 LAB — MAGNESIUM
Magnesium: 1.9 mg/dL (ref 1.7–2.4)
Magnesium: 1.4 mg/dL — ABNORMAL LOW (ref 1.7–2.4)

## 2020-12-09 LAB — CK: Total CK: 462 U/L — ABNORMAL HIGH (ref 49–397)

## 2020-12-09 LAB — BRAIN NATRIURETIC PEPTIDE: B Natriuretic Peptide: 376.7 pg/mL — ABNORMAL HIGH (ref 0.0–100.0)

## 2020-12-09 MED ORDER — RISPERIDONE 1 MG PO TABS: 1.0000 mg | ORAL_TABLET | ORAL | Status: DC

## 2020-12-09 MED ORDER — LORATADINE 10 MG PO TABS
10.0000 mg | ORAL_TABLET | Freq: Every day | ORAL | Status: DC
  Administered 2020-12-09: 10 mg via ORAL
  Filled 2020-12-09: qty 1

## 2020-12-09 MED ORDER — MAGNESIUM SULFATE 4 GM/100ML IV SOLN
4.0000 g | Freq: Once | INTRAVENOUS | Status: AC
  Administered 2020-12-09: 2 g via INTRAVENOUS
  Filled 2020-12-09: qty 100

## 2020-12-09 MED ORDER — BISACODYL 10 MG RE SUPP: 10.0000 mg | Freq: Every day | RECTAL | Status: DC | PRN

## 2020-12-09 MED ORDER — DROPERIDOL 2.5 MG/ML IJ SOLN
INTRAMUSCULAR | Status: AC
  Administered 2020-12-09: 5 mg via INTRAMUSCULAR
  Filled 2020-12-09: qty 2

## 2020-12-09 MED ORDER — CLONIDINE HCL 0.1 MG PO TABS
0.1000 mg | ORAL_TABLET | Freq: Three times a day (TID) | ORAL | Status: DC
  Administered 2020-12-09: 0.1 mg via ORAL
  Filled 2020-12-09: qty 1

## 2020-12-09 MED ORDER — CHLORHEXIDINE GLUCONATE CLOTH 2 % EX PADS
6.0000 | MEDICATED_PAD | Freq: Every day | CUTANEOUS | Status: DC
  Administered 2020-12-09 – 2020-12-17 (×9): 6 via TOPICAL
  Filled 2020-12-09: qty 6

## 2020-12-09 MED ORDER — METOPROLOL TARTRATE 5 MG/5ML IV SOLN: 2.5000 mg | Freq: Four times a day (QID) | INTRAVENOUS | Status: DC

## 2020-12-09 MED ORDER — FENTANYL CITRATE PF 50 MCG/ML IJ SOSY
50.0000 ug | PREFILLED_SYRINGE | Freq: Once | INTRAMUSCULAR | Status: AC
  Administered 2020-12-09: 50 ug via INTRAVENOUS

## 2020-12-09 MED ORDER — FENTANYL CITRATE (PF) 100 MCG/2ML IJ SOLN: 200.0000 ug | Freq: Once | INTRAMUSCULAR | Status: AC

## 2020-12-09 MED ORDER — ZIPRASIDONE MESYLATE 20 MG IM SOLR: 20.0000 mg | INTRAMUSCULAR | Status: DC | PRN

## 2020-12-09 MED ORDER — ASPIRIN 81 MG PO CHEW: 324.0000 mg | CHEWABLE_TABLET | Freq: Once | ORAL | Status: DC

## 2020-12-09 MED ORDER — NOREPINEPHRINE 4 MG/250ML-% IV SOLN
2.0000 ug/min | INTRAVENOUS | Status: DC
  Administered 2020-12-09: 2 ug/min via INTRAVENOUS
  Administered 2020-12-10: 5 ug/min via INTRAVENOUS
  Administered 2020-12-10: 6 ug/min via INTRAVENOUS
  Administered 2020-12-11: 8 ug/min via INTRAVENOUS
  Administered 2020-12-12: 7 ug/min via INTRAVENOUS
  Filled 2020-12-09 (×6): qty 250

## 2020-12-09 MED ORDER — BRIMONIDINE TARTRATE 0.2 % OP SOLN
1.0000 [drp] | Freq: Three times a day (TID) | OPHTHALMIC | Status: DC
  Administered 2020-12-09 – 2020-12-16 (×22): 1 [drp] via OPHTHALMIC
  Filled 2020-12-09: qty 5

## 2020-12-09 MED ORDER — MIDAZOLAM HCL 2 MG/2ML IJ SOLN
INTRAMUSCULAR | Status: AC
  Administered 2020-12-09: 4 mg via INTRAVENOUS
  Filled 2020-12-09: qty 4

## 2020-12-09 MED ORDER — BISACODYL 10 MG RE SUPP
10.0000 mg | Freq: Every day | RECTAL | Status: AC
  Filled 2020-12-09: qty 1

## 2020-12-09 MED ORDER — MIDAZOLAM HCL 2 MG/2ML IJ SOLN
2.0000 mg | INTRAMUSCULAR | Status: AC | PRN
  Administered 2020-12-11 – 2020-12-14 (×3): 2 mg via INTRAVENOUS
  Filled 2020-12-09 (×3): qty 2

## 2020-12-09 MED ORDER — HEPARIN (PORCINE) 25000 UT/250ML-% IV SOLN
900.0000 [IU]/h | INTRAVENOUS | Status: DC
Start: 2020-12-09 — End: 2020-12-10
  Administered 2020-12-09: 900 [IU]/h via INTRAVENOUS
  Filled 2020-12-09: qty 250

## 2020-12-09 MED ORDER — ATROPINE SULFATE 1 % OP SOLN
1.0000 [drp] | Freq: Every day | OPHTHALMIC | Status: DC
  Administered 2020-12-09 – 2020-12-17 (×45): 1 [drp] via OPHTHALMIC
  Filled 2020-12-09: qty 2

## 2020-12-09 MED ORDER — PROPOFOL 1000 MG/100ML IV EMUL
5.0000 ug/kg/min | INTRAVENOUS | Status: DC
  Administered 2020-12-09: 80 ug/kg/min via INTRAVENOUS
  Administered 2020-12-10 (×7): 70 ug/kg/min via INTRAVENOUS
  Administered 2020-12-10: 04:00:00 50 ug/kg/min via INTRAVENOUS
  Administered 2020-12-11 – 2020-12-13 (×21): 70 ug/kg/min via INTRAVENOUS
  Administered 2020-12-14: 60 ug/kg/min via INTRAVENOUS
  Administered 2020-12-14: 75 ug/kg/min via INTRAVENOUS
  Administered 2020-12-14: 50 ug/kg/min via INTRAVENOUS
  Administered 2020-12-14: 80 ug/kg/min via INTRAVENOUS
  Administered 2020-12-14: 70 ug/kg/min via INTRAVENOUS
  Administered 2020-12-14: 60 ug/kg/min via INTRAVENOUS
  Administered 2020-12-15 (×2): 70 ug/kg/min via INTRAVENOUS
  Administered 2020-12-15 (×2): 75 ug/kg/min via INTRAVENOUS
  Administered 2020-12-15 – 2020-12-16 (×7): 80 ug/kg/min via INTRAVENOUS
  Administered 2020-12-16: 75 ug/kg/min via INTRAVENOUS
  Administered 2020-12-16: 80 ug/kg/min via INTRAVENOUS
  Administered 2020-12-16: 75 ug/kg/min via INTRAVENOUS
  Administered 2020-12-17 (×4): 80 ug/kg/min via INTRAVENOUS
  Filled 2020-12-09 (×14): qty 100
  Filled 2020-12-09: qty 200
  Filled 2020-12-09 (×41): qty 100

## 2020-12-09 MED ORDER — BISACODYL 5 MG PO TBEC: 10.0000 mg | DELAYED_RELEASE_TABLET | Freq: Every day | ORAL | Status: AC

## 2020-12-09 MED ORDER — POTASSIUM CHLORIDE 10 MEQ/100ML IV SOLN
10.0000 meq | INTRAVENOUS | Status: AC
  Administered 2020-12-09 (×3): 10 meq via INTRAVENOUS
  Filled 2020-12-09 (×3): qty 100

## 2020-12-09 MED ORDER — PROPOFOL 1000 MG/100ML IV EMUL: 0.0000 ug/kg/min | INTRAVENOUS | Status: DC

## 2020-12-09 MED ORDER — OLANZAPINE 10 MG PO TABS
10.0000 mg | ORAL_TABLET | Freq: Every day | ORAL | Status: DC
  Administered 2020-12-09: 10 mg via ORAL
  Filled 2020-12-09 (×2): qty 1

## 2020-12-09 MED ORDER — NITROGLYCERIN 2 % TD OINT
0.5000 [in_us] | TOPICAL_OINTMENT | Freq: Four times a day (QID) | TRANSDERMAL | Status: DC
  Filled 2020-12-09 (×2): qty 1

## 2020-12-09 MED ORDER — RISPERIDONE 1 MG PO TBDP
2.0000 mg | ORAL_TABLET | Freq: Three times a day (TID) | ORAL | Status: DC | PRN
  Administered 2020-12-09: 2 mg via ORAL
  Filled 2020-12-09: qty 4
  Filled 2020-12-09: qty 2

## 2020-12-09 MED ORDER — HYDRALAZINE HCL 20 MG/ML IJ SOLN: 10.0000 mg | INTRAMUSCULAR | Status: DC | PRN

## 2020-12-09 MED ORDER — PANTOPRAZOLE SODIUM 40 MG IV SOLR
40.0000 mg | INTRAVENOUS | Status: DC
  Administered 2020-12-09 – 2020-12-13 (×5): 40 mg via INTRAVENOUS
  Filled 2020-12-09 (×5): qty 40

## 2020-12-09 MED ORDER — CARBAMAZEPINE 200 MG PO TABS
200.0000 mg | ORAL_TABLET | Freq: Three times a day (TID) | ORAL | Status: DC
  Administered 2020-12-09: 200 mg via ORAL
  Filled 2020-12-09 (×3): qty 1

## 2020-12-09 MED ORDER — PROPOFOL 1000 MG/100ML IV EMUL
INTRAVENOUS | Status: AC
  Administered 2020-12-09: 30 ug/kg/min via INTRAVENOUS
  Filled 2020-12-09: qty 100

## 2020-12-09 MED ORDER — IPRATROPIUM-ALBUTEROL 0.5-2.5 (3) MG/3ML IN SOLN
RESPIRATORY_TRACT | Status: AC
  Filled 2020-12-09: qty 3

## 2020-12-09 MED ORDER — TRAZODONE HCL 100 MG PO TABS
100.0000 mg | ORAL_TABLET | Freq: Every day | ORAL | Status: DC
  Administered 2020-12-09: 100 mg via ORAL
  Filled 2020-12-09: qty 1

## 2020-12-09 MED ORDER — LATANOPROST 0.005 % OP SOLN
1.0000 [drp] | Freq: Every day | OPHTHALMIC | Status: DC
  Administered 2020-12-09 – 2020-12-16 (×8): 1 [drp] via OPHTHALMIC
  Filled 2020-12-09: qty 2.5

## 2020-12-09 MED ORDER — LORAZEPAM 1 MG PO TABS
1.0000 mg | ORAL_TABLET | Freq: Every day | ORAL | Status: DC
  Administered 2020-12-09: 1 mg via ORAL
  Filled 2020-12-09: qty 1

## 2020-12-09 MED ORDER — IPRATROPIUM-ALBUTEROL 0.5-2.5 (3) MG/3ML IN SOLN
3.0000 mL | Freq: Once | RESPIRATORY_TRACT | Status: AC
  Administered 2020-12-09: 3 mL via RESPIRATORY_TRACT
  Filled 2020-12-09: qty 3

## 2020-12-09 MED ORDER — HYDRALAZINE HCL 20 MG/ML IJ SOLN
10.0000 mg | Freq: Four times a day (QID) | INTRAMUSCULAR | Status: DC | PRN
  Administered 2020-12-09: 10 mg via INTRAVENOUS
  Filled 2020-12-09: qty 1

## 2020-12-09 MED ORDER — DIPHENHYDRAMINE HCL 50 MG/ML IJ SOLN
INTRAMUSCULAR | Status: AC
  Administered 2020-12-09: 50 mg
  Filled 2020-12-09: qty 1

## 2020-12-09 MED ORDER — CARVEDILOL 25 MG PO TABS: 25.0000 mg | ORAL_TABLET | Freq: Two times a day (BID) | ORAL | Status: DC

## 2020-12-09 MED ORDER — PANTOPRAZOLE SODIUM 40 MG PO TBEC: 40.0000 mg | DELAYED_RELEASE_TABLET | Freq: Every day | ORAL | Status: DC

## 2020-12-09 MED ORDER — SODIUM CHLORIDE 0.9 % IV SOLN
250.0000 mL | INTRAVENOUS | Status: DC
  Administered 2020-12-10 – 2020-12-13 (×2): 250 mL via INTRAVENOUS

## 2020-12-09 MED ORDER — METHYLPREDNISOLONE SODIUM SUCC 125 MG IJ SOLR: 125.0000 mg | Freq: Once | INTRAMUSCULAR | Status: DC

## 2020-12-09 MED ORDER — KETAMINE HCL 50 MG/5ML IJ SOSY: 0.5000 mg/kg | PREFILLED_SYRINGE | Freq: Once | INTRAMUSCULAR | Status: AC

## 2020-12-09 MED ORDER — VECURONIUM BROMIDE 10 MG IV SOLR
10.0000 mg | Freq: Once | INTRAVENOUS | Status: AC
Start: 2020-12-09 — End: 2020-12-09

## 2020-12-09 MED ORDER — LABETALOL HCL 5 MG/ML IV SOLN: 10.0000 mg | INTRAVENOUS | Status: DC | PRN

## 2020-12-09 MED ORDER — ASPIRIN 81 MG PO CHEW: 234.0000 mg | CHEWABLE_TABLET | Freq: Once | ORAL | Status: DC

## 2020-12-09 MED ORDER — FLEET ENEMA 7-19 GM/118ML RE ENEM: 1.0000 | ENEMA | Freq: Every day | RECTAL | Status: DC | PRN

## 2020-12-09 MED ORDER — POTASSIUM CHLORIDE IN NACL 40-0.9 MEQ/L-% IV SOLN
INTRAVENOUS | Status: DC
  Filled 2020-12-09: qty 1000

## 2020-12-09 MED ORDER — CHLORHEXIDINE GLUCONATE 0.12% ORAL RINSE (MEDLINE KIT)
15.0000 mL | Freq: Two times a day (BID) | OROMUCOSAL | Status: DC
  Administered 2020-12-09 – 2020-12-17 (×16): 15 mL via OROMUCOSAL

## 2020-12-09 MED ORDER — DOCUSATE SODIUM 50 MG/5ML PO LIQD
100.0000 mg | Freq: Two times a day (BID) | ORAL | Status: DC
  Administered 2020-12-09 – 2020-12-16 (×10): 100 mg
  Filled 2020-12-09 (×10): qty 10

## 2020-12-09 MED ORDER — HEPARIN SODIUM (PORCINE) 5000 UNIT/ML IJ SOLN: 5000.0000 [IU] | Freq: Three times a day (TID) | INTRAMUSCULAR | Status: DC

## 2020-12-09 MED ORDER — ENOXAPARIN SODIUM 40 MG/0.4ML IJ SOSY: 40.0000 mg | PREFILLED_SYRINGE | INTRAMUSCULAR | Status: DC

## 2020-12-09 MED ORDER — FENTANYL 2500MCG IN NS 250ML (10MCG/ML) PREMIX INFUSION
INTRAVENOUS | Status: AC
  Administered 2020-12-09: 75 ug/h via INTRAVENOUS
  Filled 2020-12-09: qty 250

## 2020-12-09 MED ORDER — ORAL CARE MOUTH RINSE
15.0000 mL | OROMUCOSAL | Status: DC
  Administered 2020-12-09 – 2020-12-17 (×72): 15 mL via OROMUCOSAL

## 2020-12-09 MED ORDER — ZIPRASIDONE MESYLATE 20 MG IM SOLR
20.0000 mg | Freq: Once | INTRAMUSCULAR | Status: AC
  Administered 2020-12-09: 20 mg via INTRAMUSCULAR
  Filled 2020-12-09: qty 20

## 2020-12-09 MED ORDER — VECURONIUM BROMIDE 10 MG IV SOLR
INTRAVENOUS | Status: AC
  Administered 2020-12-09: 10 mg via INTRAVENOUS
  Filled 2020-12-09: qty 10

## 2020-12-09 MED ORDER — MIDAZOLAM HCL 2 MG/2ML IJ SOLN: 4.0000 mg | Freq: Once | INTRAMUSCULAR | Status: AC

## 2020-12-09 MED ORDER — MIDAZOLAM HCL 2 MG/2ML IJ SOLN
2.0000 mg | INTRAMUSCULAR | Status: DC | PRN
  Administered 2020-12-12 – 2020-12-14 (×8): 2 mg via INTRAVENOUS
  Filled 2020-12-09 (×9): qty 2

## 2020-12-09 MED ORDER — KETAMINE HCL 50 MG/5ML IJ SOSY
0.5000 mg/kg | PREFILLED_SYRINGE | Freq: Once | INTRAMUSCULAR | Status: AC
  Administered 2020-12-09: 35 mg via INTRAVENOUS
  Filled 2020-12-09: qty 5

## 2020-12-09 MED ORDER — HEPARIN BOLUS VIA INFUSION
4000.0000 [IU] | Freq: Once | INTRAVENOUS | Status: AC
  Administered 2020-12-09: 4000 [IU] via INTRAVENOUS
  Filled 2020-12-09: qty 4000

## 2020-12-09 MED ORDER — FENTANYL 2500MCG IN NS 250ML (10MCG/ML) PREMIX INFUSION: 50.0000 ug/h | INTRAVENOUS | Status: DC

## 2020-12-09 MED ORDER — RISPERIDONE 1 MG PO TABS: 1.0000 mg | ORAL_TABLET | Freq: Every day | ORAL | Status: DC

## 2020-12-09 MED ORDER — POLYETHYLENE GLYCOL 3350 17 G PO PACK: 17.0000 g | PACK | Freq: Two times a day (BID) | ORAL | Status: DC

## 2020-12-09 MED ORDER — BISACODYL 10 MG RE SUPP: 10.0000 mg | Freq: Once | RECTAL | Status: DC

## 2020-12-09 MED ORDER — LACTATED RINGERS IV SOLN: INTRAVENOUS | Status: DC

## 2020-12-09 MED ORDER — FENTANYL CITRATE (PF) 100 MCG/2ML IJ SOLN
INTRAMUSCULAR | Status: AC
  Administered 2020-12-09: 200 ug via INTRAVENOUS
  Filled 2020-12-09: qty 4

## 2020-12-09 MED ORDER — FESOTERODINE FUMARATE ER 8 MG PO TB24
8.0000 mg | ORAL_TABLET | Freq: Every day | ORAL | Status: DC
  Filled 2020-12-09 (×2): qty 1

## 2020-12-09 MED ORDER — DORZOLAMIDE HCL-TIMOLOL MAL 2-0.5 % OP SOLN
1.0000 [drp] | Freq: Two times a day (BID) | OPHTHALMIC | Status: DC
  Administered 2020-12-09 – 2020-12-16 (×15): 1 [drp] via OPHTHALMIC
  Filled 2020-12-09: qty 10

## 2020-12-09 MED ORDER — BISACODYL 5 MG PO TBEC: 10.0000 mg | DELAYED_RELEASE_TABLET | Freq: Two times a day (BID) | ORAL | Status: DC | PRN

## 2020-12-09 MED ORDER — METHYLPREDNISOLONE SODIUM SUCC 125 MG IJ SOLR
INTRAMUSCULAR | Status: AC
  Administered 2020-12-09: 125 mg via INTRAVENOUS
  Filled 2020-12-09: qty 2

## 2020-12-09 MED ORDER — AMLODIPINE BESYLATE 5 MG PO TABS
5.0000 mg | ORAL_TABLET | Freq: Every day | ORAL | Status: DC
  Administered 2020-12-09: 5 mg via ORAL
  Filled 2020-12-09: qty 1

## 2020-12-09 MED ORDER — POLYETHYLENE GLYCOL 3350 17 G PO PACK
17.0000 g | PACK | Freq: Every day | ORAL | Status: DC
  Administered 2020-12-10 – 2020-12-15 (×5): 17 g
  Filled 2020-12-09 (×5): qty 1

## 2020-12-09 MED ORDER — FUROSEMIDE 10 MG/ML IJ SOLN
40.0000 mg | Freq: Once | INTRAMUSCULAR | Status: AC
  Administered 2020-12-09: 40 mg via INTRAVENOUS
  Filled 2020-12-09: qty 4

## 2020-12-09 MED ORDER — LORAZEPAM 1 MG PO TABS
1.0000 mg | ORAL_TABLET | ORAL | Status: AC | PRN
  Administered 2020-12-09: 1 mg via ORAL
  Filled 2020-12-09: qty 1

## 2020-12-09 MED ORDER — POTASSIUM CHLORIDE 10 MEQ/100ML IV SOLN
10.0000 meq | INTRAVENOUS | Status: AC
  Administered 2020-12-09 (×4): 10 meq via INTRAVENOUS
  Filled 2020-12-09 (×4): qty 100

## 2020-12-09 MED ORDER — ASPIRIN EC 81 MG PO TBEC
81.0000 mg | DELAYED_RELEASE_TABLET | Freq: Every day | ORAL | Status: DC
  Administered 2020-12-09: 81 mg via ORAL
  Filled 2020-12-09: qty 1

## 2020-12-09 MED ORDER — SODIUM CHLORIDE 0.9 % IV SOLN: INTRAVENOUS | Status: DC

## 2020-12-09 MED ORDER — KETAMINE HCL 50 MG/5ML IJ SOSY
PREFILLED_SYRINGE | INTRAMUSCULAR | Status: AC
  Administered 2020-12-09: 35 mg via INTRAVENOUS
  Filled 2020-12-09: qty 5

## 2020-12-09 MED ORDER — FENTANYL BOLUS VIA INFUSION
50.0000 ug | INTRAVENOUS | Status: DC | PRN
  Administered 2020-12-09: 100 ug via INTRAVENOUS
  Administered 2020-12-13: 19:00:00 50 ug via INTRAVENOUS
  Filled 2020-12-09: qty 100

## 2020-12-09 MED ORDER — ESCITALOPRAM OXALATE 10 MG PO TABS
10.0000 mg | ORAL_TABLET | Freq: Every day | ORAL | Status: DC
  Administered 2020-12-09: 10 mg via ORAL
  Filled 2020-12-09: qty 1

## 2020-12-09 MED ORDER — METHYLPREDNISOLONE SODIUM SUCC 40 MG IJ SOLR: 40.0000 mg | Freq: Once | INTRAMUSCULAR | Status: DC

## 2020-12-09 MED ORDER — FENTANYL 2500MCG IN NS 250ML (10MCG/ML) PREMIX INFUSION
0.0000 ug/h | INTRAVENOUS | Status: DC
  Administered 2020-12-10: 06:00:00 200 ug/h via INTRAVENOUS
  Administered 2020-12-10: 15:00:00 400 ug/h via INTRAVENOUS
  Administered 2020-12-10 – 2020-12-13 (×7): 300 ug/h via INTRAVENOUS
  Administered 2020-12-13: 400 ug/h via INTRAVENOUS
  Administered 2020-12-13 – 2020-12-14 (×2): 300 ug/h via INTRAVENOUS
  Administered 2020-12-14 – 2020-12-17 (×12): 400 ug/h via INTRAVENOUS
  Filled 2020-12-09 (×24): qty 250

## 2020-12-09 MED ORDER — LACTULOSE ENEMA
300.0000 mL | Freq: Once | ORAL | Status: AC
  Administered 2020-12-09: 300 mL via RECTAL
  Filled 2020-12-09: qty 300

## 2020-12-09 MED ORDER — HYDROXYZINE HCL 50 MG PO TABS
50.0000 mg | ORAL_TABLET | Freq: Three times a day (TID) | ORAL | Status: DC | PRN
  Administered 2020-12-13: 21:00:00 50 mg via ORAL
  Filled 2020-12-09 (×2): qty 1

## 2020-12-09 NOTE — ED Notes (Signed)
Attempted report 

## 2020-12-09 NOTE — Progress Notes (Addendum)
PROGRESS NOTE  Ryan Mayer:[id number] DOB: [birth year]   PCP: Lauro Regulus, MD  Patient is from: Group home  DOA: 12/16/20 LOS: 0  Chief complaints:  Chief Complaint  Patient presents with   Hypertension     Brief Narrative / Interim history: 65 year old M with PMH of intellectual disability, agitation, epilepsy, HTN, chronic constipation, ingestion of nonedible products and hypertension sent to ED from group home with uncontrolled blood pressure and admitted with acute hyponatremia and agitation.  SBP as high as 212.  DBP is high as 115.  UA and UDS negative.  Patient is also agitated.  Na 122.  Started on normal saline   Subjective: Seen and examined earlier this afternoon.  Patient is somewhat nonverbal.  Patient was agitated the whole day requiring safety sitter and physical and chemical restraints.  He developed respiratory distress with rhonchi and hypoxemia later in the afternoon.  He had lunch but no report of aspiration or vomiting.  He has no history of COPD or CHF.   Objective: Vitals:   12/09/20 1254 12/09/20 1339 12/09/20 1415 12/09/20 1459  BP: (!) 154/90  (!) 161/91 103/81  Pulse: (!) 102  (!) 102 98  Resp: (!) 26  (!) 38 (!) 29  Temp:  99.1 F (37.3 C)  (!) 100.9 F (38.3 C)  TempSrc:  Oral  Rectal  SpO2: 95%  92% 92%  Weight:      Height:       No intake or output data in the 24 hours ending 12/09/20 1511 Filed Weights   12/16/2020 1756  Weight: 70 kg    Examination:  GENERAL: Restless and agitated. HEENT: MMM.  Vision and hearing grossly intact.  NECK: Supple.  No apparent JVD.  RESP: 93% on 4 L.  Some work of breathing.  Very rhonchorous. CVS:  RRR. Heart sounds normal.  ABD/GI/GU: BS+. Abd soft, NTND.  MSK/EXT:  Moves extremities. No apparent deformity. No edema.  SKIN: no apparent skin lesion or wound NEURO: Awake, alert and oriented appropriately.  No apparent focal neuro deficit. PSYCH: Very restless and  agitated.  Procedures:  None  Microbiology summarized: COVID-19 and influenza PCR nonreactive.  Assessment & Plan: Acute respiratory distress with hypoxia-acute onset after lunch.  No signs of aspiration per Recruitment consultant and RN.  Reportedly desaturated to 88% and started on supplemental oxygen.  CXR from earlier this morning without acute finding.  He has some work of breathing.  He is very rhonchorous.  No signs of fluid overload.  He has not received labetalol or Coreg to think of bronchospasm. -Repeat CXR -Check troponin, BNP and BMP -IV Lasix 40 mg x 1.  IV Solu-Medrol 150 mg x 1.  Start DuoNeb. -Patient will be moved to stepdown once bed available.  Discussed with intensivist as well. -Aspiration precaution. -N.p.o. except sips with meds  Acute hyponatremia: Na remains low at 122 on IV NS.  Urine sodium 65 suggesting some element of SIADH.  Patient is on Tegretol and multiple antipsychotic medication with potential for SIADH. Recent Labs  Lab 2020-12-16 1804 12/09/20 0135 12/09/20 0529  NA 122* 121* 122*  -Discontinue IV fluid -IV Lasix 40 mg x 1 as above   Hypertensive urgency: Could be exaggerated by agitation.  Patient seems to be on clonidine and beta-blocker.  Could be withdrawing from this. -Resume home clonidine, Coreg and amlodipine -IV hydralazine as needed with parameters.  Intellectual disability/agitation-patient is from group home.  History of agitation and ingestion of  non-ed stuff.  Patient received droperidol, Geodon, Benadryl, Ativan and ketamine in ED without improvement in agitation.  -Agitation protocol with risperidone, and Ativan -Resume home Zyprexa, Atarax, trazodone, Tegretol.  Normal QTC on EKG. -Continue soft restraints and safety sitter -Psychiatry consulted for assistance  -Bladder scan  Hypokalemia/hypomagnesemia -Replenish and recheck.  Seizure disorder/epilepsy -Resume home Tegretol.  Chronic constipation: KUB with large stool burden.   No tenderness on abdominal exam -Dulcolax p.o./suppository -Fleet enema if no response  Addendum Non-STEMI-troponin elevated to 1162.  BNP 376.  CK4 162.  EKG without acute ischemic finding -Nitropaste -IV metoprolol -Heparin drip [facility] consulted.  Acute urinary retention-bladder scan about 700.  1100 cc on I&O cath -Indwelling Foley for bladder decompression at least for 10 days -Manage constipation as above  Goal of care counseling: Updated patient's legal guardian and discussed CODE STATUS.  Discussed pros and cons of CPR or intubation.  I recommended DNR/DNI but legal guardian would like to discuss with group of people before making those changes. -Remains full code  Class I obesity Body mass index is 30.14 kg/m.         DVT prophylaxis:  enoxaparin (LOVENOX) injection 40 mg Start: 12/09/20 2200  Code Status: Full code  Family Communication: Updated patient's legal guardian Level of care: Stepdown Status is: Inpatient  Remains inpatient appropriate because: Respiratory distress, agitation and electrolyte abnormalities       Consultants:  Psychiatry Intensivist   Sch Meds:  Scheduled Meds:  amLODipine  5 mg Oral Daily   aspirin EC  81 mg Oral Daily   atropine  1 drop Left Eye 6 X Daily   bisacodyl  10 mg Oral Daily   Or   bisacodyl  10 mg Rectal Daily   brimonidine  1 drop Left Eye TID   carbamazepine  200 mg Oral TID   carvedilol  25 mg Oral BID WC   cloNIDine  0.1 mg Oral TID   dorzolamide-timolol  1 drop Left Eye BID   enoxaparin (LOVENOX) injection  40 mg Subcutaneous Q24H   fesoterodine  8 mg Oral Daily   latanoprost  1 drop Left Eye QHS   loratadine  10 mg Oral Daily   LORazepam  1 mg Oral QHS   methylPREDNISolone (SOLU-MEDROL) injection  40 mg Intravenous Once   OLANZapine  10 mg Oral QHS   pantoprazole  40 mg Oral Daily   polyethylene glycol  17 g Oral BID   traZODone  100 mg Oral QHS   Continuous Infusions:  magnesium sulfate  bolus IVPB 2 g (12/09/20 1442)   PRN Meds:.hydrOXYzine, labetalol, risperiDONE **AND** [COMPLETED] LORazepam **AND** ziprasidone, sodium phosphate  Antimicrobials: Anti-infectives (From admission, onward)    None        I have personally reviewed the following labs and images: CBC: Recent Labs  Lab 11/22/2020 1804  WBC 5.6  NEUTROABS 3.3  HGB 13.3  HCT 35.8*  MCV 87.3  PLT 230   BMP &GFR Recent Labs  Lab 11/25/2020 1804 12/09/20 0135 12/09/20 0529  NA 122* 121* 122*  K 3.3* 3.2*  --   CL 91* 93*  --   CO2 24 21*  --   GLUCOSE 103* 143*  --   BUN 14 14  --   CREATININE 0.78 0.96  --   CALCIUM 8.7* 8.5*  --   MG 1.9  --  1.4*   Estimated Creatinine Clearance: 64.6 mL/min (by C-G formula based on SCr of 0.96 mg/dL). Liver &  Pancreas: Recent Labs  Lab 12/10/20 1804  AST 16  ALT 15  ALKPHOS 84  BILITOT 0.7  PROT 7.4  ALBUMIN 4.0   No results for input(s): LIPASE, AMYLASE in the last 168 hours. No results for input(s): AMMONIA in the last 168 hours. Diabetic: No results for input(s): HGBA1C in the last 72 hours. No results for input(s): GLUCAP in the last 168 hours. Cardiac Enzymes: No results for input(s): CKTOTAL, CKMB, CKMBINDEX, TROPONINI in the last 168 hours. No results for input(s): PROBNP in the last 8760 hours. Coagulation Profile: No results for input(s): INR, PROTIME in the last 168 hours. Thyroid Function Tests: No results for input(s): TSH, T4TOTAL, FREET4, T3FREE, THYROIDAB in the last 72 hours. Lipid Profile: No results for input(s): CHOL, HDL, LDLCALC, TRIG, CHOLHDL, LDLDIRECT in the last 72 hours. Anemia Panel: No results for input(s): VITAMINB12, FOLATE, FERRITIN, TIBC, IRON, RETICCTPCT in the last 72 hours. Urine analysis:    Component Value Date/Time   COLORURINE YELLOW 12/09/2020 0027   APPEARANCEUR CLEAR 12/09/2020 0027   LABSPEC 1.015 12/09/2020 0027   PHURINE 7.0 12/09/2020 0027   GLUCOSEU NEGATIVE 12/09/2020 0027   HGBUR  NEGATIVE 12/09/2020 0027   BILIRUBINUR NEGATIVE 12/09/2020 0027   KETONESUR NEGATIVE 12/09/2020 0027   PROTEINUR NEGATIVE 12/09/2020 0027   NITRITE NEGATIVE 12/09/2020 0027   LEUKOCYTESUR NEGATIVE 12/09/2020 0027   Sepsis Labs: Invalid input(s): PROCALCITONIN, LACTICIDVEN  Microbiology: Recent Results (from the past 240 hour(s))  Resp Panel by RT-PCR (Flu A&B, Covid) Nasopharyngeal Swab     Status: None   Collection Time: 2020-12-10 11:55 PM   Specimen: Nasopharyngeal Swab; Nasopharyngeal(NP) swabs in vial transport medium  Result Value Ref Range Status   SARS Coronavirus 2 by RT PCR NEGATIVE NEGATIVE Final    Comment: (NOTE) SARS-CoV-2 target nucleic acids are NOT DETECTED.  The SARS-CoV-2 RNA is generally detectable in upper respiratory specimens during the acute phase of infection. The lowest concentration of SARS-CoV-2 viral copies this assay can detect is 138 copies/mL. A negative result does not preclude SARS-Cov-2 infection and should not be used as the sole basis for treatment or other patient management decisions. A negative result may occur with  improper specimen collection/handling, submission of specimen other than nasopharyngeal swab, presence of viral mutation(s) within the areas targeted by this assay, and inadequate number of viral copies(<138 copies/mL). A negative result must be combined with clinical observations, patient history, and epidemiological information. The expected result is Negative.  Fact Sheet for Patients:  BloggerCourse.com  Fact Sheet for Healthcare Providers:  SeriousBroker.it  This test is no t yet approved or cleared by the [place] FDA and  has been authorized for detection and/or diagnosis of SARS-CoV-2 by FDA under an Emergency Use Authorization (EUA). This EUA will remain  in effect (meaning this test can be used) for the duration of the COVID-19 declaration under Section  564(b)(1) of the Act, 21 U.S.C.section 360bbb-3(b)(1), unless the authorization is terminated  or revoked sooner.       Influenza A by PCR NEGATIVE NEGATIVE Final   Influenza B by PCR NEGATIVE NEGATIVE Final    Comment: (NOTE) The Xpert Xpress SARS-CoV-2/FLU/RSV plus assay is intended as an aid in the diagnosis of influenza from Nasopharyngeal swab specimens and should not be used as a sole basis for treatment. Nasal washings and aspirates are unacceptable for Xpert Xpress SARS-CoV-2/FLU/RSV testing.  Fact Sheet for Patients: BloggerCourse.com  Fact Sheet for Healthcare Providers: SeriousBroker.it  This test is not yet approved or  cleared by the [place] and has been authorized for detection and/or diagnosis of SARS-CoV-2 by FDA under an Emergency Use Authorization (EUA). This EUA will remain in effect (meaning this test can be used) for the duration of the COVID-19 declaration under Section 564(b)(1) of the Act, 21 U.S.C. section 360bbb-3(b)(1), unless the authorization is terminated or revoked.  Performed at [facility], [address] [place], [place] [address]     Radiology Studies: DG Chest Portable 1 View  Result Date: 12/09/2020 CLINICAL DATA:  Worsened shortness of breath and hyponatremia EXAM: PORTABLE CHEST 1 VIEW COMPARISON:  Earlier today at 8:27 a.m. FINDINGS: AP portable radiograph. Numerous leads and wires project over the chest. Patient rotated minimally left. Midline trachea. Normal heart size. Atherosclerosis in the transverse aorta. No pleural effusion or pneumothorax. Mild right hemidiaphragm elevation. No congestive failure. Low lung volumes with resultant pulmonary interstitial prominence. IMPRESSION: Cardiomegaly and low lung volumes.  No acute findings. Aortic Atherosclerosis (ICD10-I70.0). Electronically Signed   By: Jeronimo Greaves M.D.   On: 12/09/2020 14:54   DG Chest Port 1  View  Result Date: 12/09/2020 CLINICAL DATA:  65 year old male with fever and constipation. Intellectual disability. EXAM: PORTABLE CHEST 1 VIEW COMPARISON:  Portable chest 09/30/2020 and earlier. FINDINGS: Portable AP supine view at 0827 hours. Continued low but mildly improved lung volumes. Mediastinal contours are stable and within normal limits. Similar mild crowding of lung markings at the right hilum. But otherwise Allowing for portable technique the lungs are clear. Visualized tracheal air column is within normal limits. Left upper quadrant chronically ingested metallic wires appear mixed with retained stool in the colon at the splenic flexure as on the September CT. No acute osseous abnormality identified. IMPRESSION: Low but mildly improved lung volumes since 10/01/2018. No acute cardiopulmonary abnormality. Electronically Signed   By: Odessa Fleming M.D.   On: 12/09/2020 08:45   DG Abd Portable 1V  Result Date: 12/09/2020 CLINICAL DATA:  65 year old male with fever and constipation. Intellectual disability. EXAM: PORTABLE ABDOMEN - 1 VIEW COMPARISON:  CT Abdomen and Pelvis 09/30/2020 and earlier. FINDINGS: Portable AP supine view at 0829 hours. Nonobstructed bowel gas pattern, but there is a moderate to large volume of retained stool in the colon. This is similar to the September CT Abdomen and Pelvis. Other abdominal and pelvic visceral contours appear normal. No acute osseous abnormality identified. IMPRESSION: Moderate to large volume of retained stool in the colon, similar to September CT Abdomen and Pelvis. Electronically Signed   By: Odessa Fleming M.D.   On: 12/09/2020 08:43    CRITICAL CARE Performed by: Almon Hercules   Total critical care time: 55 minutes  Critical care time was exclusive of separately billable procedures and treating other patients.  Critical care was necessary to treat or prevent imminent or life-threatening deterioration.  Critical care was time spent personally by me on  the following activities: development of treatment plan with patient and/or surrogate as well as nursing, discussions with consultants, evaluation of patient's response to treatment, examination of patient, obtaining history from patient or surrogate, ordering and performing treatments and interventions, ordering and review of laboratory studies, ordering and review of radiographic studies, pulse oximetry and re-evaluation of patient's condition.    Shimon Trowbridge T. Tami Barren [facility]  If 7PM-7AM, please contact night-coverage [email or web address] 12/09/2020, 3:11 PM

## 2020-12-09 NOTE — ED Notes (Signed)
Violent restraints removed and non-violent wrist restraints applied

## 2020-12-09 NOTE — Consult Note (Signed)
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  Cardiology is consulted because a troponin was checked and was elevated to 1100.   History is unable to be obtained from the patient due to his intellectual disability and severe agitation.  Per report, he has been more agitated and aggressive than usual and has not been sleeping.  In this setting his blood pressures been elevated and thus was referred to the emergency department for further evaluation.  His review of systems is otherwise negative for chest pain, abdominal pain, shortness of breath.   On arrival to the emergency department his blood pressure was 165/115, though his maximum SBP was 212, with a heart rate as elevated as 126.  Labs are remarkable for a sodium of 122, potassium of 3, creatinine of 0.75, BNP of 376.  He received multiple antipsychotic medicines.  At some point today his respiratory status changed to some degree, and a troponin was checked and was elevated to 1100.  Cardiology is consulted for further evaluation.   hospitalized in September 2022 with hypotension with adjustment/discontinuation of multiple antihypertensives, who was sent to the ED because of uncontrolled blood pressures at his group home.   PCCM consulted for severe SOB and dyspnea and severe agitation    Significant  Hospital Events: Including procedures, antibiotic start and stop dates in addition to other pertinent events   11/29 intubated, sedated      Micro Data:  COVID/FLU NEG   Antimicrobials:   Antibiotics Given (last 72 hours)     None            Interim History / Subjective:  Admitted to SD, transitioned to ICU immediately, admitted from ER with bloody nose and mouth, likely aspiration of bloody secretions, emergently intubated      Objective   Blood pressure (!) 153/88, pulse 90, temperature 98.3 F (36.8 C), temperature source Oral, resp. rate (!) 21, height 5' (1.524 m), weight 67.7 kg, SpO2 95 %.        Intake/Output Summary (Last 24 hours) at 12/09/2020 1751 Last data filed at 12/09/2020 1542 Gross per 24 hour  Intake --  Output 1100 ml  Net -1100 ml   Filed Weights   12/03/2020 1756 12/09/20 1620  Weight: 70 kg 67.7 kg      REVIEW OF SYSTEMS  PATIENT IS UNABLE TO PROVIDE COMPLETE REVIEW OF SYSTEMS DUE TO SEVERE CRITICAL ILLNESS AND TOXIC METABOLIC ENCEPHALOPATHY   PHYSICAL EXAMINATION:  GENERAL:critically ill appearing, +resp distress EYES: Pupils equal, round, reactive to light.  No scleral icterus.  MOUTH: Moist mucosal membrane. INTUBATED NECK: Supple.  PULMONARY: +rhonchi, +wheezing CARDIOVASCULAR: S1 and S2.  No murmurs  GASTROINTESTINAL: Soft, nontender, -distended. Positive bowel sounds.  MUSCULOSKELETAL: No swelling, clubbing, or edema.  NEUROLOGIC: obtunded SKIN:intact,warm,dry     Labs/imaging that I havepersonally reviewed  (right click and "Reselect all SmartList Selections" daily)        ASSESSMENT AND PLAN SYNOPSIS 65  yo with cognitive dysfunction with psychiatric and neurological diease with severe resp failure due to aspiration of bloody secretions leading to asphyxia leading to emergent intubation with severe hyponatremia   Severe ACUTE Hypoxic and Hypercapnic Respiratory Failure -continue Mechanical Ventilator  support -continue Bronchodilator Therapy -Wean Fio2 and PEEP as tolerated -VAP/VENT bundle implementation -will perform SAT/SBT when respiratory parameters are met  CARDIAC ICU monitoring    KIDNEY  -continue Foley Catheter-assess need -Avoid nephrotoxic agents -Follow urine output, BMP -Ensure adequate renal perfusion, optimize oxygenation -Renal dose medications   Intake/Output Summary (Last 24 hours) at 12/09/2020 1751 Last data filed at 12/09/2020 1542 Gross per 24 hour  Intake --  Output 1100 ml  Net -1100 ml   BMP Latest Ref Rng & Units 12/09/2020 12/09/2020 12/09/2020  Glucose 70 - 99 mg/dL 136(H) - 143(H)  BUN 8 - 23 mg/dL 14 - 14  Creatinine 0.61 - 1.24 mg/dL 0.75 - 0.96  Sodium 135 - 145 mmol/L 122(L) 122(L) 121(L)  Potassium 3.5 - 5.1 mmol/L 3.0(L) - 3.2(L)  Chloride 98 - 111 mmol/L 97(L) - 93(L)  CO2 22 - 32 mmol/L 18(L) - 21(L)  Calcium 8.9 - 10.3 mg/dL 7.7(L) - 8.5(L)      NEUROLOGY Acute toxic metabolic encephalopathy due to hyponatremia and hypoxia,  need for sedation Goal RASS -2 to -3   SHOCK HYPOVOLEMIA SOURCE-dehydration -use vasopressors to keep MAP>65 as needed -follow ABG and LA -follow up cultures -aggressive IV fluid resuscitation  INFECTIOUS DISEASE -follow up cultures   ENDO - ICU hypoglycemic\Hyperglycemia protocol -check FSBS per protocol   GI GI PROPHYLAXIS as indicated  NUTRITIONAL STATUS DIET-->TF's as tolerated Constipation protocol as indicated   ELECTROLYTES -follow labs as needed -replace as needed -pharmacy consultation and following   ACUTE ANEMIA- TRANSFUSE AS NEEDED CONSIDER TRANSFUSION  IF HGB<7 DVT PRX with TED/SCD's ONLY     Best practice (right click and "Reselect all SmartList Selections" daily)  Diet: NPO Pain/Anxiety/Delirium protocol (if indicated): Yes (RASS goal -2) VAP protocol (if indicated): Yes DVT prophylaxis: Subcutaneous Heparin GI prophylaxis: PPI Foley:  N/A Mobility:   bed rest  Code Status:  FULL Disposition:ICU  Labs   CBC: Recent Labs  Lab 12/03/2020 1804  WBC 5.6  NEUTROABS 3.3  HGB 13.3  HCT 35.8*  MCV 87.3  PLT 098    Basic Metabolic Panel: Recent Labs  Lab 11/17/2020 1804 12/09/20 0135 12/09/20 0529 12/09/20 1459  NA 122* 121* 122* 122*  K 3.3* 3.2*  --  3.0*  CL 91* 93*  --  97*  CO2 24 21*  --  18*  GLUCOSE 103* 143*  --  136*  BUN 14 14  --  14  CREATININE 0.78 0.96  --  0.75  CALCIUM 8.7* 8.5*  --  7.7*  MG 1.9  --  1.4*  --    GFR: Estimated Creatinine Clearance: 76.3 mL/min (by C-G formula based on SCr of 0.75 mg/dL). Recent Labs  Lab 11/24/2020 1804  WBC 5.6    Liver Function Tests: Recent Labs  Lab 11/30/2020 1804  AST 16  ALT 15  ALKPHOS 84  BILITOT 0.7  PROT 7.4  ALBUMIN 4.0   No results for input(s): LIPASE, AMYLASE in the last 168 hours. No results for input(s): AMMONIA in the last 168 hours.  ABG No results found for: PHART, PCO2ART, PO2ART, HCO3, TCO2, ACIDBASEDEF, O2SAT   Coagulation Profile: No results for input(s): INR, PROTIME in the last 168 hours.  Cardiac Enzymes: Recent Labs  Lab 12/09/20 1516  CKTOTAL 462*    HbA1C: No results found for: HGBA1C  CBG: No results for input(s): GLUCAP in the last 168 hours.   Past Medical History:  He,  has a past medical history of Hypertension, Intellectual disability, and Seizure ([place]).   Surgical History:  History reviewed. No pertinent surgical history.   Social History:   reports that he has never smoked. He has never used smokeless tobacco. He reports that he does not drink alcohol and does not use drugs.   Family History:  His family history is not on file.   Allergies No Known Allergies   Home Medications  Prior to Admission medications   Medication Sig Start Date End Date Taking? Authorizing Provider  acetaZOLAMIDE ER (DIAMOX) 500 MG capsule Take 500 mg by mouth every other day. 12/07/20  Yes [provider]   amLODipine (NORVASC) 5 MG tablet Take 5 mg by mouth daily. 09/10/19  Yes [provider]  aspirin EC 81 MG tablet Take 81 mg by mouth daily. Swallow whole.   Yes [provider]  atropine 1 % ophthalmic solution Place 1 drop into the left eye 6 (six) times daily.   Yes [provider]  brimonidine (ALPHAGAN) 0.2 % ophthalmic solution Place 1 drop into the left eye 3 (three) times daily.   Yes [provider]  carbamazepine (TEGRETOL) 200 MG tablet Take 200 mg by mouth 3 (three) times daily. 09/10/19  Yes [provider]  carvedilol (COREG) 25 MG tablet Take 25 mg by mouth 2 (two) times daily with a meal.   Yes [provider]  cetirizine (ZYRTEC) 10 MG tablet Take 10 mg by mouth 2 (two) times daily.   Yes [provider]  cloNIDine (CATAPRES) 0.1 MG tablet Take 0.1 mg by mouth 3 (three) times daily. 0700, NOON, 1800   Yes [provider]  docusate sodium (COLACE) 100 MG capsule Take 100 mg by mouth 3 (three) times daily as needed for mild constipation or moderate constipation.   Yes [provider]  dorzolamide-timolol (COSOPT) 22.3-6.8 MG/ML ophthalmic solution Place 1 drop into the left eye 2 (two) times daily.   Yes [provider]  Emollient (CETAPHIL) cream Apply 1 application topically as directed. Apply after bath   Yes [provider]  escitalopram (LEXAPRO) 10 MG tablet Take 10 mg by mouth daily. 09/10/19  Yes [provider]  fesoterodine (TOVIAZ) 8 MG TB24 tablet Take 8 mg by mouth daily.   Yes [provider]  fluticasone (FLONASE) 50 MCG/ACT nasal spray Place 1 spray into both nostrils 2 (two) times daily. 09/19/19  Yes [provider]  lisinopril (ZESTRIL) 20 MG tablet Take 20 mg by mouth daily. 11/07/20  Yes [provider]  lisinopril-hydrochlorothiazide (ZESTORETIC) 20-12.5 MG tablet Take 1 tablet by mouth daily. 12/07/20  Yes [provider]   LORazepam (ATIVAN) 1 MG tablet Take 1 mg by mouth in the morning and at bedtime. (1700 & 2000)   Yes [provider]  pantoprazole (PROTONIX) 40 MG tablet Take 40 mg by mouth daily. 09/10/19  Yes [provider]  prednisoLONE acetate (PRED FORTE) 1 % ophthalmic suspension Place 1 drop into the left eye 6 (six) times daily.   Yes [provider]  risperiDONE (RISPERDAL) 0.5 MG tablet Take 1 mg by mouth See admin instructions. Take 2 tablets (1 mg) by mouth every morning and 2 tablets (1 mg) at bedtime   Yes [provider]  acetaminophen (TYLENOL) 325 MG tablet Take 650 mg by mouth every 4 (four) hours as needed for fever.    [provider]  bisacodyl (DULCOLAX) 5 MG EC tablet Take 10 mg by mouth every 12 (twelve) hours as needed for severe constipation.    [provider]  camphor-menthol Timoteo Ace) lotion Apply 1 application topically as needed for itching.    [provider]  carbamide peroxide (DEBROX) 6.5 % OTIC solution Place 5-10 drops into both ears every Friday.    [provider]  chlorhexidine (PERIDEX) 0.12 % solution Use as directed 5 mLs in the mouth or throat 2 (two) times daily.    [provider]  diphenhydrAMINE (BENADRYL) 25 mg capsule Take 25 mg by mouth every 6 (six) hours as needed.    [provider]  guaiFENesin (ROBITUSSIN) 100 MG/5ML SOLN Take 5 mLs by mouth every 4 (four) hours as needed for cough.    [provider]  hydrOXYzine (ATARAX/VISTARIL) 50 MG tablet Take 50 mg by mouth 3 (three) times daily as needed for itching or anxiety.    [provider]  latanoprost (XALATAN) 0.005 % ophthalmic solution Place 1 drop into the left eye at bedtime.    [provider]  levocetirizine (XYZAL) 5 MG tablet Take 5 mg by mouth at bedtime.  09/10/19   [provider]  loperamide (IMODIUM) 2 MG capsule Take 2-4 mg by mouth See admin instructions. Take 2 capsules  (85m) by mouth after first loose stool then take 1 capsule (253m by mouth after each additional loose stool (PRN)    [provider]  LORazepam (ATIVAN) 1 MG tablet Take 1 mg by mouth daily as needed for anxiety or sedation.    [provider]  metroNIDAZOLE (METROCREAM) 0.75 % cream Apply 1 application topically at bedtime. (Apply to face)    [provider]  naltrexone (DEPADE) 50 MG tablet Take 50 mg by mouth daily. Patient not taking: Reported on 12/09/2020    [provider]  neomycin-bacitracin-polymyxin (NEOSPORIN) ointment Apply 1 application topically daily as needed for wound care.    [provider]  OLANZapine (ZYPREXA) 10 MG tablet Take 10 mg by mouth at bedtime. 12/04/2020   [provider]  polyethylene glycol (MIRALAX / GLYCOLAX) 17 g packet Take 17 g by mouth daily.    [provider]  potassium chloride SA (KLOR-CON) 20 MEQ tablet Take 1 tablet (20 mEq total) by mouth daily. Patient not taking: Reported on 12/09/2020 10/02/20   DaEdwin DadaMD  tolnaftate (TINACTIN) 1 % cream Apply 1 application topically 2 (two) times daily as needed (itchy feet).    [provider]  traZODone (DESYREL) 50 MG tablet Take 50-125 mg by mouth 3 (three) times daily. Take one 50 mg tablet by mouth every morning, one 50 mg tablet at 2 PM, and two and one-half tablets 125 mg at bedtime    [provider]       DVT/GI PRX  assessed I Assessed the need for Labs I Assessed the need for Foley I Assessed the need for Central Venous Line Family Discussion when available I Assessed the need for Mobilization I made an Assessment of medications to be adjusted accordingly Safety Risk assessment completed  CASE DISCUSSED IN MULTIDISCIPLINARY ROUNDS WITH ICU TEAM     Critical Care Time devoted to patient care services described in this note is 75  minutes.   Critical care was necessary to treat /prevent imminent  and life-threatening deterioration.   PATIENT WITH VERY POOR PROGNOSIS I ANTICIPATE PROLONGED ICU LOS  Patient is critically ill. Patient with Multiorgan failure and at high risk for cardiac arrest and death.    Corrin Parker, M.D.  [facility]  Medical Director [place] Director [facility] Cardio-Pulmonary Department

## 2020-12-09 NOTE — Procedures (Signed)
Endotracheal Intubation: Patient required placement of an artificial airway secondary to Respiratory Failure  Consent: Emergent.   Hand washing performed prior to starting the procedure.   Medications administered for sedation prior to procedure:  Midazolam 4 mg IV,  Vecuronium 10 mg IV, Fentanyl 200 mcg IV.    A time out procedure was called and correct patient, name, & ID confirmed. Needed supplies and equipment were assembled and checked to include ETT, 10 ml syringe, Glidescope, Mac and Miller blades, suction, oxygen and bag mask valve, end tidal CO2 monitor.   Patient was positioned to align the mouth and pharynx to facilitate visualization of the glottis.   Heart rate, SpO2 and blood pressure was continuously monitored during the procedure. Pre-oxygenation was conducted prior to intubation and endotracheal tube was placed through the vocal cords into the trachea.     The artificial airway was placed under direct visualization via glidescope route using a 8.0 ETT on the first attempt.  ETT was secured at 23 cm mark.  Placement was confirmed by auscuitation of lungs with good breath sounds bilaterally and no stomach sounds.  Condensation was noted on endotracheal tube.   Pulse ox 98%.  CO2 detector in place with appropriate color change.   Complications: None .   Operator: Montrae Braithwaite.   Chest radiograph ordered and pending.     Corrin Parker, M.D.  [facility]  Medical Director [place] Director [facility] Cardio-Pulmonary Department

## 2020-12-09 NOTE — Progress Notes (Signed)
Patient was admitted to ICU- patient very agitated and restless in bed-at one point patient biting on his hand mit and blood was found all over his face, nose and hand mitts. Patient having audible secretions in the back of the throat that he could not cough up and I could not suction deep enough to clear his airway- Notified Dr. Alanda Slim about patient's current status and that he would need to be intubated- he stated that he consulted Dr. Belia Heman- so I called Dr. Belia Heman and told him that patient needed to be intubated due to his work of breathing and tachypnea in the 40's.  Patient intubated and sedated with propofol and fentanyl.

## 2020-12-09 NOTE — Progress Notes (Signed)
OG tube advanced 7 inches. KUB order placed to confirm tube placement.

## 2020-12-09 NOTE — Progress Notes (Signed)
An USGPIV (ultrasound guided PIV) has been placed for short-term vasopressor infusion. Should this treatment be needed beyond 48hours, best practice supports seeking central line access.  It will be the responsibility of the bedside nurse to follow best practice to prevent extravasations including placing the IVWatch when indicated.   

## 2020-12-09 NOTE — Progress Notes (Signed)
USGpiv g 20 1.88 placed to RAFA. IV watch placed.

## 2020-12-09 NOTE — ED Notes (Signed)
MD made aware of bladder scan and orders for in and out cath

## 2020-12-09 NOTE — ED Notes (Signed)
Writer in room to assist in violent restraint placement on agitated patient that is not able to be directed as well as not able to maintain self or staff safety. Bilateral hand and wrist restraints applied post order placed by Larinda Buttery, MD.

## 2020-12-09 NOTE — ED Notes (Signed)
MD Hendricks Limes that patient has fever of 101.1 Axillary

## 2020-12-09 NOTE — Consult Note (Signed)
[facility] Cardiology  CARDIOLOGY CONSULT NOTE  Patient ID: LYMON KIDNEY MRN: [id number] DOB/AGE: Aug 12, 1955 65 y.o.  Admit date: 11/14/2020 Referring Physician Candelaria Stagers Primary Physician Lauro Regulus, MD Primary Cardiologist NA Reason for Consultation Elevated troponin  HPI:  Melchizedek Espinola is a 65 year old male with history of severe intellectual disability with difficult to control behaviors and agitation, epilepsy with generalized tonic-clonic seizures, hypertension, chronic ingestion of nonedible products who was sent from his group home for elevated blood pressure and worsening agitation.  Cardiology is consulted because a troponin was checked and was elevated to 1100.  History is unable to be obtained from the patient due to his intellectual disability and severe agitation.  Per report, he has been more agitated and aggressive than usual and has not been sleeping.  In this setting his blood pressures been elevated and thus was referred to the emergency department for further evaluation.  His review of systems is otherwise negative for chest pain, abdominal pain, shortness of breath.  On arrival to the emergency department his blood pressure was 165/115, though his maximum SBP was 212, with a heart rate as elevated as 126.  Labs are remarkable for a sodium of 122, potassium of 3, creatinine of 0.75, BNP of 376.  He received multiple antipsychotic medicines.  At some point today his respiratory status changed to some degree, and a troponin was checked and was elevated to 1100.  Cardiology is consulted for further evaluation.  Review of systems complete and found to be negative unless listed above     Past Medical History:  Diagnosis Date   Hypertension    Intellectual disability    Seizure (HCC)     History reviewed. No pertinent surgical history.  Medications Prior to Admission  Medication Sig Dispense Refill Last Dose   acetaZOLAMIDE ER (DIAMOX) 500 MG capsule Take 500 mg by  mouth every other day.   Past Week   amLODipine (NORVASC) 5 MG tablet Take 5 mg by mouth daily.   11/22/2020   aspirin EC 81 MG tablet Take 81 mg by mouth daily. Swallow whole.   11/16/2020   atropine 1 % ophthalmic solution Place 1 drop into the left eye 6 (six) times daily.   11/19/2020   brimonidine (ALPHAGAN) 0.2 % ophthalmic solution Place 1 drop into the left eye 3 (three) times daily.   11/20/2020   carbamazepine (TEGRETOL) 200 MG tablet Take 200 mg by mouth 3 (three) times daily.   11/16/2020   carvedilol (COREG) 25 MG tablet Take 25 mg by mouth 2 (two) times daily with a meal.   11/27/2020   cetirizine (ZYRTEC) 10 MG tablet Take 10 mg by mouth 2 (two) times daily.   11/19/2020   cloNIDine (CATAPRES) 0.1 MG tablet Take 0.1 mg by mouth 3 (three) times daily. 0700, NOON, 1800   12/07/2020 at 1200   docusate sodium (COLACE) 100 MG capsule Take 100 mg by mouth 3 (three) times daily as needed for mild constipation or moderate constipation.   11/10/2020   dorzolamide-timolol (COSOPT) 22.3-6.8 MG/ML ophthalmic solution Place 1 drop into the left eye 2 (two) times daily.   11/11/2020   Emollient (CETAPHIL) cream Apply 1 application topically as directed. Apply after bath   12/07/2020   escitalopram (LEXAPRO) 10 MG tablet Take 10 mg by mouth daily.   12/03/2020   fesoterodine (TOVIAZ) 8 MG TB24 tablet Take 8 mg by mouth daily.   11/16/2020   fluticasone (FLONASE) 50 MCG/ACT nasal spray Place  1 spray into both nostrils 2 (two) times daily.   11/23/2020   lisinopril (ZESTRIL) 20 MG tablet Take 20 mg by mouth daily.   12/03/2020 at am   lisinopril-hydrochlorothiazide (ZESTORETIC) 20-12.5 MG tablet Take 1 tablet by mouth daily.   11/12/2020 at am   LORazepam (ATIVAN) 1 MG tablet Take 1 mg by mouth in the morning and at bedtime. (1700 & 2000)   12/01/2020   pantoprazole (PROTONIX) 40 MG tablet Take 40 mg by mouth daily.   11/12/2020 at am   prednisoLONE acetate (PRED FORTE) 1 % ophthalmic suspension  Place 1 drop into the left eye 6 (six) times daily.   11/10/2020   risperiDONE (RISPERDAL) 0.5 MG tablet Take 1 mg by mouth See admin instructions. Take 2 tablets (1 mg) by mouth every morning and 2 tablets (1 mg) at bedtime   11/23/2020   acetaminophen (TYLENOL) 325 MG tablet Take 650 mg by mouth every 4 (four) hours as needed for fever.   prn at unknown   bisacodyl (DULCOLAX) 5 MG EC tablet Take 10 mg by mouth every 12 (twelve) hours as needed for severe constipation.   prn at unknown   camphor-menthol [facility]) lotion Apply 1 application topically as needed for itching.   prn at unknown   carbamide peroxide (DEBROX) 6.5 % OTIC solution Place 5-10 drops into both ears every Friday.   prn at unknown   chlorhexidine (PERIDEX) 0.12 % solution Use as directed 5 mLs in the mouth or throat 2 (two) times daily.   prn at unknown   diphenhydrAMINE (BENADRYL) 25 mg capsule Take 25 mg by mouth every 6 (six) hours as needed.   prn at unknown   guaiFENesin (ROBITUSSIN) 100 MG/5ML SOLN Take 5 mLs by mouth every 4 (four) hours as needed for cough.   prn at unknown   hydrOXYzine (ATARAX/VISTARIL) 50 MG tablet Take 50 mg by mouth 3 (three) times daily as needed for itching or anxiety.   prn   latanoprost (XALATAN) 0.005 % ophthalmic solution Place 1 drop into the left eye at bedtime.   12/07/2020   levocetirizine (XYZAL) 5 MG tablet Take 5 mg by mouth at bedtime.    12/07/2020 at pm   loperamide (IMODIUM) 2 MG capsule Take 2-4 mg by mouth See admin instructions. Take 2 capsules (4mg ) by mouth after first loose stool then take 1 capsule (2mg ) by mouth after each additional loose stool (PRN)   prn at unknown   LORazepam (ATIVAN) 1 MG tablet Take 1 mg by mouth daily as needed for anxiety or sedation.   prn at unknown   metroNIDAZOLE (METROCREAM) 0.75 % cream Apply 1 application topically at bedtime. (Apply to face)   prn at unknown   naltrexone (DEPADE) 50 MG tablet Take 50 mg by mouth daily. (Patient not taking:  Reported on 12/09/2020)   Not Taking   neomycin-bacitracin-polymyxin (NEOSPORIN) ointment Apply 1 application topically daily as needed for wound care.   prn at unknown   OLANZapine (ZYPREXA) 10 MG tablet Take 10 mg by mouth at bedtime.      polyethylene glycol (MIRALAX / GLYCOLAX) 17 g packet Take 17 g by mouth daily.   prn at unknown   potassium chloride SA (KLOR-CON) 20 MEQ tablet Take 1 tablet (20 mEq total) by mouth daily. (Patient not taking: Reported on 12/09/2020) 4 tablet 0 Not Taking   tolnaftate (TINACTIN) 1 % cream Apply 1 application topically 2 (two) times daily as needed (itchy feet).  prn at unknown   traZODone (DESYREL) 50 MG tablet Take 50-125 mg by mouth 3 (three) times daily. Take one 50 mg tablet by mouth every morning, one 50 mg tablet at 2 PM, and two and one-half tablets 125 mg at bedtime   12/07/2020   Social History   Socioeconomic History   Marital status: Single    Spouse name: Not on file   Number of children: Not on file   Years of education: Not on file   Highest education level: Not on file  Occupational History   Not on file  Tobacco Use   Smoking status: Never   Smokeless tobacco: Never  Substance and Sexual Activity   Alcohol use: Never   Drug use: Never   Sexual activity: Not on file  Other Topics Concern   Not on file  Social History Narrative   Not on file   Social Determinants of Health   Financial Resource Strain: Not on file  Food Insecurity: Not on file  Transportation Needs: Not on file  Physical Activity: Not on file  Stress: Not on file  Social Connections: Not on file  Intimate Partner Violence: Not on file    History reviewed. No pertinent family history.    Review of systems complete and found to be negative unless listed above      PHYSICAL EXAM  General: Well developed, well nourished, in no acute distress HEENT:  Normocephalic and atramatic Neck:  No JVD.  Lungs: Clear bilaterally to auscultation and  percussion. Heart: HRRR . Normal S1 and S2 without gallops or murmurs.  Abdomen: Bowel sounds are positive, abdomen soft and non-tender  Msk:  Back normal, normal gait. Normal strength and tone for age. Extremities: No clubbing, cyanosis or edema.   Neuro: Alert and oriented X 3. Psych:  Good affect, responds appropriately  Labs:   Lab Results  Component Value Date   WBC 5.6 11/29/2020   HGB 13.3 11/10/2020   HCT 35.8 (L) 11/14/2020   MCV 87.3 11/16/2020   PLT 230 12/06/2020    Recent Labs  Lab 11/25/2020 1804 12/09/20 0135 12/09/20 1459  NA 122*   < > 122*  K 3.3*   < > 3.0*  CL 91*   < > 97*  CO2 24   < > 18*  BUN 14   < > 14  CREATININE 0.78   < > 0.75  CALCIUM 8.7*   < > 7.7*  PROT 7.4  --   --   BILITOT 0.7  --   --   ALKPHOS 84  --   --   ALT 15  --   --   AST 16  --   --   GLUCOSE 103*   < > 136*   < > = values in this interval not displayed.   Lab Results  Component Value Date   CKTOTAL 462 (H) 12/09/2020   TROPONINI <0.03 10/12/2015   No results found for: CHOL No results found for: HDL No results found for: LDLCALC No results found for: TRIG No results found for: CHOLHDL No results found for: LDLDIRECT    Radiology: DG Chest Portable 1 View  Result Date: 12/09/2020 CLINICAL DATA:  Worsened shortness of breath and hyponatremia EXAM: PORTABLE CHEST 1 VIEW COMPARISON:  Earlier today at 8:27 a.m. FINDINGS: AP portable radiograph. Numerous leads and wires project over the chest. Patient rotated minimally left. Midline trachea. Normal heart size. Atherosclerosis in the transverse aorta. No pleural effusion or  pneumothorax. Mild right hemidiaphragm elevation. No congestive failure. Low lung volumes with resultant pulmonary interstitial prominence. IMPRESSION: Cardiomegaly and low lung volumes.  No acute findings. Aortic Atherosclerosis (ICD10-I70.0). Electronically Signed   By: Jeronimo Greaves M.D.   On: 12/09/2020 14:54   DG Chest Port 1 View  Result Date:  12/09/2020 CLINICAL DATA:  65 year old male with fever and constipation. Intellectual disability. EXAM: PORTABLE CHEST 1 VIEW COMPARISON:  Portable chest 09/30/2020 and earlier. FINDINGS: Portable AP supine view at 0827 hours. Continued low but mildly improved lung volumes. Mediastinal contours are stable and within normal limits. Similar mild crowding of lung markings at the right hilum. But otherwise Allowing for portable technique the lungs are clear. Visualized tracheal air column is within normal limits. Left upper quadrant chronically ingested metallic wires appear mixed with retained stool in the colon at the splenic flexure as on the September CT. No acute osseous abnormality identified. IMPRESSION: Low but mildly improved lung volumes since 10/01/2018. No acute cardiopulmonary abnormality. Electronically Signed   By: Odessa Fleming M.D.   On: 12/09/2020 08:45   DG Abd Portable 1V  Result Date: 12/09/2020 CLINICAL DATA:  65 year old male with fever and constipation. Intellectual disability. EXAM: PORTABLE ABDOMEN - 1 VIEW COMPARISON:  CT Abdomen and Pelvis 09/30/2020 and earlier. FINDINGS: Portable AP supine view at 0829 hours. Nonobstructed bowel gas pattern, but there is a moderate to large volume of retained stool in the colon. This is similar to the September CT Abdomen and Pelvis. Other abdominal and pelvic visceral contours appear normal. No acute osseous abnormality identified. IMPRESSION: Moderate to large volume of retained stool in the colon, similar to September CT Abdomen and Pelvis. Electronically Signed   By: Odessa Fleming M.D.   On: 12/09/2020 08:43    EKG: Initial EKG: Sinus tachycardia with anterolateral ST depressions. . Follow-up EKG: Sinus tach at a rate of 102 with resolution of ST depressions.  ASSESSMENT AND PLAN:  Jakeem Grape is a 65 year old male with history of severe intellectual disability with difficult to control behaviors and agitation, epilepsy with generalized tonic-clonic  seizures, hypertension, chronic ingestion of nonedible products who was sent from his group home for elevated blood pressure and worsening agitation.  Cardiology is consulted because a troponin was checked and was elevated to 1100.  # Elevated troponin # Hypertensive urgency # Severe agitation # hyponatremia, SIADH # Acute hypoxic respiratory failure The patient is admitted with severely elevated blood pressure, agitation, and was also discovered to have a sodium of 122 likely due to SIADH.  He is also febrile today.  Receiving fluids for 24 hours and developed respiratory distress prompting a troponin to be checked which was elevated at 1100.  His initial EKG on presentation does show anterolateral ST depressions which certainly could suggest some underlying ischemia in the setting of elevated blood pressure.  His EKG is now normalized now that his blood pressure is improved.  Most likely his troponin elevation is related to demand in the setting of multiple systemic ongoing stressors.  We will treat conservatively especially because invasive procedures would be unsafe at this time given his mental status. -Recommend aspirin 324 mg now followed by 81 mg daily -Heparin infusion is reasonable, though would be preferable to have a CT of his head prior to giving anticoagulation -Continue treatment of his hypertension as you are doing. - Agree with lasix, we will see how he responds from a respiratory standpoint.  -Echocardiogram complete  -Trend troponin until peak -Monitor on telemetry -  Ischemic evaluation may be warranted going forward, but his agitation will need to improve.  Signed: Armando Reichert MD 12/09/2020, 5:00 PM

## 2020-12-09 NOTE — H&P (Signed)
History and Physical    Ryan Mayer DXA:[id number] DOB: 31-Oct-1955 DOA: 12/01/2020  PCP: Lauro Regulus, MD   Patient coming from: Group home  I have personally briefly reviewed patient's relevant medical records in [facility]  Chief Complaint: Elevated blood pressure, agitation  HPI: Ryan Mayer is a 65 y.o. male with medical history significant for Group home resident with severe intellectual disability, difficult to control behaviors including agitation, epilepsy with generalized tonic-clonic seizures, HTN, chronic constipation, chronic ingestion of nonedible products, hospitalized in September 2022 with hypotension with adjustment/discontinuation of multiple antihypertensives, who was sent to the ED because of uncontrolled blood pressures at his group home.  History is taken from ED provider and group home staff reported that patient has been more aggressive than his usual and has not been sleeping.  He has had no fever or chills, no cough or shortness of breath, no complaints of chest pain no abdominal pain and no vomiting or diarrhea  ED course: BP 165/115 on arrival with SBP max of 212.  Tachycardic to 126 Blood work significant for sodium of 122 at 1800, with no improvement after 6 hours.  He had mild hyperkalemia of 3.3 COVID and flu negative  Patient was very agitated in the ED and required several sedatives for sedation including droperidol, Ativan and then ketamine.  Hospitalist consulted for admission.   Review of Systems: Unable to obtain as patient currently sedated  Assessment/Plan    Acute-hyponatremia -Uncertain etiology, Did not improve after 6 hours following a normal saline fluid bolus in the ED - Follow serum and urine osmolality and urine sodium - Uncertain whether hyponatremia is contributing to agitation - Neurologic checks    Hypertensive urgency - Hydralazine as needed  Acute agitation Intellectual disability and baseline behavioral  problems - Patient sedated from the ED with droperidol, Ativan and ketamine - Aspiration precautions - We will monitor in stepdown overnight - To resume psych meds when more awake    Hypokalemia - IV replacement    Epilepsy (HCC) -Continue antiepileptics once sedation wears off   DVT prophylaxis: Lovenox  Code Status: full code  Family Communication:  none  Disposition Plan: Back to previous home environment Consults called: none  Status:At the time of admission, it appears that the appropriate admission status for this patient is INPATIENT. This is judged to be reasonable and necessary in order to provide the required intensity of service to ensure the patient's safety given the presenting symptoms, physical exam findings, and initial radiographic and laboratory data in the context of their  Comorbid conditions.   Patient requires inpatient status due to high intensity of service, high risk for further deterioration and high frequency of surveillance required.   I certify that at the point of admission it is my clinical judgment that the patient will require inpatient hospital care spanning beyond 2 midnights     Physical Exam: Vitals:   12/09/20 0200 12/09/20 0208 12/09/20 0230 12/09/20 0300  BP: (!) 192/87 (!) 139/116 (!) 175/111 (!) 185/89  Pulse: (!) 127 (!) 111 (!) 122 (!) 126  Resp:    18  Temp:      TempSrc:      SpO2:  94%  98%  Weight:      Height:       Constitutional: sedated  Not in any apparent distress HEENT:      Head: Normocephalic and atraumatic.         Eyes: PERLA, EOMI, Conjunctivae are normal.  Sclera is non-icteric.       Mouth/Throat: Mucous membranes are moist.       Neck: Supple with no signs of meningismus. Cardiovascular: Regular rate and rhythm. No murmurs, gallops, or rubs. 2+ symmetrical distal pulses are present . No JVD. No  LE edema Respiratory: Respiratory effort normal .Lungs sounds clear bilaterally. No wheezes, crackles, or rhonchi.   Gastrointestinal: Soft, non tender, non distended. Positive bowel sounds.  Genitourinary: No CVA tenderness. Musculoskeletal: Nontender with normal range of motion in all extremities. No cyanosis, or erythema of extremities. Neurologic:  Face is symmetric. Moving all extremities. No gross focal neurologic deficits . Skin: Skin is warm, dry.  No rash or ulcers Psychiatric: unable to assess as patient received sedation    Past Medical History:  Diagnosis Date   Hypertension    Intellectual disability    Seizure (HCC)     History reviewed. No pertinent surgical history.   reports that he has never smoked. He has never used smokeless tobacco. He reports that he does not drink alcohol and does not use drugs.  No Known Allergies  History reviewed. No pertinent family history.    Prior to Admission medications   Medication Sig Start Date End Date Taking? Authorizing Provider  acetaminophen (TYLENOL) 325 MG tablet Take 650 mg by mouth every 4 (four) hours as needed for fever.    [provider]  amLODipine (NORVASC) 5 MG tablet Take 5 mg by mouth daily. 09/10/19   [provider]  aspirin EC 81 MG tablet Take 81 mg by mouth daily. Swallow whole.    [provider]  atropine 1 % ophthalmic solution Place 1 drop into the left eye 6 (six) times daily.    [provider]  bisacodyl (DULCOLAX) 5 MG EC tablet Take 10 mg by mouth every 12 (twelve) hours as needed for severe constipation.    [provider]  brimonidine (ALPHAGAN) 0.2 % ophthalmic solution Place 1 drop into the left eye 3 (three) times daily.    [provider]  camphor-menthol Wynelle Fanny) lotion Apply 1 application topically as needed for itching.    [provider]  carbamazepine (TEGRETOL) 200 MG tablet Take 200 mg by mouth 3 (three) times daily. 09/10/19   [provider]  carbamide peroxide (DEBROX) 6.5 % OTIC solution Place 5-10 drops into both ears every  Friday.    [provider]  carvedilol (COREG) 25 MG tablet Take 25 mg by mouth 2 (two) times daily with a meal.    [provider]  cetirizine (ZYRTEC) 10 MG tablet Take 10 mg by mouth 2 (two) times daily.    [provider]  chlorhexidine (PERIDEX) 0.12 % solution Use as directed 5 mLs in the mouth or throat 2 (two) times daily.    [provider]  diphenhydrAMINE (BENADRYL) 25 mg capsule Take 25 mg by mouth every 6 (six) hours as needed.    [provider]  docusate sodium (COLACE) 100 MG capsule Take 100 mg by mouth 3 (three) times daily as needed for mild constipation or moderate constipation.    [provider]  dorzolamide-timolol (COSOPT) 22.3-6.8 MG/ML ophthalmic solution Place 1 drop into the left eye 2 (two) times daily.    [provider]  Emollient (CETAPHIL) cream Apply 1 application topically as directed. Apply after bath    [provider]  escitalopram (LEXAPRO) 10 MG tablet Take 10 mg by mouth daily. 09/10/19   [provider]  fesoterodine (TOVIAZ) 8 MG TB24 tablet Take 8 mg by mouth daily.    [provider]  fluticasone (FLONASE) 50 MCG/ACT nasal spray Place 1 spray into both nostrils 2 (two) times daily. 09/19/19   [provider]  guaiFENesin (ROBITUSSIN) 100 MG/5ML SOLN Take 5 mLs by mouth every 4 (four) hours as needed for cough.    [provider]  hydrOXYzine (ATARAX/VISTARIL) 50 MG tablet Take 50 mg by mouth 3 (three) times daily as needed for itching or anxiety.    [provider]  latanoprost (XALATAN) 0.005 % ophthalmic solution Place 1 drop into the left eye at bedtime.    [provider]  levocetirizine (XYZAL) 5 MG tablet Take 5 mg by mouth at bedtime.  09/10/19   [provider]  loperamide (IMODIUM) 2 MG capsule Take 2-4 mg by mouth See admin instructions. Take 2 capsules ( ) by mouth after first loose stool then take 1 capsule ( )  by mouth after each additional loose stool (PRN)    [provider]  LORazepam (ATIVAN) 1 MG tablet Take 1 mg by mouth daily as needed for anxiety or sedation.    [provider]  LORazepam (ATIVAN) 1 MG tablet Take 1 mg by mouth in the morning and at bedtime. (1700 & 2000)    [provider]  metroNIDAZOLE (METROCREAM) 0.75 % cream Apply 1 application topically at bedtime. (Apply to face)    [provider]  naltrexone (DEPADE) 50 MG tablet Take 50 mg by mouth daily.    [provider]  neomycin-bacitracin-polymyxin (NEOSPORIN) ointment Apply 1 application topically daily as needed for wound care.    [provider]  pantoprazole (PROTONIX) 40 MG tablet Take 40 mg by mouth daily. 09/10/19   [provider]  polyethylene glycol (MIRALAX / GLYCOLAX) 17 g packet Take 17 g by mouth daily.    [provider]  potassium chloride SA (KLOR-CON) 20 MEQ tablet Take 1 tablet (20 mEq total) by mouth daily. 10/02/20   Danford, Earl Lites, MD  prednisoLONE acetate (PRED FORTE) 1 % ophthalmic suspension Place 1 drop into the left eye 6 (six) times daily.    [provider]  risperiDONE (RISPERDAL) 0.5 MG tablet Take 1 mg by mouth See admin instructions. Take 2 tablets (1 mg) by mouth every morning and 2 tablets (1 mg) at bedtime    [provider]  tolnaftate (TINACTIN) 1 % cream Apply 1 application topically 2 (two) times daily as needed (itchy feet).    [provider]  traZODone (DESYREL) 50 MG tablet Take 50-125 mg by mouth 3 (three) times daily. Take one 50 mg tablet by mouth every morning, one 50 mg tablet at 2 PM, and two and one-half tablets 125 mg at bedtime    [provider]      Labs on Admission: I have personally reviewed following labs and imaging studies  CBC: Recent Labs  Lab 12-31-20 1804  WBC 5.6  NEUTROABS 3.3  HGB 13.3  HCT 35.8*  MCV 87.3  PLT 230   Basic Metabolic  Panel: Recent Labs  Lab 12/31/20 1804 12/09/20 0135  NA 122* 121*  K 3.3* 3.2*  CL 91* 93*  CO2 24 21*  GLUCOSE 103* 143*  BUN 14 14  CREATININE 0.78 0.96  CALCIUM 8.7* 8.5*  MG 1.9  --    GFR: Estimated Creatinine Clearance: 64.6 mL/min (by C-G formula based on SCr of 0.96 mg/dL). Liver Function Tests: Recent Labs  Lab 2021-01-06 1804  AST 16  ALT 15  ALKPHOS 84  BILITOT 0.7  PROT 7.4  ALBUMIN 4.0   No results for input(s): LIPASE, AMYLASE in the last 168 hours. No results for input(s): AMMONIA in the last 168 hours. Coagulation Profile: No results for input(s): INR, PROTIME in the last 168 hours. Cardiac Enzymes: No results for input(s): CKTOTAL, CKMB, CKMBINDEX, TROPONINI in the last 168 hours. BNP (last 3 results) No results for input(s): PROBNP in the last 8760 hours. HbA1C: No results for input(s): HGBA1C in the last 72 hours. CBG: No results for input(s): GLUCAP in the last 168 hours. Lipid Profile: No results for input(s): CHOL, HDL, LDLCALC, TRIG, CHOLHDL, LDLDIRECT in the last 72 hours. Thyroid Function Tests: No results for input(s): TSH, T4TOTAL, FREET4, T3FREE, THYROIDAB in the last 72 hours. Anemia Panel: No results for input(s): VITAMINB12, FOLATE, FERRITIN, TIBC, IRON, RETICCTPCT in the last 72 hours. Urine analysis:    Component Value Date/Time   COLORURINE YELLOW 12/09/2020 0027   APPEARANCEUR CLEAR 12/09/2020 0027   LABSPEC 1.015 12/09/2020 0027   PHURINE 7.0 12/09/2020 0027   GLUCOSEU NEGATIVE 12/09/2020 0027   HGBUR NEGATIVE 12/09/2020 0027   BILIRUBINUR NEGATIVE 12/09/2020 0027   KETONESUR NEGATIVE 12/09/2020 0027   PROTEINUR NEGATIVE 12/09/2020 0027   NITRITE NEGATIVE 12/09/2020 0027   LEUKOCYTESUR NEGATIVE 12/09/2020 0027    Radiological Exams on Admission: No results found.     Andris Baumann MD [facility]   12/09/2020, 3:08 AM

## 2020-12-09 NOTE — ED Notes (Signed)
MD aware of pt desating to 88% on RA and pt breathing worsened.

## 2020-12-09 NOTE — ED Notes (Signed)
Patient is resting comfortably. 

## 2020-12-09 NOTE — ED Notes (Signed)
Adult brief changed (incontinent to urine)

## 2020-12-09 NOTE — ED Notes (Signed)
This ED tech fed this patient. Pt ate 75% of his spaghetti and 100% of applesauce. Pt also drank 100% of tea. Pt face washed after meal.

## 2020-12-09 NOTE — ED Notes (Signed)
Pt restless and agitated. RN @ bedside.

## 2020-12-09 NOTE — Progress Notes (Signed)
ANTICOAGULATION CONSULT NOTE  Pharmacy Consult for heparin infusion Indication: chest pain/ACS  No Known Allergies  Patient Measurements: Height: 5' (152.4 cm) Weight: 70 kg (154 lb 5.2 oz) IBW/kg (Calculated) : 50 Heparin Dosing Weight: 64.7 kg  Vital Signs: Temp: 100.9 F (38.3 C) (11/30 1459) Temp Source: Rectal (11/30 1459) BP: 103/81 (11/30 1459) Pulse Rate: 98 (11/30 1459)  Labs: Recent Labs    11/25/2020 1804 12/09/20 0135 12/09/20 1459 12/09/20 1516  HGB 13.3  --   --   --   HCT 35.8*  --   --   --   PLT 230  --   --   --   CREATININE 0.78 0.96 0.75  --   CKTOTAL  --   --   --  462*  TROPONINIHS  --   --  1,162*  --     Estimated Creatinine Clearance: 77.5 mL/min (by C-G formula based on SCr of 0.75 mg/dL).   Medical History: Past Medical History:  Diagnosis Date   Hypertension    Intellectual disability    Seizure (HCC)     Medications:  Scheduled:   amLODipine  5 mg Oral Daily   aspirin  324 mg Oral Once   aspirin EC  81 mg Oral Daily   atropine  1 drop Left Eye 6 X Daily   bisacodyl  10 mg Oral Daily   Or   bisacodyl  10 mg Rectal Daily   brimonidine  1 drop Left Eye TID   carbamazepine  200 mg Oral TID   cloNIDine  0.1 mg Oral TID   dorzolamide-timolol  1 drop Left Eye BID   fesoterodine  8 mg Oral Daily   heparin  4,000 Units Intravenous Once   latanoprost  1 drop Left Eye QHS   loratadine  10 mg Oral Daily   LORazepam  1 mg Oral QHS   methylPREDNISolone (SOLU-MEDROL) injection  40 mg Intravenous Once   metoprolol tartrate  2.5 mg Intravenous Q6H   nitroGLYCERIN  0.5 inch Topical Q6H   OLANZapine  10 mg Oral QHS   pantoprazole  40 mg Oral Daily   polyethylene glycol  17 g Oral BID   traZODone  100 mg Oral QHS    Assessment: 57 YOM w/ PMH of intellectual disability, agitation, epilepsy, HTN, chronic constipation, ingestion of nonedible products and hypertension sent to ED from group home with uncontrolled blood pressure and admitted  with acute hyponatremia and agitation and NSTEMI. According to the med rec he is not on any chronic anticoagulation prior to admission.  Goal of Therapy:  Heparin level 0.3-0.7 units/ml Monitor platelets by anticoagulation protocol: Yes   Plan:  Give 4000 units bolus x 1 Start heparin infusion at 900 units/hr Check anti-Xa level in 6 hours and daily while on heparin Continue to monitor H&H and platelets  Lowella Bandy 12/09/2020,4:09 PM

## 2020-12-09 NOTE — ED Notes (Addendum)
VO/ RB 2.5 mg Droperidol IVP by MD Jessup/ administered by RN Huntley Dec

## 2020-12-09 NOTE — Consult Note (Signed)
  Consult for patient was in ED and is now on medical unit (ICU). Patient had been agitated and attending asked for psych recommendations. Patient was acutely ill with O2 sat at 88% at the time writer went to see him for evaluation. Patient is non-verbal at baseline. He has had several prns since arrival for agitation. Patient was moved to ICU and intubated.

## 2020-12-10 ENCOUNTER — Inpatient Hospital Stay: Payer: Medicare Other

## 2020-12-10 ENCOUNTER — Inpatient Hospital Stay
Admit: 2020-12-10 | Discharge: 2020-12-10 | Disposition: A | Discharge status: Home / Self Care | Payer: Medicare Other | Attending: Student | Admitting: Student

## 2020-12-10 DIAGNOSIS — E871 Hypo-osmolality and hyponatremia: Secondary | ICD-10-CM | POA: Diagnosis not present

## 2020-12-10 LAB — CBC
HCT: 26.8 % — ABNORMAL LOW (ref 39.0–52.0)
Hemoglobin: 9.9 g/dL — ABNORMAL LOW (ref 13.0–17.0)
MCH: 31.8 pg (ref 26.0–34.0)
MCHC: 36.9 g/dL — ABNORMAL HIGH (ref 30.0–36.0)
MCV: 86.2 fL (ref 80.0–100.0)
Platelets: 198 10*3/uL (ref 150–400)
RBC: 3.11 MIL/uL — ABNORMAL LOW (ref 4.22–5.81)
RDW: 11.9 % (ref 11.5–15.5)
WBC: 8.1 10*3/uL (ref 4.0–10.5)
nRBC: 0 % (ref 0.0–0.2)

## 2020-12-10 LAB — CK: Total CK: 2632 U/L — ABNORMAL HIGH (ref 49–397)

## 2020-12-10 LAB — RENAL FUNCTION PANEL
Albumin: 2.9 g/dL — ABNORMAL LOW (ref 3.5–5.0)
Anion gap: 7 (ref 5–15)
BUN: 13 mg/dL (ref 8–23)
CO2: 22 mmol/L (ref 22–32)
Calcium: 7.6 mg/dL — ABNORMAL LOW (ref 8.9–10.3)
Chloride: 98 mmol/L (ref 98–111)
Creatinine, Ser: 0.75 mg/dL (ref 0.61–1.24)
GFR, Estimated: >60 mL/min (ref >60)
Glucose, Bld: 126 mg/dL — ABNORMAL HIGH (ref 70–99)
Phosphorus: 3.1 mg/dL (ref 2.5–4.6)
Potassium: 2.8 mmol/L — ABNORMAL LOW (ref 3.5–5.1)
Sodium: 127 mmol/L — ABNORMAL LOW (ref 135–145)

## 2020-12-10 LAB — GLUCOSE, CAPILLARY
Glucose-Capillary: 119 mg/dL — ABNORMAL HIGH (ref 70–99)
Glucose-Capillary: 128 mg/dL — ABNORMAL HIGH (ref 70–99)
Glucose-Capillary: 128 mg/dL — ABNORMAL HIGH (ref 70–99)
Glucose-Capillary: 138 mg/dL — ABNORMAL HIGH (ref 70–99)
Glucose-Capillary: 140 mg/dL — ABNORMAL HIGH (ref 70–99)
Glucose-Capillary: 148 mg/dL — ABNORMAL HIGH (ref 70–99)

## 2020-12-10 LAB — PHOSPHORUS
Phosphorus: 3.2 mg/dL (ref 2.5–4.6)
Phosphorus: 2.9 mg/dL (ref 2.5–4.6)

## 2020-12-10 LAB — MAGNESIUM: Magnesium: 2.2 mg/dL (ref 1.7–2.4)

## 2020-12-10 LAB — HEPARIN LEVEL (UNFRACTIONATED)
Heparin Unfractionated: 0.53 IU/mL (ref 0.30–0.70)
Heparin Unfractionated: 0.3 IU/mL (ref 0.30–0.70)

## 2020-12-10 LAB — SODIUM
Sodium: 126 mmol/L — ABNORMAL LOW (ref 135–145)
Sodium: 129 mmol/L — ABNORMAL LOW (ref 135–145)

## 2020-12-10 LAB — TRIGLYCERIDES: Triglycerides: 200 mg/dL — ABNORMAL HIGH (ref <150)

## 2020-12-10 MED ORDER — TRAZODONE HCL 100 MG PO TABS
100.0000 mg | ORAL_TABLET | Freq: Every day | ORAL | Status: DC
  Administered 2020-12-10 – 2020-12-16 (×7): 100 mg
  Filled 2020-12-10 (×7): qty 1

## 2020-12-10 MED ORDER — LORAZEPAM 1 MG PO TABS
1.0000 mg | ORAL_TABLET | Freq: Every day | ORAL | Status: DC
  Administered 2020-12-10 – 2020-12-13 (×4): 1 mg
  Filled 2020-12-10 (×4): qty 1

## 2020-12-10 MED ORDER — CARBAMAZEPINE 200 MG PO TABS
200.0000 mg | ORAL_TABLET | Freq: Three times a day (TID) | ORAL | Status: DC
  Administered 2020-12-10 – 2020-12-16 (×21): 200 mg
  Filled 2020-12-10 (×24): qty 1

## 2020-12-10 MED ORDER — POTASSIUM CHLORIDE 20 MEQ PO PACK
20.0000 meq | PACK | ORAL | Status: AC
  Administered 2020-12-10 – 2020-12-11 (×3): 20 meq
  Filled 2020-12-10 (×3): qty 1

## 2020-12-10 MED ORDER — CLONIDINE HCL 0.1 MG PO TABS
0.1000 mg | ORAL_TABLET | Freq: Three times a day (TID) | ORAL | Status: DC
  Filled 2020-12-10 (×3): qty 1

## 2020-12-10 MED ORDER — AMLODIPINE BESYLATE 5 MG PO TABS
5.0000 mg | ORAL_TABLET | Freq: Every day | ORAL | Status: DC
  Filled 2020-12-10: qty 1

## 2020-12-10 MED ORDER — OLANZAPINE 10 MG PO TABS
10.0000 mg | ORAL_TABLET | Freq: Every day | ORAL | Status: DC
  Administered 2020-12-10 – 2020-12-16 (×7): 10 mg
  Filled 2020-12-10 (×8): qty 1

## 2020-12-10 MED ORDER — ASPIRIN 81 MG PO CHEW
81.0000 mg | CHEWABLE_TABLET | Freq: Every day | ORAL | Status: DC
  Administered 2020-12-10 – 2020-12-16 (×7): 81 mg
  Filled 2020-12-10 (×7): qty 1

## 2020-12-10 MED ORDER — FREE WATER
30.0000 mL | Status: DC
  Administered 2020-12-10 – 2020-12-17 (×40): 30 mL

## 2020-12-10 MED ORDER — VITAL HIGH PROTEIN PO LIQD
1000.0000 mL | ORAL | Status: DC
  Administered 2020-12-10 (×2): 1000 mL

## 2020-12-10 MED ORDER — LORATADINE 10 MG PO TABS
10.0000 mg | ORAL_TABLET | Freq: Every day | ORAL | Status: DC
  Administered 2020-12-10 – 2020-12-16 (×7): 10 mg
  Filled 2020-12-10 (×7): qty 1

## 2020-12-10 MED ORDER — FESOTERODINE FUMARATE ER 8 MG PO TB24
8.0000 mg | ORAL_TABLET | Freq: Every day | ORAL | Status: DC
  Administered 2020-12-14 – 2020-12-16 (×3): 8 mg via ORAL
  Filled 2020-12-10 (×4): qty 1

## 2020-12-10 MED ORDER — PROSOURCE TF PO LIQD
45.0000 mL | Freq: Every day | ORAL | Status: DC
  Administered 2020-12-10: 45 mL
  Filled 2020-12-10 (×2): qty 45

## 2020-12-10 NOTE — Progress Notes (Signed)
[facility] Cardiology  CARDIOLOGY CONSULT NOTE  Patient ID: Ryan Mayer MRN: [id number] DOB/AGE: 1955-02-12 65 y.o.  Admit date: 11/15/2020 Referring Physician Candelaria Stagers Primary Physician Lauro Regulus, MD Primary Cardiologist NA Reason for Consultation Elevated troponin  HPI:  Ryan Mayer is a 65 year old male with history of severe intellectual disability with difficult to control behaviors and agitation, epilepsy with generalized tonic-clonic seizures, hypertension, chronic ingestion of nonedible products who was sent from his group home for elevated blood pressure and worsening agitation.  Cardiology is consulted because a troponin was checked and was elevated to 1100, decreasing therafter.  Interval history - Intubated overnight.  - HR and BP stable since being intubated.  - Unable to provide history.   Review of systems complete and found to be negative unless listed above     Past Medical History:  Diagnosis Date   Hypertension    Intellectual disability    Seizure (HCC)     History reviewed. No pertinent surgical history.  Medications Prior to Admission  Medication Sig Dispense Refill Last Dose   acetaZOLAMIDE ER (DIAMOX) 500 MG capsule Take 500 mg by mouth every other day.   Past Week   amLODipine (NORVASC) 5 MG tablet Take 5 mg by mouth daily.   11/15/2020   aspirin EC 81 MG tablet Take 81 mg by mouth daily. Swallow whole.   11/28/2020   atropine 1 % ophthalmic solution Place 1 drop into the left eye 6 (six) times daily.   11/27/2020   brimonidine (ALPHAGAN) 0.2 % ophthalmic solution Place 1 drop into the left eye 3 (three) times daily.   12/09/2020   carbamazepine (TEGRETOL) 200 MG tablet Take 200 mg by mouth 3 (three) times daily.   11/21/2020   carvedilol (COREG) 25 MG tablet Take 25 mg by mouth 2 (two) times daily with a meal.   11/19/2020   cetirizine (ZYRTEC) 10 MG tablet Take 10 mg by mouth 2 (two) times daily.   11/21/2020   cloNIDine (CATAPRES) 0.1 MG  tablet Take 0.1 mg by mouth 3 (three) times daily. 0700, NOON, 1800   12/09/2020 at 1200   docusate sodium (COLACE) 100 MG capsule Take 100 mg by mouth 3 (three) times daily as needed for mild constipation or moderate constipation.   11/12/2020   dorzolamide-timolol (COSOPT) 22.3-6.8 MG/ML ophthalmic solution Place 1 drop into the left eye 2 (two) times daily.   12/03/2020   Emollient (CETAPHIL) cream Apply 1 application topically as directed. Apply after bath   12/07/2020   escitalopram (LEXAPRO) 10 MG tablet Take 10 mg by mouth daily.   11/10/2020   fesoterodine (TOVIAZ) 8 MG TB24 tablet Take 8 mg by mouth daily.   11/23/2020   fluticasone (FLONASE) 50 MCG/ACT nasal spray Place 1 spray into both nostrils 2 (two) times daily.   11/24/2020   lisinopril (ZESTRIL) 20 MG tablet Take 20 mg by mouth daily.   11/12/2020 at am   lisinopril-hydrochlorothiazide (ZESTORETIC) 20-12.5 MG tablet Take 1 tablet by mouth daily.   11/17/2020 at am   LORazepam (ATIVAN) 1 MG tablet Take 1 mg by mouth in the morning and at bedtime. (1700 & 2000)   11/14/2020   pantoprazole (PROTONIX) 40 MG tablet Take 40 mg by mouth daily.   11/13/2020 at am   prednisoLONE acetate (PRED FORTE) 1 % ophthalmic suspension Place 1 drop into the left eye 6 (six) times daily.   12/01/2020   risperiDONE (RISPERDAL) 0.5 MG tablet Take 1 mg by mouth  See admin instructions. Take 2 tablets (1 mg) by mouth every morning and 2 tablets (1 mg) at bedtime   January 06, 2021   acetaminophen (TYLENOL) 325 MG tablet Take 650 mg by mouth every 4 (four) hours as needed for fever.   prn at unknown   bisacodyl (DULCOLAX) 5 MG EC tablet Take 10 mg by mouth every 12 (twelve) hours as needed for severe constipation.   prn at unknown   camphor-menthol [facility]) lotion Apply 1 application topically as needed for itching.   prn at unknown   carbamide peroxide (DEBROX) 6.5 % OTIC solution Place 5-10 drops into both ears every Friday.   prn at unknown   chlorhexidine  (PERIDEX) 0.12 % solution Use as directed 5 mLs in the mouth or throat 2 (two) times daily.   prn at unknown   diphenhydrAMINE (BENADRYL) 25 mg capsule Take 25 mg by mouth every 6 (six) hours as needed.   prn at unknown   guaiFENesin (ROBITUSSIN) 100 MG/5ML SOLN Take 5 mLs by mouth every 4 (four) hours as needed for cough.   prn at unknown   hydrOXYzine (ATARAX/VISTARIL) 50 MG tablet Take 50 mg by mouth 3 (three) times daily as needed for itching or anxiety.   prn   latanoprost (XALATAN) 0.005 % ophthalmic solution Place 1 drop into the left eye at bedtime.   12/07/2020   levocetirizine (XYZAL) 5 MG tablet Take 5 mg by mouth at bedtime.    12/07/2020 at pm   loperamide (IMODIUM) 2 MG capsule Take 2-4 mg by mouth See admin instructions. Take 2 capsules ( ) by mouth after first loose stool then take 1 capsule ( ) by mouth after each additional loose stool (PRN)   prn at unknown   LORazepam (ATIVAN) 1 MG tablet Take 1 mg by mouth daily as needed for anxiety or sedation.   prn at unknown   metroNIDAZOLE (METROCREAM) 0.75 % cream Apply 1 application topically at bedtime. (Apply to face)   prn at unknown   naltrexone (DEPADE) 50 MG tablet Take 50 mg by mouth daily. (Patient not taking: Reported on 12/09/2020)   Not Taking   neomycin-bacitracin-polymyxin (NEOSPORIN) ointment Apply 1 application topically daily as needed for wound care.   prn at unknown   OLANZapine (ZYPREXA) 10 MG tablet Take 10 mg by mouth at bedtime.      polyethylene glycol (MIRALAX / GLYCOLAX) 17 g packet Take 17 g by mouth daily.   prn at unknown   potassium chloride SA (KLOR-CON) 20 MEQ tablet Take 1 tablet (20 mEq total) by mouth daily. (Patient not taking: Reported on 12/09/2020) 4 tablet 0 Not Taking   tolnaftate (TINACTIN) 1 % cream Apply 1 application topically 2 (two) times daily as needed (itchy feet).   prn at unknown   traZODone (DESYREL) 50 MG tablet Take 50-125 mg by mouth 3 (three) times daily. Take one 50 mg tablet by  mouth every morning, one 50 mg tablet at 2 PM, and two and one-half tablets 125 mg at bedtime   12/07/2020   Social History   Socioeconomic History   Marital status: Single    Spouse name: Not on file   Number of children: Not on file   Years of education: Not on file   Highest education level: Not on file  Occupational History   Not on file  Tobacco Use   Smoking status: Never   Smokeless tobacco: Never  Substance and Sexual Activity   Alcohol use: Never   Drug  use: Never   Sexual activity: Not on file  Other Topics Concern   Not on file  Social History Narrative   Not on file   Social Determinants of Health   Financial Resource Strain: Not on file  Food Insecurity: Not on file  Transportation Needs: Not on file  Physical Activity: Not on file  Stress: Not on file  Social Connections: Not on file  Intimate Partner Violence: Not on file    History reviewed. No pertinent family history.    Review of systems complete and found to be negative unless listed above    PHYSICAL EXAM  General: Well developed, well nourished, in no acute distress HEENT:  Normocephalic and atramatic Neck:  No JVD.  Lungs: Clear bilaterally to auscultation and percussion. Heart: HRRR . Normal S1 and S2 without gallops or murmurs.  Abdomen: Bowel sounds are positive, abdomen soft and non-tender  Msk:  Back normal, normal gait. Normal strength and tone for age. Extremities: No clubbing, cyanosis or edema.   Neuro: Alert and oriented X 3. Psych:  Good affect, responds appropriately  Labs:   Lab Results  Component Value Date   WBC 5.6 2020/12/13   HGB 13.3 December 13, 2020   HCT 35.8 (L) 12-13-2020   MCV 87.3 13-Dec-2020   PLT 230 12/13/2020    Recent Labs  Lab 12-13-2020 1804 12/09/20 0135 12/09/20 1459 12/09/20 2247  NA 122*   < > 122* 123*  K 3.3*   < > 3.0*  --   CL 91*   < > 97*  --   CO2 24   < > 18*  --   BUN 14   < > 14  --   CREATININE 0.78   < > 0.75  --   CALCIUM 8.7*   <  > 7.7*  --   PROT 7.4  --   --   --   BILITOT 0.7  --   --   --   ALKPHOS 84  --   --   --   ALT 15  --   --   --   AST 16  --   --   --   GLUCOSE 103*   < > 136*  --    < > = values in this interval not displayed.    Lab Results  Component Value Date   CKTOTAL 462 (H) 12/09/2020   TROPONINI <0.03 10/12/2015   No results found for: CHOL No results found for: HDL No results found for: LDLCALC No results found for: TRIG No results found for: CHOLHDL No results found for: LDLDIRECT    Radiology: DG Abd 1 View  Result Date: 12/09/2020 CLINICAL DATA:  OG tube EXAM: ABDOMEN - 1 VIEW COMPARISON:  12/09/2020, CT 09/30/2020 FINDINGS: Esophageal tube tip overlies the mid stomach. Tubing is folded back upon itself in the proximal stomach. Linear metallic opacities over the stomach are again noted. Bowel gas pattern unobstructed with moderate stool. IMPRESSION: 1. Esophageal tube folded back upon itself over proximal stomach, tip overlies the mid gastric region 2. Multiple linear metallic foreign bodies over the stomach as before Electronically Signed   By: Jasmine Pang M.D.   On: 12/09/2020 20:16   DG Chest Port 1 View  Result Date: 12/09/2020 CLINICAL DATA:  OG tube placement EXAM: PORTABLE CHEST 1 VIEW COMPARISON:  Radiograph 12/09/2020 FINDINGS: Endotracheal tube tip overlies the right mainstem bronchus, which should be retracted by approximately 4.0 cm. Orogastric tube side port overlies the  distal esophagus, could be advanced by 7.0 cm. Unchanged cardiomediastinal silhouette. There is no new airspace disease. There is no large pleural effusion. No pneumothorax. Osseous structures are unchanged. Unchanged linear metallic densities overlying the stomach. IMPRESSION: Endotracheal tube tip overlies the right mainstem bronchus, recommend retraction by 4.0 cm. Orogastric tube side port overlies the distal esophagus, recommend advancement by 7.0 cm. No new airspace disease. Critical Value/emergent  results were called by telephone at the time of interpretation on 12/09/2020 at 6:47 pm to provider taking care of the patient, who verbally acknowledged these results. Electronically Signed   By: Caprice Renshaw M.D.   On: 12/09/2020 18:59   DG Chest Portable 1 View  Result Date: 12/09/2020 CLINICAL DATA:  Worsened shortness of breath and hyponatremia EXAM: PORTABLE CHEST 1 VIEW COMPARISON:  Earlier today at 8:27 a.m. FINDINGS: AP portable radiograph. Numerous leads and wires project over the chest. Patient rotated minimally left. Midline trachea. Normal heart size. Atherosclerosis in the transverse aorta. No pleural effusion or pneumothorax. Mild right hemidiaphragm elevation. No congestive failure. Low lung volumes with resultant pulmonary interstitial prominence. IMPRESSION: Cardiomegaly and low lung volumes.  No acute findings. Aortic Atherosclerosis (ICD10-I70.0). Electronically Signed   By: Jeronimo Greaves M.D.   On: 12/09/2020 14:54   DG Chest Port 1 View  Result Date: 12/09/2020 CLINICAL DATA:  65 year old male with fever and constipation. Intellectual disability. EXAM: PORTABLE CHEST 1 VIEW COMPARISON:  Portable chest 09/30/2020 and earlier. FINDINGS: Portable AP supine view at 0827 hours. Continued low but mildly improved lung volumes. Mediastinal contours are stable and within normal limits. Similar mild crowding of lung markings at the right hilum. But otherwise Allowing for portable technique the lungs are clear. Visualized tracheal air column is within normal limits. Left upper quadrant chronically ingested metallic wires appear mixed with retained stool in the colon at the splenic flexure as on the September CT. No acute osseous abnormality identified. IMPRESSION: Low but mildly improved lung volumes since 10/01/2018. No acute cardiopulmonary abnormality. Electronically Signed   By: Odessa Fleming M.D.   On: 12/09/2020 08:45   DG Abd Portable 1V  Result Date: 12/09/2020 CLINICAL DATA:  65 year old  male with fever and constipation. Intellectual disability. EXAM: PORTABLE ABDOMEN - 1 VIEW COMPARISON:  CT Abdomen and Pelvis 09/30/2020 and earlier. FINDINGS: Portable AP supine view at 0829 hours. Nonobstructed bowel gas pattern, but there is a moderate to large volume of retained stool in the colon. This is similar to the September CT Abdomen and Pelvis. Other abdominal and pelvic visceral contours appear normal. No acute osseous abnormality identified. IMPRESSION: Moderate to large volume of retained stool in the colon, similar to September CT Abdomen and Pelvis. Electronically Signed   By: Odessa Fleming M.D.   On: 12/09/2020 08:43    EKG: Initial EKG: Sinus tachycardia with anterolateral ST depressions. . Follow-up EKG: Sinus tach at a rate of 102 with resolution of ST depressions.  ASSESSMENT AND PLAN:  Shanna Strength is a 65 year old male with history of severe intellectual disability with difficult to control behaviors and agitation, epilepsy with generalized tonic-clonic seizures, hypertension, chronic ingestion of nonedible products who was sent from his group home for elevated blood pressure and worsening agitation.  Cardiology is consulted because a troponin was checked and was elevated to 1100, decreasing therafter.  # Elevated troponin # Hypertensive urgency # Severe agitation # hyponatremia, SIADH # Acute hypoxic respiratory failure The patient is admitted with severely elevated blood pressure, agitation, and was also discovered to  have a sodium of 122 likely due to SIADH.  He is also febrile.  Receiving fluids for 24 hours and developed respiratory distress prompting a troponin to be checked which was elevated at 1100 and decreasing thereafter.  His initial EKG on presentation does show anterolateral ST depressions which certainly could suggest some underlying ischemia in the setting of elevated blood pressure.  His EKG is normalized with improvement in blood pressure.  Most likely his troponin  elevation is related to demand in the setting of multiple systemic ongoing stressors.  We will treat conservatively. -Recommend aspirin 324 mg followed by 81 mg daily -Heparin infusion is reasonable x 48 hours; again would recommend head CT at some point.  - Start high intensity statin via tube -Would hold antihypertensives while on NE -Monitor on telemetry - Echocardiogram today -Ischemic evaluation may be warranted going forward, but his agitation will need to improve.  Signed: Armando Reichert MD 12/10/2020, 8:02 AM

## 2020-12-10 NOTE — Progress Notes (Signed)
NAME:  Ryan Mayer, MRN:  [id number], DOB:  1955-06-07, LOS: 1 ADMISSION DATE:  11/12/2020   BRIEF SYNOPSIS 65 yo with cognitive dysfunction with psychiatric and neurological diease with severe resp failure due to aspiration of bloody secretions leading to asphyxia leading to emergent intubation with severe hyponatremia History of Present Illness:    Significant Hospital Events: Including procedures, antibiotic start and stop dates in addition to other pertinent events   11/30 intubated, sedated 12/1 remains on vent, severe resp failure    Antimicrobials:   Antibiotics Given (last 72 hours)     None        Interim History / Subjective:  Remains encephalopathic and severe resp failure Aspiration of bloody secretions Remains critically ill      Objective   Blood pressure (!) 88/56, pulse 60, temperature 98.2 F (36.8 C), resp. rate 16, height 5' (1.524 m), weight 67.7 kg, SpO2 99 %.    Vent Mode: PRVC FiO2 (%):  [30 %-50 %] 30 % Set Rate:  [16 bmp] 16 bmp Vt Set:  [450 mL] 450 mL PEEP:  [5 cmH20] 5 cmH20 Plateau Pressure:  [12 cmH20] 12 cmH20   Intake/Output Summary (Last 24 hours) at 12/10/2020 0738 Last data filed at 12/10/2020 0923 Gross per 24 hour  Intake 914.95 ml  Output 3850 ml  Net -2935.05 ml   Filed Weights   11/24/2020 1756 12/09/20 1620  Weight: 70 kg 67.7 kg      REVIEW OF SYSTEMS  PATIENT IS UNABLE TO PROVIDE COMPLETE REVIEW OF SYSTEMS DUE TO SEVERE CRITICAL ILLNESS AND TOXIC METABOLIC ENCEPHALOPATHY  ALL OTHER ROS ARE NEGATIVE   PHYSICAL EXAMINATION:  GENERAL:critically ill appearing, +resp distress EYES: Pupils equal, round, reactive to light.  No scleral icterus.  MOUTH: Moist mucosal membrane. INTUBATED NECK: Supple.  PULMONARY: +rhonchi, +wheezing CARDIOVASCULAR: S1 and S2.  No murmurs  GASTROINTESTINAL: Soft, nontender, -distended. Positive bowel sounds.  MUSCULOSKELETAL: No swelling, clubbing, or edema.  NEUROLOGIC:  obtunded SKIN:intact,warm,dry  BMP Latest Ref Rng & Units 12/09/2020 12/09/2020 12/09/2020  Glucose 70 - 99 mg/dL - 136(H) -  BUN 8 - 23 mg/dL - 14 -  Creatinine 0.61 - 1.24 mg/dL - 0.75 -  Sodium 135 - 145 mmol/L 123(L) 122(L) 122(L)  Potassium 3.5 - 5.1 mmol/L - 3.0(L) -  Chloride 98 - 111 mmol/L - 97(L) -  CO2 22 - 32 mmol/L - 18(L) -  Calcium 8.9 - 10.3 mg/dL - 7.7(L) -     Labs/imaging that I havepersonally reviewed  (right click and "Reselect all SmartList Selections" daily)      ASSESSMENT AND PLAN SYNOPSIS 65 yo with cognitive dysfunction with psychiatric and neurological diease with severe resp failure due to aspiration of bloody secretions leading to asphyxia leading to emergent intubation with severe hyponatremia     Severe ACUTE Hypoxic and Hypercapnic Respiratory Failure -continue Mechanical Ventilator support -continue Bronchodilator Therapy -Wean Fio2 and PEEP as tolerated -VAP/VENT bundle implementation -will NOT perform SAT/SBT when respiratory parameters are met  Vent Mode: PRVC FiO2 (%):  [30 %-50 %] 30 % Set Rate:  [16 bmp] 16 bmp Vt Set:  [450 mL] 450 mL PEEP:  [5 cmH20] 5 cmH20 Plateau Pressure:  [12 cmH20] 12 cmH20   SEVERE DRUG WITHDRAWAL -Therapy with Thiamine and MVI -CIWA Protocol -Precedex as needed -high risk for aspiration  ACUTE CARDIAC FAILURE-NSTEMI -oxygen as needed -Lasix as tolerated -follow up cardiac enzymes as indicated -follow up cardiology recs On heparin infusion-will STOP to  assess for head bleed CT head pending   CARDIAC ICU monitoring   KIDNEY  -continue Foley Catheter-assess need -Avoid nephrotoxic agents -Follow urine output, BMP -Ensure adequate renal perfusion, optimize oxygenation -Renal dose medications   Intake/Output Summary (Last 24 hours) at 12/10/2020 0738 Last data filed at 12/10/2020 6256 Gross per 24 hour  Intake 914.95 ml  Output 3850 ml  Net -2935.05 ml     NEUROLOGY Acute toxic  metabolic encephalopathy, need for sedation Goal RASS -2 to -3   HYPOVOLUMIC SHOCK SOURCE-dehydration -use vasopressors to keep MAP>65 as needed -follow ABG and LA -follow up cultures   INFECTIOUS DISEASE -follow up cultures   ENDO - ICU hypoglycemic\Hyperglycemia protocol -check FSBS per protocol   GI GI PROPHYLAXIS as indicated  NUTRITIONAL STATUS DIET-->TF's as tolerated Constipation protocol as indicated   ELECTROLYTES -follow labs as needed -replace as needed -pharmacy consultation and following    Best practice (right click and "Reselect all SmartList Selections" daily)  Diet: NPO Pain/Anxiety/Delirium protocol (if indicated): Yes (RASS goal -2) VAP protocol (if indicated): Yes DVT prophylaxis: Subcutaneous Heparin GI prophylaxis: PPI Foley:  N/A Mobility:  bed rest  Code Status:  FULL Disposition:ICU    Labs   CBC: Recent Labs  Lab 11/18/2020 1804  WBC 5.6  NEUTROABS 3.3  HGB 13.3  HCT 35.8*  MCV 87.3  PLT 389    Basic Metabolic Panel: Recent Labs  Lab 12/09/2020 1804 12/09/20 0135 12/09/20 0529 12/09/20 1459 12/09/20 2247  NA 122* 121* 122* 122* 123*  K 3.3* 3.2*  --  3.0*  --   CL 91* 93*  --  97*  --   CO2 24 21*  --  18*  --   GLUCOSE 103* 143*  --  136*  --   BUN 14 14  --  14  --   CREATININE 0.78 0.96  --  0.75  --   CALCIUM 8.7* 8.5*  --  7.7*  --   MG 1.9  --  1.4*  --   --    GFR: Estimated Creatinine Clearance: 76.3 mL/min (by C-G formula based on SCr of 0.75 mg/dL). Recent Labs  Lab 11/16/2020 1804  WBC 5.6    Liver Function Tests: Recent Labs  Lab 11/28/2020 1804  AST 16  ALT 15  ALKPHOS 84  BILITOT 0.7  PROT 7.4  ALBUMIN 4.0   No results for input(s): LIPASE, AMYLASE in the last 168 hours. No results for input(s): AMMONIA in the last 168 hours.  ABG    Component Value Date/Time   PHART 7.36 12/09/2020 1835   PCO2ART 42 12/09/2020 1835   PO2ART 136 (H) 12/09/2020 1835   HCO3 23.7 12/09/2020 1835    ACIDBASEDEF 1.7 12/09/2020 1835   O2SAT 99.0 12/09/2020 1835     Coagulation Profile: No results for input(s): INR, PROTIME in the last 168 hours.  Cardiac Enzymes: Recent Labs  Lab 12/09/20 1516  CKTOTAL 462*    HbA1C: No results found for: HGBA1C  CBG: No results for input(s): GLUCAP in the last 168 hours.  Allergies No Known Allergies     DVT/GI PRX  assessed I Assessed the need for Labs I Assessed the need for Foley I Assessed the need for Central Venous Line Family Discussion when available I Assessed the need for Mobilization I made an Assessment of medications to be adjusted accordingly Safety Risk assessment completed  CASE DISCUSSED IN MULTIDISCIPLINARY ROUNDS WITH ICU TEAM     Critical Care Time devoted  to patient care services described in this note is 45 minutes.  Critical care was necessary to treat or prevent imminent or life-threatening deterioration.   PATIENT WITH VERY POOR PROGNOSIS I ANTICIPATE PROLONGED ICU LOS  Patient with Multiorgan failure and at high risk for cardiac arrest and death.    Corrin Parker, M.D.  [facility]  Medical Director [place] Director [facility] Cardio-Pulmonary Department

## 2020-12-10 NOTE — Progress Notes (Signed)
[place] for heparin infusion Indication: chest pain/ACS  No Known Allergies  Patient Measurements: Height: 5' (152.4 cm) Weight: 67.7 kg (149 lb 4 oz) IBW/kg (Calculated) : 50 Heparin Dosing Weight: 64.7 kg  Vital Signs: Temp: 94.5 F (34.7 C) (12/01 0000) Temp Source: Esophageal (12/01 0000) BP: 90/64 (12/01 0015) Pulse Rate: 55 (12/01 0015)  Labs: Recent Labs    11/21/2020 1804 12/09/20 0135 12/09/20 1459 12/09/20 1516 12/09/20 1837 12/10/20 0113  HGB 13.3  --   --   --   --   --   HCT 35.8*  --   --   --   --   --   PLT 230  --   --   --   --   --   HEPARINUNFRC  --   --   --   --   --  0.53  CREATININE 0.78 0.96 0.75  --   --   --   CKTOTAL  --   --   --  462*  --   --   TROPONINIHS  --   --  1,162*  --  772*  --      Estimated Creatinine Clearance: 76.3 mL/min (by C-G formula based on SCr of 0.75 mg/dL).   Medical History: Past Medical History:  Diagnosis Date   Hypertension    Intellectual disability    Seizure ([place])     Medications:  Scheduled:   amLODipine  5 mg Oral Daily   aspirin  243 mg Oral Once   aspirin EC  81 mg Oral Daily   atropine  1 drop Left Eye 6 X Daily   bisacodyl  10 mg Oral Daily   Or   bisacodyl  10 mg Rectal Daily   brimonidine  1 drop Left Eye TID   carbamazepine  200 mg Oral TID   chlorhexidine gluconate (MEDLINE KIT)  15 mL Mouth Rinse BID   Chlorhexidine Gluconate Cloth  6 each Topical Q0600   cloNIDine  0.1 mg Oral TID   docusate  100 mg Per Tube BID   dorzolamide-timolol  1 drop Left Eye BID   fesoterodine  8 mg Oral Daily   latanoprost  1 drop Left Eye QHS   loratadine  10 mg Oral Daily   LORazepam  1 mg Oral QHS   mouth rinse  15 mL Mouth Rinse 10 times per day   methylPREDNISolone (SOLU-MEDROL) injection  40 mg Intravenous Once   metoprolol tartrate  2.5 mg Intravenous Q6H   nitroGLYCERIN  0.5 inch Topical Q6H   OLANZapine  10 mg Oral QHS   pantoprazole (PROTONIX) IV   40 mg Intravenous Q24H   polyethylene glycol  17 g Per Tube Daily   traZODone  100 mg Oral QHS    Assessment: 63 YOM w/ PMH of intellectual disability, agitation, epilepsy, HTN, chronic constipation, ingestion of nonedible products and hypertension sent to ED from group home with uncontrolled blood pressure and admitted with acute hyponatremia and agitation and NSTEMI. According to the med rec he is not on any chronic anticoagulation prior to admission.  Goal of Therapy:  Heparin level 0.3-0.7 units/ml Monitor platelets by anticoagulation protocol: Yes  1201 0113 HL 0.53, therapeutic x 1   Plan:  Continue heparin infusion at 900 units/hr Recheck HL in 6 hours to confirm CBC daily while on heparin.  Renda Rolls, PharmD, [facility] 12/10/2020 2:24 AM

## 2020-12-10 NOTE — Progress Notes (Signed)
*  PRELIMINARY RESULTS* Echocardiogram 2D Echocardiogram has been performed.  Ryan Mayer 12/10/2020, 9:21 AM

## 2020-12-10 NOTE — Progress Notes (Signed)
Initial Nutrition Assessment  DOCUMENTATION CODES:   Not applicable  INTERVENTION:   -Initiate TF via OGT:   Vital High Protein @ 20 ml/hr and increase by 10 ml every 4 hours to goal rate of 40 ml/hr. (960 ml daily)  45 ml Prosource TF daily.    30 ml free water flush every 4 hours for tube patency  Tube feeding regimen provides 1000 kcal (60% of needs), 95 grams of protein, and 803 ml of H2O. Total free water: 983 ml daily  TF + propofol to provides 1750 kcals (meeting 100% of needs)  NUTRITION DIAGNOSIS:   Inadequate oral intake related to inability to eat as evidenced by NPO status.  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   Patient is currently intubated on ventilator support. OGT placement verified by x-ray MV: 7.2 L/min Temp (24hrs), Avg:97.4 F (36.3 C), Min:94.5 F (34.7 C), Max:100.9 F (38.3 C)  Propofol: 28.4 ml/hr (provides 750 kcals)  Reviewed I/O's: -2.9 L x 24 hours   UOP: 3.7 L x 24 hours  Rectal tube output: 200 ml x 24 hours  Case discussed with MD, RN, and during ICU rounds. Pt is a resident of [facility]. He was intubated in the ED secondary to agitation.   Per MD notes, pt is very constipated. He is currently having output from rectal tube.   Pt with mucous plug and blood secretions this AM. Plan to stop heparin drip and go to CT at 1130. Per MD, plan to start feeds after CT.    Medications reviewed and include dulcolax, colace, ativan, and miralax.   Labs reviewed: Na: 127, K: 2.8, CBGS: 119  (inpatient orders for glycemic control are none).    NUTRITION - FOCUSED PHYSICAL EXAM:  Flowsheet Row Most Recent Value  Orbital Region No depletion  Upper Arm Region No depletion  Thoracic and Lumbar Region No depletion  Buccal Region No depletion  Temple Region No depletion  Clavicle Bone Region No depletion  Clavicle and Acromion Bone Region No depletion  Scapular Bone Region No depletion  Dorsal Hand No depletion  Patellar Region Mild depletion  Anterior Thigh Region Mild depletion  Posterior Calf Region Mild depletion  Edema (RD Assessment) Mild  Hair Reviewed  Eyes Reviewed  Mouth Reviewed  Skin Reviewed  Nails Reviewed       Diet Order:   Diet Order             Diet NPO time specified  Diet effective now                   EDUCATION NEEDS:   Not appropriate for education at this time  Skin:  Skin Assessment: Reviewed RN Assessment  Last BM:  12/10/20 (200 ml via rectal tube)  Height:   Ht Readings from Last 1 Encounters:  12/10/20 5' (1.524 m)    Weight:   Wt Readings from Last 1 Encounters:  12/09/20 67.7 kg    Ideal Body Weight:  48.2 kg  BMI:  Body mass index is 29.15  kg/m.  Estimated Nutritional Needs:   Kcal:  1678  Protein:  90-105 grams  Fluid:  > 1.6 L    Levada Schilling, RD, LDN, CDCES Registered Dietitian II Certified Diabetes Care and Education Specialist Please refer to [facility] for RD and/or RD on-call/weekend/after hours pager

## 2020-12-10 DEATH — deceased

## 2020-12-11 ENCOUNTER — Inpatient Hospital Stay: Payer: Self-pay

## 2020-12-11 ENCOUNTER — Inpatient Hospital Stay: Payer: Medicare Other

## 2020-12-11 DIAGNOSIS — E871 Hypo-osmolality and hyponatremia: Secondary | ICD-10-CM | POA: Diagnosis not present

## 2020-12-11 LAB — GLUCOSE, CAPILLARY
Glucose-Capillary: 118 mg/dL — ABNORMAL HIGH (ref 70–99)
Glucose-Capillary: 115 mg/dL — ABNORMAL HIGH (ref 70–99)
Glucose-Capillary: 100 mg/dL — ABNORMAL HIGH (ref 70–99)
Glucose-Capillary: 126 mg/dL — ABNORMAL HIGH (ref 70–99)
Glucose-Capillary: 145 mg/dL — ABNORMAL HIGH (ref 70–99)
Glucose-Capillary: 145 mg/dL — ABNORMAL HIGH (ref 70–99)

## 2020-12-11 LAB — HEPATIC FUNCTION PANEL
ALT: 22 U/L (ref 0–44)
AST: 43 U/L — ABNORMAL HIGH (ref 15–41)
Albumin: 2.7 g/dL — ABNORMAL LOW (ref 3.5–5.0)
Alkaline Phosphatase: 57 U/L (ref 38–126)
Bilirubin, Direct: <0.1 mg/dL (ref 0.0–0.2)
Total Bilirubin: 0.3 mg/dL (ref 0.3–1.2)
Total Protein: 5.5 g/dL — ABNORMAL LOW (ref 6.5–8.1)

## 2020-12-11 LAB — CBC
HCT: 28.2 % — ABNORMAL LOW (ref 39.0–52.0)
Hemoglobin: 10.2 g/dL — ABNORMAL LOW (ref 13.0–17.0)
MCH: 32.7 pg (ref 26.0–34.0)
MCHC: 36.2 g/dL — ABNORMAL HIGH (ref 30.0–36.0)
MCV: 90.4 fL (ref 80.0–100.0)
Platelets: 199 10*3/uL (ref 150–400)
RBC: 3.12 MIL/uL — ABNORMAL LOW (ref 4.22–5.81)
RDW: 12.6 % (ref 11.5–15.5)
WBC: 8.3 10*3/uL (ref 4.0–10.5)
nRBC: 0 % (ref 0.0–0.2)

## 2020-12-11 LAB — BASIC METABOLIC PANEL
Anion gap: 2 — ABNORMAL LOW (ref 5–15)
BUN: 15 mg/dL (ref 8–23)
CO2: 22 mmol/L (ref 22–32)
Calcium: 7.7 mg/dL — ABNORMAL LOW (ref 8.9–10.3)
Chloride: 108 mmol/L (ref 98–111)
Creatinine, Ser: 0.88 mg/dL (ref 0.61–1.24)
GFR, Estimated: >60 mL/min (ref >60)
Glucose, Bld: 119 mg/dL — ABNORMAL HIGH (ref 70–99)
Potassium: 4.2 mmol/L (ref 3.5–5.1)
Sodium: 132 mmol/L — ABNORMAL LOW (ref 135–145)

## 2020-12-11 LAB — PHOSPHORUS
Phosphorus: 2.5 mg/dL (ref 2.5–4.6)
Phosphorus: 2.4 mg/dL — ABNORMAL LOW (ref 2.5–4.6)

## 2020-12-11 LAB — SODIUM
Sodium: 131 mmol/L — ABNORMAL LOW (ref 135–145)
Sodium: 125 mmol/L — ABNORMAL LOW (ref 135–145)

## 2020-12-11 MED ORDER — METHYLPREDNISOLONE SODIUM SUCC 40 MG IJ SOLR
40.0000 mg | Freq: Two times a day (BID) | INTRAMUSCULAR | Status: DC
  Administered 2020-12-11 – 2020-12-13 (×6): 40 mg via INTRAVENOUS
  Filled 2020-12-11 (×7): qty 1

## 2020-12-11 MED ORDER — ETOMIDATE 2 MG/ML IV SOLN
INTRAVENOUS | Status: AC
  Administered 2020-12-11: 20 mg
  Filled 2020-12-11: qty 10

## 2020-12-11 MED ORDER — ETOMIDATE 2 MG/ML IV SOLN: 20.0000 mg | Freq: Once | INTRAVENOUS | Status: DC

## 2020-12-11 MED ORDER — DIAZEPAM 5 MG/ML IJ SOLN: 5.0000 mg | Freq: Four times a day (QID) | INTRAMUSCULAR | Status: DC

## 2020-12-11 MED ORDER — ROCURONIUM BROMIDE 50 MG/5ML IV SOLN
50.0000 mg | Freq: Once | INTRAVENOUS | Status: AC
  Administered 2020-12-11: 50 mg via INTRAVENOUS
  Filled 2020-12-11: qty 5

## 2020-12-11 MED ORDER — FENTANYL CITRATE (PF) 100 MCG/2ML IJ SOLN: 100.0000 ug | Freq: Once | INTRAMUSCULAR | Status: DC

## 2020-12-11 MED ORDER — DIAZEPAM 5 MG/ML IJ SOLN
5.0000 mg | Freq: Once | INTRAMUSCULAR | Status: AC
  Administered 2020-12-11: 5 mg via INTRAVENOUS

## 2020-12-11 MED ORDER — FENTANYL CITRATE (PF) 100 MCG/2ML IJ SOLN
INTRAMUSCULAR | Status: AC
  Administered 2020-12-11: 100 ug
  Filled 2020-12-11: qty 2

## 2020-12-11 MED ORDER — SODIUM CHLORIDE 0.9 % IV SOLN
3.0000 g | Freq: Four times a day (QID) | INTRAVENOUS | Status: DC
  Administered 2020-12-11 – 2020-12-13 (×8): 3 g via INTRAVENOUS
  Filled 2020-12-11: qty 3
  Filled 2020-12-11: qty 8
  Filled 2020-12-11 (×2): qty 3
  Filled 2020-12-11 (×6): qty 8
  Filled 2020-12-11: qty 3

## 2020-12-11 MED ORDER — VITAL HIGH PROTEIN PO LIQD
1000.0000 mL | ORAL | Status: DC
  Administered 2020-12-11 – 2020-12-16 (×6): 1000 mL

## 2020-12-11 MED ORDER — ROCURONIUM BROMIDE 10 MG/ML (PF) SYRINGE
PREFILLED_SYRINGE | INTRAVENOUS | Status: AC
  Filled 2020-12-11: qty 10

## 2020-12-11 MED ORDER — DIAZEPAM 5 MG/ML IJ SOLN
INTRAMUSCULAR | Status: AC
  Filled 2020-12-11: qty 2

## 2020-12-11 MED ORDER — DEXMEDETOMIDINE HCL IN NACL 400 MCG/100ML IV SOLN
0.4000 ug/kg/h | INTRAVENOUS | Status: DC
  Administered 2020-12-11: 0.4 ug/kg/h via INTRAVENOUS
  Filled 2020-12-11: qty 100

## 2020-12-11 MED ORDER — SODIUM CHLORIDE 0.9% FLUSH: 10.0000 mL | INTRAVENOUS | Status: DC | PRN

## 2020-12-11 MED ORDER — SODIUM CHLORIDE 0.9% FLUSH
10.0000 mL | Freq: Two times a day (BID) | INTRAVENOUS | Status: DC
  Administered 2020-12-11 – 2020-12-13 (×5): 10 mL
  Administered 2020-12-14: 30 mL
  Administered 2020-12-14 – 2020-12-17 (×5): 10 mL

## 2020-12-11 NOTE — Procedures (Signed)
Intubation Procedure Note  JAMARKUS LISBON  [id number]  09-Apr-1955  Date:12/11/20  Time:11:43 AM   Provider Performing:Yazleen Molock D Elvina Sidle    Procedure: Intubation (31500)  Indication(s) Respiratory Failure  Consent Unable to obtain consent due to emergent nature of procedure.   Anesthesia Etomidate, Fentanyl, and Rocuronium   Time Out Verified patient identification, verified procedure, site/side was marked, verified correct patient position, special equipment/implants available, medications/allergies/relevant history reviewed, required imaging and test results available.   Sterile Technique Usual hand hygeine, masks, and gloves were used   Procedure Description Patient positioned in bed supine.  Sedation given as noted above.  Patient was intubated with endotracheal tube using Glidescope.  View was Grade 1 full glottis .  Number of attempts was 1.  Colorimetric CO2 detector was consistent with tracheal placement.   Complications/Tolerance None; patient tolerated the procedure well. Chest X-ray is ordered to verify placement.   EBL Minimal   Specimen(s) None   Size 8.0 ETT  Tube secured at 23 cm at the lip.    Harlon Ditty, AGACNP-BC [facility] Prefer epic messenger for cross cover needs If after hours, please call E-link

## 2020-12-11 NOTE — Progress Notes (Addendum)
Nutrition Follow-up  DOCUMENTATION CODES:   Not applicable  INTERVENTION:   -Initiate TF via OGT:   Vital High Protein @ 20 ml/hr and increase by 10 ml every 4 hours to goal rate of 40 ml/hr. (960 ml daily)   30 ml free water flush every 4 hours for tube patency  Tube feeding regimen provides 960 kcal (66% of needs), 84 grams of protein, and 803 ml of H2O.  Total free water: 983 ml daily  TF + propofol provides 1710 kcals (meeting >100% of needs)  NUTRITION DIAGNOSIS:   Inadequate oral intake related to inability to eat as evidenced by NPO status.  Ongoing  GOAL:   Patient will meet greater than or equal to 90% of their needs  Progressing; TF to be resumed   MONITOR:   Vent status, Labs, Weight trends, TF tolerance, Skin, I & O's  REASON FOR ASSESSMENT:   Ventilator    ASSESSMENT:   65 yo with cognitive dysfunction with psychiatric and neurological diease with severe resp failure due to aspiration of bloody secretions leading to asphyxia leading to emergent intubation with severe hyponatremia  11/30- intubated 12/2- s/p extubated, re-intubated due to possible aspiration event  Patient is currently intubated on ventilator support. Tip of OGT is in the stomach.  MV: 7.3 L/min Temp (24hrs), Avg:97.6 F (36.4 C), Min:96.8 F (36 C), Max:98.4 F (36.9 C)  Propofol: 28.4 ml/hr (provides 750 kcals daily)  MAP: 79  Case discussed with RN, MD< and during ICU rounds.   Pt extubated earlier this morning but re intubated due to possible aspiration event. Per NP, ok to resume TF.   Medications reviewed and include colace, ativan, solu-medrol, precedex, levophed, and miralax.   Labs reviewed: Na: 132, CBGS: 100-138 (inpatient orders for glycemic control are none).    Diet Order:   Diet Order             Diet NPO time specified  Diet effective now                   EDUCATION NEEDS:   Not appropriate for education at this time  Skin:  Skin Assessment:  Reviewed RN Assessment  Last BM:  12/11/20  Height:   Ht Readings from Last 1 Encounters:  12/11/20 5' (1.524 m)    Weight:   Wt Readings from Last 1 Encounters:  12/11/20 67.4 kg    Ideal Body Weight:  48.2 kg  BMI:  Body mass index is 29.02 kg/m.  Estimated Nutritional Needs:   Kcal:  1445  Protein:  80-100 grams  Fluid:  > 1.4 L    Levada Schilling, RD, LDN, CDCES Registered Dietitian II Certified Diabetes Care and Education Specialist Please refer to [facility] for RD and/or RD on-call/weekend/after hours pager

## 2020-12-11 NOTE — Progress Notes (Signed)
[facility] Cardiology  CARDIOLOGY CONSULT NOTE  Patient ID: Ryan Mayer MRN: [id number] DOB/AGE: 10-Sep-1955 65 y.o.  Admit date: 11/17/2020 Referring Physician Candelaria Stagers Primary Physician Lauro Regulus, MD Primary Cardiologist NA Reason for Consultation Elevated troponin  HPI:  Ryan Mayer is a 65 year old male with history of severe intellectual disability with difficult to control behaviors and agitation, epilepsy with generalized tonic-clonic seizures, hypertension, chronic ingestion of nonedible products who was sent from his group home for elevated blood pressure and worsening agitation.  Cardiology is consulted because a troponin was checked and was elevated to 1100, decreasing therafter.  Interval history - Remains intubated. CT head negative for acute pathology.  - On significant sedation this morning because of ongoing issues with agitation.  - Minimal vent settings.  - no acute events  Review of systems complete and found to be negative unless listed above     Past Medical History:  Diagnosis Date   Hypertension    Intellectual disability    Seizure (HCC)     History reviewed. No pertinent surgical history.  Medications Prior to Admission  Medication Sig Dispense Refill Last Dose   acetaZOLAMIDE ER (DIAMOX) 500 MG capsule Take 500 mg by mouth every other day.   Past Week   amLODipine (NORVASC) 5 MG tablet Take 5 mg by mouth daily.   11/18/2020   aspirin EC 81 MG tablet Take 81 mg by mouth daily. Swallow whole.   11/20/2020   atropine 1 % ophthalmic solution Place 1 drop into the left eye 6 (six) times daily.   11/10/2020   brimonidine (ALPHAGAN) 0.2 % ophthalmic solution Place 1 drop into the left eye 3 (three) times daily.   11/20/2020   carbamazepine (TEGRETOL) 200 MG tablet Take 200 mg by mouth 3 (three) times daily.   12/01/2020   carvedilol (COREG) 25 MG tablet Take 25 mg by mouth 2 (two) times daily with a meal.   11/24/2020   cetirizine (ZYRTEC) 10 MG tablet  Take 10 mg by mouth 2 (two) times daily.   11/27/2020   cloNIDine (CATAPRES) 0.1 MG tablet Take 0.1 mg by mouth 3 (three) times daily. 0700, NOON, 1800   11/14/2020 at 1200   docusate sodium (COLACE) 100 MG capsule Take 100 mg by mouth 3 (three) times daily as needed for mild constipation or moderate constipation.   12/01/2020   dorzolamide-timolol (COSOPT) 22.3-6.8 MG/ML ophthalmic solution Place 1 drop into the left eye 2 (two) times daily.   11/16/2020   Emollient (CETAPHIL) cream Apply 1 application topically as directed. Apply after bath   12/07/2020   escitalopram (LEXAPRO) 10 MG tablet Take 10 mg by mouth daily.   11/29/2020   fesoterodine (TOVIAZ) 8 MG TB24 tablet Take 8 mg by mouth daily.   11/14/2020   fluticasone (FLONASE) 50 MCG/ACT nasal spray Place 1 spray into both nostrils 2 (two) times daily.   12/06/2020   lisinopril (ZESTRIL) 20 MG tablet Take 20 mg by mouth daily.   11/28/2020 at am   lisinopril-hydrochlorothiazide (ZESTORETIC) 20-12.5 MG tablet Take 1 tablet by mouth daily.   11/25/2020 at am   LORazepam (ATIVAN) 1 MG tablet Take 1 mg by mouth in the morning and at bedtime. (1700 & 2000)   12/04/2020   pantoprazole (PROTONIX) 40 MG tablet Take 40 mg by mouth daily.   11/15/2020 at am   prednisoLONE acetate (PRED FORTE) 1 % ophthalmic suspension Place 1 drop into the left eye 6 (six) times daily.  01-05-21   risperiDONE (RISPERDAL) 0.5 MG tablet Take 1 mg by mouth See admin instructions. Take 2 tablets (1 mg) by mouth every morning and 2 tablets (1 mg) at bedtime   Jan 05, 2021   acetaminophen (TYLENOL) 325 MG tablet Take 650 mg by mouth every 4 (four) hours as needed for fever.   prn at unknown   bisacodyl (DULCOLAX) 5 MG EC tablet Take 10 mg by mouth every 12 (twelve) hours as needed for severe constipation.   prn at unknown   camphor-menthol [facility]) lotion Apply 1 application topically as needed for itching.   prn at unknown   carbamide peroxide (DEBROX) 6.5 % OTIC solution  Place 5-10 drops into both ears every Friday.   prn at unknown   chlorhexidine (PERIDEX) 0.12 % solution Use as directed 5 mLs in the mouth or throat 2 (two) times daily.   prn at unknown   diphenhydrAMINE (BENADRYL) 25 mg capsule Take 25 mg by mouth every 6 (six) hours as needed.   prn at unknown   guaiFENesin (ROBITUSSIN) 100 MG/5ML SOLN Take 5 mLs by mouth every 4 (four) hours as needed for cough.   prn at unknown   hydrOXYzine (ATARAX/VISTARIL) 50 MG tablet Take 50 mg by mouth 3 (three) times daily as needed for itching or anxiety.   prn   latanoprost (XALATAN) 0.005 % ophthalmic solution Place 1 drop into the left eye at bedtime.   12/07/2020   levocetirizine (XYZAL) 5 MG tablet Take 5 mg by mouth at bedtime.    12/07/2020 at pm   loperamide (IMODIUM) 2 MG capsule Take 2-4 mg by mouth See admin instructions. Take 2 capsules (4mg ) by mouth after first loose stool then take 1 capsule (2mg ) by mouth after each additional loose stool (PRN)   prn at unknown   LORazepam (ATIVAN) 1 MG tablet Take 1 mg by mouth daily as needed for anxiety or sedation.   prn at unknown   metroNIDAZOLE (METROCREAM) 0.75 % cream Apply 1 application topically at bedtime. (Apply to face)   prn at unknown   naltrexone (DEPADE) 50 MG tablet Take 50 mg by mouth daily. (Patient not taking: Reported on 12/09/2020)   Not Taking   neomycin-bacitracin-polymyxin (NEOSPORIN) ointment Apply 1 application topically daily as needed for wound care.   prn at unknown   OLANZapine (ZYPREXA) 10 MG tablet Take 10 mg by mouth at bedtime.      polyethylene glycol (MIRALAX / GLYCOLAX) 17 g packet Take 17 g by mouth daily.   prn at unknown   potassium chloride SA (KLOR-CON) 20 MEQ tablet Take 1 tablet (20 mEq total) by mouth daily. (Patient not taking: Reported on 12/09/2020) 4 tablet 0 Not Taking   tolnaftate (TINACTIN) 1 % cream Apply 1 application topically 2 (two) times daily as needed (itchy feet).   prn at unknown   traZODone (DESYREL) 50 MG  tablet Take 50-125 mg by mouth 3 (three) times daily. Take one 50 mg tablet by mouth every morning, one 50 mg tablet at 2 PM, and two and one-half tablets 125 mg at bedtime   12/07/2020   Social History   Socioeconomic History   Marital status: Single    Spouse name: Not on file   Number of children: Not on file   Years of education: Not on file   Highest education level: Not on file  Occupational History   Not on file  Tobacco Use   Smoking status: Never   Smokeless tobacco: Never  Substance and Sexual Activity   Alcohol use: Never   Drug use: Never   Sexual activity: Not on file  Other Topics Concern   Not on file  Social History Narrative   Not on file   Social Determinants of Health   Financial Resource Strain: Not on file  Food Insecurity: Not on file  Transportation Needs: Not on file  Physical Activity: Not on file  Stress: Not on file  Social Connections: Not on file  Intimate Partner Violence: Not on file    History reviewed. No pertinent family history.    Review of systems complete and found to be negative unless listed above    PHYSICAL EXAM  General: Intubated and sedated.  HEENT:  Normocephalic and atramatic Neck:  No JVD.  Lungs: Clear bilaterally to auscultation and percussion. Heart: HRRR . Normal S1 and S2 without gallops or murmurs.  Abdomen: Bowel sounds are positive, abdomen soft and non-tender  Msk:  Back normal, normal gait. Normal strength and tone for age. Extremities: No clubbing, cyanosis or edema.   Neuro: Alert and oriented X 3. Psych:  Good affect, responds appropriately  Labs:   Lab Results  Component Value Date   WBC 8.3 12/11/2020   HGB 10.2 (L) 12/11/2020   HCT 28.2 (L) 12/11/2020   MCV 90.4 12/11/2020   PLT 199 12/11/2020    Recent Labs  Lab 01-07-2021 1804 12/09/20 0135 12/11/20 0423  NA 122*   < > 132*  K 3.3*   < > 4.2  CL 91*   < > 108  CO2 24   < > 22  BUN 14   < > 15  CREATININE 0.78   < > 0.88  CALCIUM  8.7*   < > 7.7*  PROT 7.4  --   --   BILITOT 0.7  --   --   ALKPHOS 84  --   --   ALT 15  --   --   AST 16  --   --   GLUCOSE 103*   < > 119*   < > = values in this interval not displayed.    Lab Results  Component Value Date   CKTOTAL 2,632 (H) 12/10/2020   TROPONINI <0.03 10/12/2015   No results found for: CHOL No results found for: HDL No results found for: [facility] Lab Results  Component Value Date   TRIG 200 (H) 12/10/2020   No results found for: CHOLHDL No results found for: LDLDIRECT    Radiology: DG Abd 1 View  Result Date: 12/09/2020 CLINICAL DATA:  OG tube EXAM: ABDOMEN - 1 VIEW COMPARISON:  12/09/2020, CT 09/30/2020 FINDINGS: Esophageal tube tip overlies the mid stomach. Tubing is folded back upon itself in the proximal stomach. Linear metallic opacities over the stomach are again noted. Bowel gas pattern unobstructed with moderate stool. IMPRESSION: 1. Esophageal tube folded back upon itself over proximal stomach, tip overlies the mid gastric region 2. Multiple linear metallic foreign bodies over the stomach as before Electronically Signed   By: Jasmine Pang M.D.   On: 12/09/2020 20:16   CT HEAD WO CONTRAST ( )  Result Date: 12/10/2020 CLINICAL DATA:  Mental status change. EXAM: CT HEAD WITHOUT CONTRAST TECHNIQUE: Contiguous axial images were obtained from the base of the skull through the vertex without intravenous contrast. 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 Small chronic infarct right anterior frontal lobe. Chronic microvascular ischemic changes are present bilaterally unchanged from the  prior study Negative for acute infarct, hemorrhage, mass Vascular: Negative for hyperdense vessel Skull: Negative Sinuses/Orbits: Negative Other: None IMPRESSION: No acute abnormality no change from the recent CT. Chronic ischemic changes in the right frontal and right parietal lobe. Chronic microvascular ischemic changes.  Electronically Signed   By: Marlan Palau M.D.   On: 12/10/2020 13:24   US RENAL  Result Date: 12/10/2020 CLINICAL DATA:  Acute urinary retention. EXAM: RENAL / URINARY TRACT ULTRASOUND COMPLETE COMPARISON:  None. FINDINGS: Right Kidney: Renal measurements: 11.1 x 4.8 x 5.0 = volume: 141 mL. Echogenicity within normal limits. No mass or hydronephrosis visualized. Left Kidney: Renal measurements: 8.1 x 5.1 x 4.4 = volume: 96 mL. Echogenicity within normal limits. No mass or hydronephrosis visualized, evaluation is however limited due to bowel gas shadowing. Bladder: Urinary bladder is collapsed about the for his catheter. Other: None. IMPRESSION: 1. No evidence of nephrolithiasis or hydronephrosis. Limited evaluation of the left kidney due to bowel gas shadowing. 2.  Urinary bladder is collapsed about the Foley's catheter. Electronically Signed   By: Larose Hires D.O.   On: 12/10/2020 09:39   DG Chest Port 1 View  Result Date: 12/09/2020 CLINICAL DATA:  OG tube placement EXAM: PORTABLE CHEST 1 VIEW COMPARISON:  Radiograph 12/09/2020 FINDINGS: Endotracheal tube tip overlies the right mainstem bronchus, which should be retracted by approximately 4.0 cm. Orogastric tube side port overlies the distal esophagus, could be advanced by 7.0 cm. Unchanged cardiomediastinal silhouette. There is no new airspace disease. There is no large pleural effusion. No pneumothorax. Osseous structures are unchanged. Unchanged linear metallic densities overlying the stomach. IMPRESSION: Endotracheal tube tip overlies the right mainstem bronchus, recommend retraction by 4.0 cm. Orogastric tube side port overlies the distal esophagus, recommend advancement by 7.0 cm. No new airspace disease. Critical Value/emergent results were called by telephone at the time of interpretation on 12/09/2020 at 6:47 pm to provider taking care of the patient, who verbally acknowledged these results. Electronically Signed   By: Caprice Renshaw M.D.   On:  12/09/2020 18:59   DG Chest Portable 1 View  Result Date: 12/09/2020 CLINICAL DATA:  Worsened shortness of breath and hyponatremia EXAM: PORTABLE CHEST 1 VIEW COMPARISON:  Earlier today at 8:27 a.m. FINDINGS: AP portable radiograph. Numerous leads and wires project over the chest. Patient rotated minimally left. Midline trachea. Normal heart size. Atherosclerosis in the transverse aorta. No pleural effusion or pneumothorax. Mild right hemidiaphragm elevation. No congestive failure. Low lung volumes with resultant pulmonary interstitial prominence. IMPRESSION: Cardiomegaly and low lung volumes.  No acute findings. Aortic Atherosclerosis (ICD10-I70.0). Electronically Signed   By: Jeronimo Greaves M.D.   On: 12/09/2020 14:54   DG Chest Port 1 View  Result Date: 12/09/2020 CLINICAL DATA:  65 year old male with fever and constipation. Intellectual disability. EXAM: PORTABLE CHEST 1 VIEW COMPARISON:  Portable chest 09/30/2020 and earlier. FINDINGS: Portable AP supine view at 0827 hours. Continued low but mildly improved lung volumes. Mediastinal contours are stable and within normal limits. Similar mild crowding of lung markings at the right hilum. But otherwise Allowing for portable technique the lungs are clear. Visualized tracheal air column is within normal limits. Left upper quadrant chronically ingested metallic wires appear mixed with retained stool in the colon at the splenic flexure as on the September CT. No acute osseous abnormality identified. IMPRESSION: Low but mildly improved lung volumes since 10/01/2018. No acute cardiopulmonary abnormality. Electronically Signed   By: Odessa Fleming M.D.   On: 12/09/2020 08:45  DG Abd Portable 1V  Result Date: 12/09/2020 CLINICAL DATA:  65 year old male with fever and constipation. Intellectual disability. EXAM: PORTABLE ABDOMEN - 1 VIEW COMPARISON:  CT Abdomen and Pelvis 09/30/2020 and earlier. FINDINGS: Portable AP supine view at 0829 hours. Nonobstructed bowel  gas pattern, but there is a moderate to large volume of retained stool in the colon. This is similar to the September CT Abdomen and Pelvis. Other abdominal and pelvic visceral contours appear normal. No acute osseous abnormality identified. IMPRESSION: Moderate to large volume of retained stool in the colon, similar to September CT Abdomen and Pelvis. Electronically Signed   By: Odessa Fleming M.D.   On: 12/09/2020 08:43    EKG: Initial EKG: Sinus tachycardia with anterolateral ST depressions. . Follow-up EKG: Sinus tach at a rate of 102 with resolution of ST depressions.  ASSESSMENT AND PLAN:  Ryan Mayer is a 65 year old male with history of severe intellectual disability with difficult to control behaviors and agitation, epilepsy with generalized tonic-clonic seizures, hypertension, chronic ingestion of nonedible products who was sent from his group home for elevated blood pressure and worsening agitation.  Cardiology is consulted because a troponin was checked and was elevated to 1100, decreasing therafter.  # Elevated troponin # Hypertensive urgency # Severe agitation # hyponatremia, SIADH # Acute hypoxic respiratory failure The patient is admitted with severely elevated blood pressure, agitation, and was also discovered to have a sodium of 122 likely due to SIADH.  He is also febrile.  Receiving fluids for 24 hours and developed respiratory distress prompting a troponin to be checked which was elevated at 1100 and decreasing thereafter.  His initial EKG on presentation does show anterolateral ST depressions which certainly could suggest some underlying ischemia in the setting of elevated blood pressure.  His EKG is normalized with improvement in blood pressure.  Most likely his troponin elevation is related to demand in the setting of multiple systemic ongoing stressors.  We will treat conservatively. - Continue aspirin 81 mg -OK to hold heparin as demand ischemic is suspected, but continue to monitor  closely for hemodynamic changes and EKG changes. - Start high intensity statin via tube if LFTs are normal - Obtain daily EKG -Hold antihypertensives while on NE -Monitor on telemetry -Ischemic evaluation may be warranted going forward, but his agitation will need to improve.  Signed: Armando Reichert MD 12/11/2020, 8:04 AM

## 2020-12-11 NOTE — Progress Notes (Signed)
VAST RN tried to contact patient's legal guardian by phone twice in attempt to get consent for PICC placement. Primary RN made aware that unable to place PICC without consent unless MD deems emergent placement consent. Will follow up.

## 2020-12-11 NOTE — Progress Notes (Signed)
Telephone consent for PICC placement obtained with Kathalene Frames, legal guardian. Will proceed with PICC placement as ordered.

## 2020-12-11 NOTE — Progress Notes (Signed)
Pt remains agitated/delirious.  Noted to have gurgling and hypoxic ~ concern for ongoing aspiration.    Discussed with Dr. Belia Heman, will reintubate emergently.    Harlon Ditty, AGACNP-BC [facility] Prefer epic messenger for cross cover needs If after hours, please call E-link

## 2020-12-11 NOTE — Progress Notes (Signed)
Pt extubated to 2L [place], tolerating well at this time.

## 2020-12-11 NOTE — Progress Notes (Signed)
NAME:  Ryan Mayer, MRN:  [id number], DOB:  Apr 14, 1955, LOS: 2 ADMISSION DATE:  12/02/2020, CONSULTATION DATE:  12/09/2020 REFERRING MD:  Dr. Cyndia Skeeters, CHIEF COMPLAINT:  Acute Respiratory Distress   Brief Pt Description / Synopsis:  65 yo with PMH of intellectual disability, agitation, epilepsy admitted with Hypertensive Urgency, severe acute Hyponatremia (? SIADH), agitation, and elevated troponin (demand ischemia vs NSTEMI). Developed Acute Hypoxic Respiratory Failure on 11/30 due to aspiration of bloody secretions leading to asphyxia requiring emergent intubation.  Pertinent  Medical History  Intellectual disability Seizures Hypertension  Micro Data:  11/10/2020: SARS-CoV-2 and influenza PCR>> negative 12/09/2020: MRSA PCR>> negative 12/11/2020: Tracheal aspirate>>  Antimicrobials:  Unasyn 12/2>>  Significant Hospital Events: Including procedures, antibiotic start and stop dates in addition to other pertinent events   11/29: Admitted by Hospitalist 11/30 Developed acute hypoxic respiratory failure due to aspiration, emergently intubated,  12/1 remains on vent, severe resp failure 12/2: Extubated.  However required reintubation shortly after due to concern for aspiration and hypoxia.  Unasyn started.  Plan for bronchoscopy and PICC line placement  Interim History / Subjective:  -No acute events noted overnight -Afebrile, requiring Levophed at 7 mics. -This morning patient on minimal vent settings -Placed in spontaneous mode and tolerating well ~extubated -Post extubation patient delirious, developed gurgling respirations and hypoxia concerning for aspiration -Required emergent reintubation -Empirically placed on Unasyn -Tentative plan for bronchoscopy per Dr. Mortimer Fries -We will obtain PICC line placement  Objective   Blood pressure (!) 95/58, pulse 61, temperature (!) 97.5 F (36.4 C), resp. rate 16, height 5' (1.524 m), weight 67.4 kg, SpO2 97 %.    Vent Mode: PRVC FiO2 (%):   [21 %-28 %] 21 % Set Rate:  [16 bmp] 16 bmp Vt Set:  [450 mL] 450 mL PEEP:  [5 cmH20] 5 cmH20 Plateau Pressure:  [14 cmH20] 14 cmH20   Intake/Output Summary (Last 24 hours) at 12/11/2020 7001 Last data filed at 12/11/2020 0900 Gross per 24 hour  Intake 2183.07 ml  Output 3200 ml  Net -1016.93 ml   Filed Weights   11/24/2020 1756 12/09/20 1620 12/11/20 0403  Weight: 70 kg 67.7 kg 67.4 kg    Examination: General: Acutely ill-appearing male, laying in bed, intubated and sedated, no acute distress HENT: Atraumatic, normocephalic, neck supple, no JVD Lungs: Coarse breath sounds bilaterally, overbreathing the vent, even Cardiovascular: Bradycardia, regular rhythm, S1-S2, no murmurs, rubs, gallops, 2+ distal pulses Abdomen: Soft, nontender, nondistended, no guarding rebound tenderness, bowel sounds positive all 4 Extremities: Normal bulk and tone, no deformities, no edema Neuro: Patient currently sedated, nursing reports when sedation is lightened patient is extremely agitated and delirious trying to self extubate and get out of bed, pupils unequal (left 5 mm, right 2 mm) GU: Foley catheter in place  Resolved Hospital Problem list     Assessment & Plan:   Acute Hypoxic Respiratory Failure due to Aspiration -Extubated 12/11/2020, however required reintubation shortly after -Full vent support, implement lung protective strategies -Plateau pressures less than 30 cm H20 -Wean FiO2 & PEEP as tolerated to maintain O2 sats >92% -Follow intermittent Chest X-ray & ABG as needed -Spontaneous Breathing Trials when respiratory parameters met and mental status permits -Implement VAP Bundle -Prn Bronchodilators -Start Unasyn -Tentative plan for Bronchoscopy with Dr. Mortimer Fries on 12/2  Hypotension, ? septic shock vs Sedation related Elevated Troponin, demand ischemia vs NSTEMI Hypertensive Urgency (earlier in hospital stay) -Continuous cardiac monitoring -Maintain MAP >65 -IV fluids -Vasopressors  as needed to maintain  MAP goal -Trend lactic acid until normalized -Trend HS Troponin until peaked -Echocardiogram pending -Cardiology following, appreciate input -Continue ASA 81 mg daily -Per Cardiology can hold Heparin gtt (suspect demand ischemia) -Hold antihypertensives due to requirement for vasopressors  Concern for Aspiration Pneumonia -Monitor fever curve -Trend WBC's & Procalcitonin -Follow cultures as above -Start empiric Unasyn pending cultures & sensitivities  Hyponatremia, ? SIADH (pt is on Tegretol and multiple antipsychotic medications) -Monitor I&O's / urinary output -Follow BMP -Ensure adequate renal perfusion -Avoid nephrotoxic agents as able -Replace electrolytes as indicated  Acute Metabolic Encephalopathy Intellectual disability/agitation Sedation needs in setting of mechanical ventilation PMHx: Seizure disorder/epilepsy -Maintain a RASS goal of 0 to -1 -Fentanyl and Propofol drips as needed to maintain RASS goal -Avoid sedating medications as able -Daily wake up assessment -CT Head 12/1 negative for acute intracranial abnormality -Continue Tegretol -Prn Benzodiazepines      Best Practice (right click and "Reselect all SmartList Selections" daily)   Diet/type: tubefeeds and NPO DVT prophylaxis: SCD GI prophylaxis: PPI Lines: N/A Foley:  Yes, and it is still needed Code Status:  full code Last date of multidisciplinary goals of care discussion [12/11/2020]  Labs   CBC: Recent Labs  Lab 12/02/2020 1804 12/10/20 0733 12/11/20 0423  WBC 5.6 8.1 8.3  NEUTROABS 3.3  --   --   HGB 13.3 9.9* 10.2*  HCT 35.8* 26.8* 28.2*  MCV 87.3 86.2 90.4  PLT 230 198 789    Basic Metabolic Panel: Recent Labs  Lab 12/05/2020 1804 12/09/20 0135 12/09/20 0529 12/09/20 1459 12/09/20 2247 12/10/20 0733 12/10/20 1433 12/10/20 2256 12/11/20 0423  NA 122* 121* 122* 122* 123* 127* 126* 129* 132*  K 3.3* 3.2*  --  3.0*  --  2.8*  --   --  4.2  CL 91* 93*   --  97*  --  98  --   --  108  CO2 24 21*  --  18*  --  22  --   --  22  GLUCOSE 103* 143*  --  136*  --  126*  --   --  119*  BUN 14 14  --  14  --  13  --   --  15  CREATININE 0.78 0.96  --  0.75  --  0.75  --   --  0.88  CALCIUM 8.7* 8.5*  --  7.7*  --  7.6*  --   --  7.7*  MG 1.9  --  1.4*  --   --  2.2  --   --   --   PHOS  --   --   --   --   --  3.2  3.1 2.9  --  2.5   GFR: Estimated Creatinine Clearance: 69.3 mL/min (by C-G formula based on SCr of 0.88 mg/dL). Recent Labs  Lab 11/15/2020 1804 12/10/20 0733 12/11/20 0423  WBC 5.6 8.1 8.3    Liver Function Tests: Recent Labs  Lab 11/22/2020 1804 12/10/20 0733 12/11/20 0441  AST 16  --  43*  ALT 15  --  22  ALKPHOS 84  --  57  BILITOT 0.7  --  0.3  PROT 7.4  --  5.5*  ALBUMIN 4.0 2.9* 2.7*   No results for input(s): LIPASE, AMYLASE in the last 168 hours. No results for input(s): AMMONIA in the last 168 hours.  ABG    Component Value Date/Time   PHART 7.36 12/09/2020 1835   PCO2ART 42 12/09/2020  [place] (H) 12/09/2020 1835   HCO3 23.7 12/09/2020 1835   ACIDBASEDEF 1.7 12/09/2020 1835   O2SAT 99.0 12/09/2020 1835     Coagulation Profile: No results for input(s): INR, PROTIME in the last 168 hours.  Cardiac Enzymes: Recent Labs  Lab 12/09/20 1516 12/10/20 0733  CKTOTAL 462* 2,632*    HbA1C: No results found for: HGBA1C  CBG: Recent Labs  Lab 12/10/20 1611 12/10/20 1938 12/10/20 2331 12/11/20 0333 12/11/20 0721  GLUCAP 128* 140* 138* 118* 115*    Review of Systems:   Unable to assess due to Intubation & sedation   Past Medical History:  He,  has a past medical history of Hypertension, Intellectual disability, and Seizure ([place]).   Surgical History:  History reviewed. No pertinent surgical history.   Social History:   reports that he has never smoked. He has never used smokeless tobacco. He reports that he does not drink alcohol and does not use drugs.   Family History:  His  family history is not on file.   Allergies No Known Allergies   Home Medications  Prior to Admission medications   Medication Sig Start Date End Date Taking? Authorizing Provider  acetaZOLAMIDE ER (DIAMOX) 500 MG capsule Take 500 mg by mouth every other day. 12/07/20  Yes [provider]  amLODipine (NORVASC) 5 MG tablet Take 5 mg by mouth daily. 09/10/19  Yes [provider]  aspirin EC 81 MG tablet Take 81 mg by mouth daily. Swallow whole.   Yes [provider]  atropine 1 % ophthalmic solution Place 1 drop into the left eye 6 (six) times daily.   Yes [provider]  brimonidine (ALPHAGAN) 0.2 % ophthalmic solution Place 1 drop into the left eye 3 (three) times daily.   Yes [provider]  carbamazepine (TEGRETOL) 200 MG tablet Take 200 mg by mouth 3 (three) times daily. 09/10/19  Yes [provider]  carvedilol (COREG) 25 MG tablet Take 25 mg by mouth 2 (two) times daily with a meal.   Yes [provider]  cetirizine (ZYRTEC) 10 MG tablet Take 10 mg by mouth 2 (two) times daily.   Yes [provider]  cloNIDine (CATAPRES) 0.1 MG tablet Take 0.1 mg by mouth 3 (three) times daily. 0700, NOON, 1800   Yes [provider]  docusate sodium (COLACE) 100 MG capsule Take 100 mg by mouth 3 (three) times daily as needed for mild constipation or moderate constipation.   Yes [provider]  dorzolamide-timolol (COSOPT) 22.3-6.8 MG/ML ophthalmic solution Place 1 drop into the left eye 2 (two) times daily.   Yes [provider]  Emollient (CETAPHIL) cream Apply 1 application topically as directed. Apply after bath   Yes [provider]  escitalopram (LEXAPRO) 10 MG tablet Take 10 mg by mouth daily. 09/10/19  Yes [provider]  fesoterodine (TOVIAZ) 8 MG TB24 tablet Take 8 mg by mouth daily.   Yes [provider]  fluticasone (FLONASE) 50 MCG/ACT nasal spray Place 1 spray into both  nostrils 2 (two) times daily. 09/19/19  Yes [provider]  lisinopril (ZESTRIL) 20 MG tablet Take 20 mg by mouth daily. 11/07/20  Yes [provider]  lisinopril-hydrochlorothiazide (ZESTORETIC) 20-12.5 MG tablet Take 1 tablet by mouth daily. 12/07/20  Yes [provider]  LORazepam (ATIVAN) 1 MG tablet Take 1 mg by mouth in the morning and at bedtime. (1700 & 2000)   Yes [provider]  pantoprazole (PROTONIX) 40 MG tablet Take 40 mg by mouth daily. 09/10/19  Yes [provider]  prednisoLONE acetate (PRED FORTE) 1 % ophthalmic suspension Place 1 drop into the left eye 6 (six) times daily.   Yes [provider]  risperiDONE (RISPERDAL) 0.5 MG tablet Take 1 mg by mouth See admin instructions. Take 2 tablets (1 mg) by mouth every morning and 2 tablets (1 mg) at bedtime   Yes [provider]  acetaminophen (TYLENOL) 325 MG tablet Take 650 mg by mouth every 4 (four) hours as needed for fever.    [provider]  bisacodyl (DULCOLAX) 5 MG EC tablet Take 10 mg by mouth every 12 (twelve) hours as needed for severe constipation.    [provider]  camphor-menthol Timoteo Ace) lotion Apply 1 application topically as needed for itching.    [provider]  carbamide peroxide (DEBROX) 6.5 % OTIC solution Place 5-10 drops into both ears every Friday.    [provider]  chlorhexidine (PERIDEX) 0.12 % solution Use as directed 5 mLs in the mouth or throat 2 (two) times daily.    [provider]  diphenhydrAMINE (BENADRYL) 25 mg capsule Take 25 mg by mouth every 6 (six) hours as needed.    [provider]  guaiFENesin (ROBITUSSIN) 100 MG/5ML SOLN Take 5 mLs by mouth every 4 (four) hours as needed for cough.    [provider]  hydrOXYzine (ATARAX/VISTARIL) 50 MG tablet Take 50 mg by mouth 3 (three) times daily as needed for itching or anxiety.    [provider]  latanoprost (XALATAN)  0.005 % ophthalmic solution Place 1 drop into the left eye at bedtime.    [provider]  levocetirizine (XYZAL) 5 MG tablet Take 5 mg by mouth at bedtime.  09/10/19   [provider]  loperamide (IMODIUM) 2 MG capsule Take 2-4 mg by mouth See admin instructions. Take 2 capsules (26m) by mouth after first loose stool then take 1 capsule (211m by mouth after each additional loose stool (PRN)    [provider]  LORazepam (ATIVAN) 1 MG tablet Take 1 mg by mouth daily as needed for anxiety or sedation.    [provider]  metroNIDAZOLE (METROCREAM) 0.75 % cream Apply 1 application topically at bedtime. (Apply to face)    [provider]  naltrexone (DEPADE) 50 MG tablet Take 50 mg by mouth daily. Patient not taking: Reported on 12/09/2020    [provider]  neomycin-bacitracin-polymyxin (NEOSPORIN) ointment Apply 1 application topically daily as needed for wound care.    [provider]  OLANZapine (ZYPREXA) 10 MG tablet Take 10 mg by mouth at bedtime. 11/18/2020   [provider]  polyethylene glycol (MIRALAX / GLYCOLAX) 17 g packet Take 17 g by mouth daily.    [provider]  potassium chloride SA (KLOR-CON) 20 MEQ tablet Take 1 tablet (20 mEq total) by mouth daily. Patient not taking: Reported on 12/09/2020 10/02/20   DaEdwin DadaMD  tolnaftate (TINACTIN) 1 % cream Apply 1 application topically 2 (two) times daily as needed (itchy feet).    [provider]  traZODone (DESYREL) 50 MG tablet Take 50-125 mg by mouth 3 (three) times daily. Take one 50 mg tablet by mouth every morning, one 50 mg tablet at 2 PM, and two and one-half tablets 125 mg at bedtime    [provider]     Critical care time: 40 minutes  Darel Hong, AGACNP-BC [facility] Prefer epic messenger for cross cover needs If after hours, please call E-link

## 2020-12-11 NOTE — Progress Notes (Signed)
Pharmacy Antibiotic Note  Ryan Mayer is a 65 y.o. male admitted on 01-01-2021. Patient was intubated 11/30 and extubated 12/2, but required reintubation shortly after for continued agitation/delirium, hypoxia and gurgling. Concern for ongoing aspiration. Pharmacy has been consulted for Unasyn dosing.  Plan: Unasyn 3 g IV q6h  Height: 5' (152.4 cm) Weight: 67.4 kg (148 lb 9.4 oz) IBW/kg (Calculated) : 50  Temp (24hrs), Avg:97.6 F (36.4 C), Min:96.8 F (36 C), Max:98.8 F (37.1 C)  Recent Labs  Lab 01-01-2021 1804 12/09/20 0135 12/09/20 1459 12/10/20 0733 12/11/20 0423  WBC 5.6  --   --  8.1 8.3  CREATININE 0.78 0.96 0.75 0.75 0.88    Estimated Creatinine Clearance: 69.3 mL/min (by C-G formula based on SCr of 0.88 mg/dL).    No Known Allergies  Antimicrobials this admission: Unasyn 12/2 >>   Microbiology results: 11/30 MRSA PCR: negative  Thank you for allowing pharmacy to be a part of this patient's care.  Pricilla Riffle, PharmD, BCPS Clinical Pharmacist 12/11/2020 11:51 AM

## 2020-12-11 NOTE — Progress Notes (Signed)
Peripherally Inserted Central Catheter Placement  The IV Nurse has discussed with the patient and/or persons authorized to consent for the patient, the purpose of this procedure and the potential benefits and risks involved with this procedure.  The benefits include less needle sticks, lab draws from the catheter, and the patient may be discharged home with the catheter. Risks include, but not limited to, infection, bleeding, blood clot (thrombus formation), and puncture of an artery; nerve damage and irregular heartbeat and possibility to perform a PICC exchange if needed/ordered by physician.  Alternatives to this procedure were also discussed.  Bard Power PICC patient education guide, fact sheet on infection prevention and patient information card has been provided to patient /or left at bedside.    PICC Placement Documentation  PICC Triple Lumen 12/11/20 PICC Right Basilic 40 cm 0 cm (Active)  Indication for Insertion or Continuance of Line Vasoactive infusions 12/11/20 1445  Exposed Catheter (cm) 0 cm 12/11/20 1445  Site Assessment Clean;Dry;Intact 12/11/20 1445  Lumen #1 Status Flushed;Saline locked;Blood return noted 12/11/20 1445  Lumen #2 Status Flushed;Saline locked;Blood return noted 12/11/20 1445  Lumen #3 Status Flushed;Saline locked;Blood return noted 12/11/20 1445  Dressing Type Transparent;Securing device 12/11/20 1445  Dressing Status Clean;Dry;Intact 12/11/20 1445  Antimicrobial disc in place? Yes 12/11/20 1445  Safety Lock Not Applicable 12/11/20 1445  Dressing Intervention New dressing 12/11/20 1445  Dressing Change Due 12/18/20 12/11/20 1445       Curt Jews 12/11/2020, 2:56 PM

## 2020-12-12 ENCOUNTER — Inpatient Hospital Stay: Payer: Medicare Other

## 2020-12-12 ENCOUNTER — Encounter: Payer: Self-pay | Admitting: Internal Medicine

## 2020-12-12 DIAGNOSIS — E871 Hypo-osmolality and hyponatremia: Secondary | ICD-10-CM | POA: Diagnosis not present

## 2020-12-12 LAB — CBC
HCT: 30.1 % — ABNORMAL LOW (ref 39.0–52.0)
Hemoglobin: 10.7 g/dL — ABNORMAL LOW (ref 13.0–17.0)
MCH: 33.8 pg (ref 26.0–34.0)
MCHC: 35.5 g/dL (ref 30.0–36.0)
MCV: 95 fL (ref 80.0–100.0)
Platelets: 208 10*3/uL (ref 150–400)
RBC: 3.17 MIL/uL — ABNORMAL LOW (ref 4.22–5.81)
RDW: 13 % (ref 11.5–15.5)
WBC: 12.1 10*3/uL — ABNORMAL HIGH (ref 4.0–10.5)
nRBC: 0 % (ref 0.0–0.2)

## 2020-12-12 LAB — BASIC METABOLIC PANEL
Anion gap: 6 (ref 5–15)
BUN: 15 mg/dL (ref 8–23)
CO2: 22 mmol/L (ref 22–32)
Calcium: 7.7 mg/dL — ABNORMAL LOW (ref 8.9–10.3)
Chloride: 104 mmol/L (ref 98–111)
Creatinine, Ser: 0.81 mg/dL (ref 0.61–1.24)
GFR, Estimated: >60 mL/min (ref >60)
Glucose, Bld: 158 mg/dL — ABNORMAL HIGH (ref 70–99)
Potassium: 4.3 mmol/L (ref 3.5–5.1)
Sodium: 132 mmol/L — ABNORMAL LOW (ref 135–145)

## 2020-12-12 LAB — GLUCOSE, CAPILLARY
Glucose-Capillary: 118 mg/dL — ABNORMAL HIGH (ref 70–99)
Glucose-Capillary: 126 mg/dL — ABNORMAL HIGH (ref 70–99)
Glucose-Capillary: 127 mg/dL — ABNORMAL HIGH (ref 70–99)
Glucose-Capillary: 140 mg/dL — ABNORMAL HIGH (ref 70–99)
Glucose-Capillary: 143 mg/dL — ABNORMAL HIGH (ref 70–99)
Glucose-Capillary: 149 mg/dL — ABNORMAL HIGH (ref 70–99)
Glucose-Capillary: 156 mg/dL — ABNORMAL HIGH (ref 70–99)

## 2020-12-12 LAB — ECHOCARDIOGRAM COMPLETE
AR max vel: 2.81 cm2
AV Area VTI: 3.31 cm2
AV Area mean vel: 2.63 cm2
AV Mean grad: 5 mmHg
AV Peak grad: 10 mmHg
Ao pk vel: 1.58 m/s
Area-P 1/2: 2.48 cm2
Height: 60 in
MV VTI: 3.4 cm2
S' Lateral: 3.05 cm
Weight: 2388.02 oz

## 2020-12-12 LAB — SODIUM
Sodium: 133 mmol/L — ABNORMAL LOW (ref 135–145)
Sodium: 134 mmol/L — ABNORMAL LOW (ref 135–145)

## 2020-12-12 LAB — PHOSPHORUS: Phosphorus: 3.5 mg/dL (ref 2.5–4.6)

## 2020-12-12 LAB — MAGNESIUM: Magnesium: 2.1 mg/dL (ref 1.7–2.4)

## 2020-12-12 MED ORDER — NOREPINEPHRINE 4 MG/250ML-% IV SOLN
0.0000 ug/min | INTRAVENOUS | Status: DC
  Administered 2020-12-12: 6 ug/min via INTRAVENOUS
  Administered 2020-12-12: 7 ug/min via INTRAVENOUS
  Administered 2020-12-15: 2 ug/min via INTRAVENOUS
  Administered 2020-12-16: 4 ug/min via INTRAVENOUS
  Administered 2020-12-17: 3 ug/min via INTRAVENOUS
  Filled 2020-12-12 (×6): qty 250

## 2020-12-12 NOTE — Progress Notes (Signed)
Pharmacy Antibiotic Note  Ryan Mayer is a 65 y.o. male admitted on 10-Dec-2020. Patient was intubated 11/30 and extubated 12/2, but required reintubation shortly after for continued agitation/delirium, hypoxia and gurgling. Concern for ongoing aspiration. Pharmacy has been consulted for Unasyn dosing.  S/p bronch and PICC placement on 12/2  Plan: Unasyn 3 g IV q6h  Height: 5' (152.4 cm) Weight: 67.1 kg (147 lb 14.9 oz) IBW/kg (Calculated) : 50  Temp (24hrs), Avg:97.9 F (36.6 C), Min:96.4 F (35.8 C), Max:98.6 F (37 C)  Recent Labs  Lab 2020/12/10 1804 12/09/20 0135 12/09/20 1459 12/10/20 0733 12/11/20 0423 12/12/20 0208 12/12/20 0327  WBC 5.6  --   --  8.1 8.3  --  12.1*  CREATININE 0.78 0.96 0.75 0.75 0.88 0.81  --      Estimated Creatinine Clearance: 75 mL/min (by C-G formula based on SCr of 0.81 mg/dL).    No Known Allergies  Antimicrobials this admission: Unasyn 12/2 >>   Microbiology results: 11/30 MRSA PCR: negative 12/2 trach aspirate:MODERATE GRAM NEGATIVE RODS,MODERATE GRAM POSITIVE COCCI   Thank you for allowing pharmacy to be a part of this patient's care.  Angelique Blonder, PharmD Clinical Pharmacist 12/12/2020 11:54 AM

## 2020-12-12 NOTE — TOC Initial Note (Signed)
Transition of Care [facility]) - Initial/Assessment Note    Patient Details  Name: Ryan Mayer MRN: [id number] Date of Birth: 01/28/1955  Transition of Care [facility]) CM/SW Contact:    Hetty Ely, RN Phone Number: 12/12/2020, 10:25 AM  Clinical Narrative: Spoke with Legal Guardian Kathalene Frames [phone] who reports that patient lives in a group home managed by [name] in [place] since 1988. Ms. Eliot Ford says he will be discharged back to group home call Renae Gloss, [phone] to coordinate discharge. Ms. Eliot Ford says she will also be available if needed.                  Expected Discharge Plan: Group Home ([facility]) Barriers to Discharge: Continued Medical Work up   Patient Goals and CMS Choice Patient states their goals for this hospitalization and ongoing recovery are:: Per Guardian to return to Group Home      Expected Discharge Plan and Services Expected Discharge Plan: Group Home ([facility])       Living arrangements for the past 2 months: Group Home                                      Prior Living Arrangements/Services Living arrangements for the past 2 months: Group Home Lives with:: Facility Resident          Need for Family Participation in Patient Care: Yes (Comment) (Legal Guardian Kathalene Frames) Care giver support system in place?: Yes (comment) Leavy [facility]ella [facility] Discharge contact)   Criminal Activity/Legal Involvement Pertinent to Current Situation/Hospitalization: No - Comment as needed  Activities of Daily Living      Permission Sought/Granted                  Emotional Assessment   Attitude/Demeanor/Rapport: Unable to Assess Affect (typically observed): Unable to Assess   Alcohol / Substance Use: Not Applicable Psych Involvement: No (comment)  Admission diagnosis:  Acute hyponatremia [E87.1] Hyponatremia [E87.1] Hypertensive urgency [I16.0] Acute urinary retention  [R33.8] Constipation [K59.00] Patient Active Problem List   Diagnosis Date Noted   Acute hyponatremia 12/09/2020   Hypertensive urgency 12/09/2020   Agitation 12/09/2020   Hypokalemia 12/09/2020   Hyponatremia 09/30/2020   Epilepsy (HCC) 09/30/2020   Malnutrition of moderate degree 10/02/2019   Blood in stool, frank    Diarrhea    Colitis with rectal bleeding 09/30/2019   UTI (urinary tract infection) 09/30/2019   AKI (acute kidney injury) (HCC) 09/30/2019   Intellectual disability 09/30/2019   Pica 09/30/2019   Foreign body ingestion 09/30/2019   Hypotension 09/30/2019   HTN (hypertension), benign 09/07/2013   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 [place] [address] Phone: [phone] Fax: [phone]     Social Determinants of Health (SDOH) Interventions    Readmission Risk Interventions Readmission Risk Prevention Plan 12/12/2020  Transportation Screening Complete  Medication Review [facility]) Not Complete  Med Review Comments Resident at Group Home  PCP or Specialist appointment within 3-5 days of discharge Complete  HRI or Home Care Consult Complete  SW Recovery Care/Counseling Consult Complete  Palliative Care Screening Not Applicable  Skilled Nursing Facility Not Applicable  Some recent data might be hidden

## 2020-12-12 NOTE — Progress Notes (Addendum)
NAME:  Ryan Mayer, MRN:  [id number], DOB:  1955-06-23, LOS: 3 ADMISSION DATE:  12/09/2020, CONSULTATION DATE:  12/09/2020 REFERRING MD:  Dr. Cyndia Skeeters, CHIEF COMPLAINT:  Acute Respiratory Distress   Brief Pt Description / Synopsis:  65 yo with PMH of intellectual disability, agitation, epilepsy admitted with Hypertensive Urgency, severe acute Hyponatremia (? SIADH), agitation, and elevated troponin (demand ischemia vs NSTEMI). Developed Acute Hypoxic Respiratory Failure on 11/30 due to aspiration of bloody secretions leading to asphyxia requiring emergent intubation.  Pertinent  Medical History  Intellectual disability Seizures Hypertension  Micro Data:  12/09/2020: SARS-CoV-2 and influenza PCR>> negative 12/09/2020: MRSA PCR>> negative 12/11/2020: Tracheal aspirate>>  Antimicrobials:  Unasyn 12/2>>  Significant Hospital Events: Including procedures, antibiotic start and stop dates in addition to other pertinent events   11/29: Admitted by Hospitalist 11/30 Developed acute hypoxic respiratory failure due to aspiration, emergently intubated,  12/1: remains on vent, severe resp failure 12/2: Extubated.  However required reintubation shortly after due to concern for aspiration and hypoxia.  Unasyn started.  S/p bronch and PICC placement 12/3:  Interim History / Subjective:  -No acute events noted overnight -Remains on  Levophed at 7 mics. -I&Os since admit Net -3166  Objective   Blood pressure (!) 99/59, pulse 72, temperature 98.1 F (36.7 C), temperature source Esophageal, resp. rate 16, height 5' (1.524 m), weight 67.1 kg, SpO2 98 %.    Vent Mode: PRVC FiO2 (%):  [30 %-70 %] 30 % Set Rate:  [16 bmp] 16 bmp Vt Set:  [450 mL] 450 mL PEEP:  [5 cmH20] 5 cmH20 Plateau Pressure:  [15 cmH20] 15 cmH20   Intake/Output Summary (Last 24 hours) at 12/12/2020 1033 Last data filed at 12/12/2020 0900 Gross per 24 hour  Intake 2051.41 ml  Output 1165 ml  Net 886.41 ml    Filed Weights    12/09/20 1620 12/11/20 0403 12/12/20 0325  Weight: 67.7 kg 67.4 kg 67.1 kg   Examination: General: Acutely ill-appearing male, laying in bed, intubated and sedated, no acute distress HENT: Atraumatic, normocephalic, neck supple, no JVD Lungs: Coarse breath sounds bilaterally, overbreathing the vent, even Cardiovascular: Bradycardia, regular rhythm, S1-S2, no murmurs, rubs, gallops, 2+ distal pulses Abdomen: Soft, nontender, nondistended, no guarding rebound tenderness, bowel sounds positive all 4 Extremities: Normal bulk and tone, no deformities, no edema Neuro: Patient currently sedated, nursing reports when sedation is lightened patient is extremely agitated and delirious trying to self extubate and get out of bed, pupils unequal (left 5 mm, right 2 mm) GU: Foley catheter in place  Resolved Hospital Problem list     Assessment & Plan:  Acute Hypoxic Respiratory Failure due to Aspiration -Extubated 12/11/2020, however required reintubation shortly after S/p Bronchoscopy 12/2 -Full vent support, implement lung protective strategies -Plateau pressures less than 30 cm H20 -Wean FiO2 & PEEP as tolerated to maintain O2 sats >92% -Follow intermittent Chest X-ray & ABG as needed -Spontaneous Breathing Trials when respiratory parameters met and mental status permits -Implement VAP Bundle -Prn Bronchodilators -Continue Unasyn  Hypotension, ? septic shock vs Sedation related Elevated Troponin, demand ischemia vs NSTEMI Hypertensive Urgency (earlier in hospital stay) -Continuous cardiac monitoring -Maintain MAP >65 -IV fluids -Vasopressors as needed to maintain MAP goal -Trend lactic acid until normalized -Trend HS Troponin until peaked -Echocardiogram pending -Cardiology following, appreciate input -Continue ASA 81 mg daily -Per Cardiology can hold Heparin gtt (suspect demand ischemia) -Hold antihypertensives due to requirement for vasopressors  Concern for Aspiration  Pneumonia -Monitor fever curve -  Trend WBC's & Procalcitonin -Follow cultures as above -Continue empiric Unasyn pending cultures & sensitivities  Hyponatremia, ? SIADH (pt is on Tegretol and multiple antipsychotic medications) -Monitor I&O's / urinary output -Follow BMP -Ensure adequate renal perfusion -Avoid nephrotoxic agents as able -Replace electrolytes as indicated  Acute Metabolic Encephalopathy Intellectual disability/agitation Sedation needs in setting of mechanical ventilation PMHx: Seizure disorder/epilepsy -Maintain a RASS goal of 0 to -1 -Fentanyl and Propofol drips as needed to maintain RASS goal -Avoid sedating medications as able -Daily wake up assessment -CT Head 12/1 negative for acute intracranial abnormality -Continue Tegretol -Prn Benzodiazepines   Best Practice (right click and "Reselect all SmartList Selections" daily)   Diet/type: tubefeeds and NPO DVT prophylaxis: SCD GI prophylaxis: PPI Lines: N/A Foley:  Yes, and it is still needed Code Status:  full code Last date of multidisciplinary goals of care discussion [12/11/2020]  Labs   CBC: Recent Labs  Lab 11/29/2020 1804 12/10/20 0733 12/11/20 0423 12/12/20 0327  WBC 5.6 8.1 8.3 12.1*  NEUTROABS 3.3  --   --   --   HGB 13.3 9.9* 10.2* 10.7*  HCT 35.8* 26.8* 28.2* 30.1*  MCV 87.3 86.2 90.4 95.0  PLT 230 198 199 208     Basic Metabolic Panel: Recent Labs  Lab 12/05/2020 1804 12/09/20 0135 12/09/20 0529 12/09/20 1459 12/09/20 2247 12/10/20 0733 12/10/20 1433 12/10/20 2256 12/11/20 0423 12/11/20 1608 12/11/20 2256 12/12/20 0208  NA 122* 121* 122* 122*   < > 127* 126* 129* 132* 131* 125* 132*  K 3.3* 3.2*  --  3.0*  --  2.8*  --   --  4.2  --   --  4.3  CL 91* 93*  --  97*  --  98  --   --  108  --   --  104  CO2 24 21*  --  18*  --  22  --   --  22  --   --  22  GLUCOSE 103* 143*  --  136*  --  126*  --   --  119*  --   --  158*  BUN 14 14  --  14  --  13  --   --  15  --   --  15   CREATININE 0.78 0.96  --  0.75  --  0.75  --   --  0.88  --   --  0.81  CALCIUM 8.7* 8.5*  --  7.7*  --  7.6*  --   --  7.7*  --   --  7.7*  MG 1.9  --  1.4*  --   --  2.2  --   --   --   --   --  2.1  PHOS  --   --   --   --   --  3.2  3.1 2.9  --  2.5 2.4*  --  3.5   < > = values in this interval not displayed.    GFR: Estimated Creatinine Clearance: 75 mL/min (by C-G formula based on SCr of 0.81 mg/dL). Recent Labs  Lab 11/14/2020 1804 12/10/20 0733 12/11/20 0423 12/12/20 0327  WBC 5.6 8.1 8.3 12.1*     Liver Function Tests: Recent Labs  Lab 11/16/2020 1804 12/10/20 0733 12/11/20 0441  AST 16  --  43*  ALT 15  --  22  ALKPHOS 84  --  57  BILITOT 0.7  --  0.3  PROT 7.4  --  5.5*  ALBUMIN 4.0 2.9* 2.7*    No results for input(s): LIPASE, AMYLASE in the last 168 hours. No results for input(s): AMMONIA in the last 168 hours.  ABG    Component Value Date/Time   PHART 7.36 12/09/2020 1835   PCO2ART 42 12/09/2020 1835   PO2ART 136 (H) 12/09/2020 1835   HCO3 23.7 12/09/2020 1835   ACIDBASEDEF 1.7 12/09/2020 1835   O2SAT 99.0 12/09/2020 1835      Coagulation Profile: No results for input(s): INR, PROTIME in the last 168 hours.  Cardiac Enzymes: Recent Labs  Lab 12/09/20 1516 12/10/20 0733  CKTOTAL 462* 2,632*     HbA1C: No results found for: HGBA1C  CBG: Recent Labs  Lab 12/11/20 1601 12/11/20 1909 12/11/20 2312 12/12/20 0315 12/12/20 0739  GLUCAP 126* 145* 145* 149* 140*     Review of Systems:   Unable to assess due to Intubation & sedation   Past Medical History:  He,  has a past medical history of Hypertension, Intellectual disability, and Seizure ([place]).   Surgical History:  History reviewed. No pertinent surgical history.   Social History:   reports that he has never smoked. He has never used smokeless tobacco. He reports that he does not drink alcohol and does not use drugs.   Family History:  His family history is not on file.    Allergies No Known Allergies   Home Medications  Prior to Admission medications   Medication Sig Start Date End Date Taking? Authorizing Provider  acetaZOLAMIDE ER (DIAMOX) 500 MG capsule Take 500 mg by mouth every other day. 12/07/20  Yes [provider]  amLODipine (NORVASC) 5 MG tablet Take 5 mg by mouth daily. 09/10/19  Yes [provider]  aspirin EC 81 MG tablet Take 81 mg by mouth daily. Swallow whole.   Yes [provider]  atropine 1 % ophthalmic solution Place 1 drop into the left eye 6 (six) times daily.   Yes [provider]  brimonidine (ALPHAGAN) 0.2 % ophthalmic solution Place 1 drop into the left eye 3 (three) times daily.   Yes [provider]  carbamazepine (TEGRETOL) 200 MG tablet Take 200 mg by mouth 3 (three) times daily. 09/10/19  Yes [provider]  carvedilol (COREG) 25 MG tablet Take 25 mg by mouth 2 (two) times daily with a meal.   Yes [provider]  cetirizine (ZYRTEC) 10 MG tablet Take 10 mg by mouth 2 (two) times daily.   Yes [provider]  cloNIDine (CATAPRES) 0.1 MG tablet Take 0.1 mg by mouth 3 (three) times daily. 0700, NOON, 1800   Yes [provider]  docusate sodium (COLACE) 100 MG capsule Take 100 mg by mouth 3 (three) times daily as needed for mild constipation or moderate constipation.   Yes [provider]  dorzolamide-timolol (COSOPT) 22.3-6.8 MG/ML ophthalmic solution Place 1 drop into the left eye 2 (two) times daily.   Yes [provider]  Emollient (CETAPHIL) cream Apply 1 application topically as directed. Apply after bath   Yes [provider]  escitalopram (LEXAPRO) 10 MG tablet Take 10 mg by mouth daily. 09/10/19  Yes [provider]  fesoterodine (TOVIAZ) 8 MG TB24 tablet Take 8 mg by mouth daily.   Yes [provider]  fluticasone (FLONASE) 50 MCG/ACT nasal spray Place 1 spray into both nostrils 2 (two) times daily.  09/19/19  Yes [provider]  lisinopril (ZESTRIL) 20 MG tablet Take 20 mg by mouth  daily. 11/07/20  Yes [provider]  lisinopril-hydrochlorothiazide (ZESTORETIC) 20-12.5 MG tablet Take 1 tablet by mouth daily. 12/07/20  Yes [provider]  LORazepam (ATIVAN) 1 MG tablet Take 1 mg by mouth in the morning and at bedtime. (1700 & 2000)   Yes [provider]  pantoprazole (PROTONIX) 40 MG tablet Take 40 mg by mouth daily. 09/10/19  Yes [provider]  prednisoLONE acetate (PRED FORTE) 1 % ophthalmic suspension Place 1 drop into the left eye 6 (six) times daily.   Yes [provider]  risperiDONE (RISPERDAL) 0.5 MG tablet Take 1 mg by mouth See admin instructions. Take 2 tablets (1 mg) by mouth every morning and 2 tablets (1 mg) at bedtime   Yes [provider]  acetaminophen (TYLENOL) 325 MG tablet Take 650 mg by mouth every 4 (four) hours as needed for fever.    [provider]  bisacodyl (DULCOLAX) 5 MG EC tablet Take 10 mg by mouth every 12 (twelve) hours as needed for severe constipation.    [provider]  camphor-menthol Timoteo Ace) lotion Apply 1 application topically as needed for itching.    [provider]  carbamide peroxide (DEBROX) 6.5 % OTIC solution Place 5-10 drops into both ears every Friday.    [provider]  chlorhexidine (PERIDEX) 0.12 % solution Use as directed 5 mLs in the mouth or throat 2 (two) times daily.    [provider]  diphenhydrAMINE (BENADRYL) 25 mg capsule Take 25 mg by mouth every 6 (six) hours as needed.    [provider]  guaiFENesin (ROBITUSSIN) 100 MG/5ML SOLN Take 5 mLs by mouth every 4 (four) hours as needed for cough.    [provider]  hydrOXYzine (ATARAX/VISTARIL) 50 MG tablet Take 50 mg by mouth 3 (three) times daily as needed for itching or anxiety.    [provider]  latanoprost (XALATAN) 0.005 % ophthalmic solution  Place 1 drop into the left eye at bedtime.    [provider]  levocetirizine (XYZAL) 5 MG tablet Take 5 mg by mouth at bedtime.  09/10/19   [provider]  loperamide (IMODIUM) 2 MG capsule Take 2-4 mg by mouth See admin instructions. Take 2 capsules (66m) by mouth after first loose stool then take 1 capsule (220m by mouth after each additional loose stool (PRN)    [provider]  LORazepam (ATIVAN) 1 MG tablet Take 1 mg by mouth daily as needed for anxiety or sedation.    [provider]  metroNIDAZOLE (METROCREAM) 0.75 % cream Apply 1 application topically at bedtime. (Apply to face)    [provider]  naltrexone (DEPADE) 50 MG tablet Take 50 mg by mouth daily. Patient not taking: Reported on 12/09/2020    [provider]  neomycin-bacitracin-polymyxin (NEOSPORIN) ointment Apply 1 application topically daily as needed for wound care.    [provider]  OLANZapine (ZYPREXA) 10 MG tablet Take 10 mg by mouth at bedtime. 11/23/2020   [provider]  polyethylene glycol (MIRALAX / GLYCOLAX) 17 g packet Take 17 g by mouth daily.    [provider]  potassium chloride SA (KLOR-CON) 20 MEQ tablet Take 1 tablet (20 mEq total) by mouth daily. Patient not taking: Reported on 12/09/2020 10/02/20   DaEdwin DadaMD  tolnaftate (TINACTIN) 1 % cream Apply 1 application topically 2 (two) times daily as needed (itchy feet).    [provider]  traZODone (DESYREL) 50 MG tablet Take  50-125 mg by mouth 3 (three) times daily. Take one 50 mg tablet by mouth every morning, one 50 mg tablet at 2 PM, and two and one-half tablets 125 mg at bedtime    [provider]   Scheduled Meds:  aspirin  243 mg Oral Once   aspirin  81 mg Per Tube Daily   atropine  1 drop Left Eye 6 X Daily   brimonidine  1 drop Left Eye TID   carbamazepine  200 mg Per Tube TID   chlorhexidine gluconate (MEDLINE KIT)  15 mL Mouth Rinse BID    Chlorhexidine Gluconate Cloth  6 each Topical Q0600   cloNIDine  0.1 mg Per Tube TID   docusate  100 mg Per Tube BID   dorzolamide-timolol  1 drop Left Eye BID   etomidate  20 mg Intravenous Once   feeding supplement (VITAL HIGH PROTEIN)  1,000 mL Per Tube Q24H   fentaNYL (SUBLIMAZE) injection  100 mcg Intravenous Once   [START ON 12/14/2020] fesoterodine  8 mg Oral Daily   free water  30 mL Per Tube Q4H   latanoprost  1 drop Left Eye QHS   loratadine  10 mg Per Tube Daily   LORazepam  1 mg Per Tube QHS   mouth rinse  15 mL Mouth Rinse 10 times per day   methylPREDNISolone (SOLU-MEDROL) injection  40 mg Intravenous Once   methylPREDNISolone (SOLU-MEDROL) injection  40 mg Intravenous Q12H   nitroGLYCERIN  0.5 inch Topical Q6H   OLANZapine  10 mg Per Tube QHS   pantoprazole (PROTONIX) IV  40 mg Intravenous Q24H   polyethylene glycol  17 g Per Tube Daily   sodium chloride flush  10-40 mL Intracatheter Q12H   traZODone  100 mg Per Tube QHS   Continuous Infusions:  sodium chloride Stopped (12/11/20 1912)   ampicillin-sulbactam (UNASYN) IV 200 mL/hr at 12/12/20 0900   fentaNYL infusion INTRAVENOUS 300 mcg/hr (12/12/20 0900)   lactated ringers 999 mL/hr at 12/09/20 1809   norepinephrine (LEVOPHED) Adult infusion 7 mcg/min (12/12/20 0900)   propofol (DIPRIVAN) infusion 70 mcg/kg/min (12/12/20 0906)   PRN Meds:.bisacodyl, fentaNYL, hydrOXYzine, midazolam, midazolam, sodium chloride flush, sodium phosphate   Critical care time: 40 minutes      Rufina Falco, DNP, CCRN, FNP-C, AGACNP-BC Acute Care Nurse Practitioner  [facility] Pager: [phone] [place] [facility]  ICU ATTENDING ATTESTATION:  Patient seen and examined and relevant ancillary tests reviewed.   I agree with the assessment and plan of care as outlined by Rufina Falco NP.  This patient was not seen as a shared visit. The following reflects my independent critical care time.  I   personally  reviewed database in its entirety and discussed care plan in detail. In addition, this patient was discussed on multidisciplinary rounds.   I agree with assessment and plan.    Critical Care Time devoted to patient care services described in this note is 65 Total Care Time minutes.   Overall, patient is critically ill, prognosis is guarded.  Patient with Multiorgan failure and at high risk for cardiac arrest and death.    Corrin Parker, M.D.  [facility]  Medical Director [place] Director [facility] Cardio-Pulmonary Department

## 2020-12-12 NOTE — Progress Notes (Signed)
[facility] Cardiology    SUBJECTIVE: Intubated sedated   Vitals:   12/12/20 0547 12/12/20 0600 12/12/20 0630 12/12/20 0709  BP: (!) 90/58 (!) 91/57 (!) 89/55   Pulse: 73 71 71   Resp: 16 16 16    Temp: 98.6 F (37 C) 98.6 F (37 C) 97.7 F (36.5 C)   TempSrc:      SpO2: 97% 98% 98% 99%  Weight:      Height:         Intake/Output Summary (Last 24 hours) at 12/12/2020 0737 Last data filed at 12/12/2020 0400 Gross per 24 hour  Intake 1483.29 ml  Output 1640 ml  Net -156.71 ml      PHYSICAL EXAM  General: Well developed, well nourished, in no acute distress HEENT:  Normocephalic and atramatic Neck:  No JVD.  Lungs: Clear bilaterally to auscultation and percussion. Heart: HRRR . Normal S1 and S2 without gallops or murmurs.  Abdomen: Bowel sounds are positive, abdomen soft and non-tender  Msk:  Back normal, normal gait. Normal strength and tone for age. Extremities: No clubbing, cyanosis or edema.   Neuro: Alert and oriented X 3. Psych:  Good affect, responds appropriately   LABS: Basic Metabolic Panel: Recent Labs    12/10/20 0733 12/10/20 1433 12/11/20 0423 12/11/20 1608 12/11/20 2256 12/12/20 0208  NA 127*   < > 132* 131* 125* 132*  K 2.8*  --  4.2  --   --  4.3  CL 98  --  108  --   --  104  CO2 22  --  22  --   --  22  GLUCOSE 126*  --  119*  --   --  158*  BUN 13  --  15  --   --  15  CREATININE 0.75  --  0.88  --   --  0.81  CALCIUM 7.6*  --  7.7*  --   --  7.7*  MG 2.2  --   --   --   --  2.1  PHOS 3.2  3.1   < > 2.5 2.4*  --  3.5   < > = values in this interval not displayed.   Liver Function Tests: Recent Labs    12/10/20 0733 12/11/20 0441  AST  --  43*  ALT  --  22  ALKPHOS  --  57  BILITOT  --  0.3  PROT  --  5.5*  ALBUMIN 2.9* 2.7*   No results for input(s): LIPASE, AMYLASE in the last 72 hours. CBC: Recent Labs    12/11/20 0423 12/12/20 0327  WBC 8.3 12.1*  HGB 10.2* 10.7*  HCT 28.2* 30.1*  MCV 90.4 95.0  PLT 199 208    Cardiac Enzymes: Recent Labs    12/09/20 1516 12/10/20 0733  CKTOTAL 462* 2,632*   BNP: Invalid input(s): POCBNP D-Dimer: No results for input(s): DDIMER in the last 72 hours. Hemoglobin A1C: No results for input(s): HGBA1C in the last 72 hours. Fasting Lipid Panel: Recent Labs    12/10/20 0733  TRIG 200*   Thyroid Function Tests: No results for input(s): TSH, T4TOTAL, T3FREE, THYROIDAB in the last 72 hours.  Invalid input(s): FREET3 Anemia Panel: No results for input(s): VITAMINB12, FOLATE, FERRITIN, TIBC, IRON, RETICCTPCT in the last 72 hours.  CT HEAD WO CONTRAST (14/01/22)  Result Date: 12/10/2020 CLINICAL DATA:  Mental status change. EXAM: CT HEAD WITHOUT CONTRAST TECHNIQUE: Contiguous axial images were obtained from the base of the skull  through the vertex without intravenous contrast. 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 Small chronic infarct right anterior frontal lobe. Chronic microvascular ischemic changes are present bilaterally unchanged from the prior study Negative for acute infarct, hemorrhage, mass Vascular: Negative for hyperdense vessel Skull: Negative Sinuses/Orbits: Negative Other: None IMPRESSION: No acute abnormality no change from the recent CT. Chronic ischemic changes in the right frontal and right parietal lobe. Chronic microvascular ischemic changes. Electronically Signed   By: Franchot Gallo M.D.   On: 12/10/2020 13:24   US RENAL  Result Date: 12/10/2020 CLINICAL DATA:  Acute urinary retention. EXAM: RENAL / URINARY TRACT ULTRASOUND COMPLETE COMPARISON:  None. FINDINGS: Right Kidney: Renal measurements: 11.1 x 4.8 x 5.0 = volume: 141 mL. Echogenicity within normal limits. No mass or hydronephrosis visualized. Left Kidney: Renal measurements: 8.1 x 5.1 x 4.4 = volume: 96 mL. Echogenicity within normal limits. No mass or hydronephrosis visualized, evaluation is however limited due to bowel gas shadowing.  Bladder: Urinary bladder is collapsed about the for his catheter. Other: None. IMPRESSION: 1. No evidence of nephrolithiasis or hydronephrosis. Limited evaluation of the left kidney due to bowel gas shadowing. 2.  Urinary bladder is collapsed about the Foley's catheter. Electronically Signed   By: Keane Police D.O.   On: 12/10/2020 09:39   DG Chest Port 1 View  Result Date: 12/12/2020 CLINICAL DATA:  Respiratory failure and hypoxia. EXAM: PORTABLE CHEST 1 VIEW COMPARISON:  The portable chest yesterday at 12:01 p.m. FINDINGS: 0423 hours, 12/12/2020. Endotracheal tube has been advanced with tip in the proximal orifice of the left main bronchus and needs to be withdrawn 4 cm to a mid tracheal position. Right PICC has been placed and the tip is at the cavoatrial junction. NGT enters the stomach, the tip directed left in body of stomach. The lungs expiratory with atelectasis or consolidation in the bases. No pleural effusion is seen. The cardiac size is normal. Overall aeration seems unchanged. IMPRESSION: 1. ETT tip is in the upper most left main bronchus and needs to be withdrawn 4 cm 2 mid tracheal position. 2. Appears to have adequate insertion of the NGT, right PICC. 3. Expiratory lung volumes with atelectasis or consolidation in the bases. Electronically Signed   By: Telford Nab M.D.   On: 12/12/2020 05:41   DG Chest Port 1 View  Result Date: 12/11/2020 CLINICAL DATA:  Encounter for intubation and orogastric tube placement. EXAM: PORTABLE CHEST 1 VIEW COMPARISON:  Radiographs earlier the same date and 12/09/2020. CT 09/07/2020. FINDINGS: 1201 hours. Tip of the endotracheal tube is lower, within 1 cm of the carina and should be pulled back 3-4 cm for more optimal positioning. Enteric tube projects below the diaphragm, tip overlying the proximal stomach. The heart size and mediastinal contours are stable with aortic atherosclerosis. There are mildly increased perihilar opacities in both lungs which may  reflect atelectasis, inflammation or edema. No confluent airspace opacity, pneumothorax or large pleural effusion. Multiple telemetry leads overlie the chest. IMPRESSION: 1. Tip of the endotracheal tube is within 1 cm of the carina and should be pulled back 3-4 cm. 2. Mildly increased perihilar airspace opacities bilaterally. Electronically Signed   By: Richardean Sale M.D.   On: 12/11/2020 12:16   DG Chest Port 1 View  Result Date: 12/11/2020 CLINICAL DATA:  Acute respiratory failure with hypoxia EXAM: PORTABLE CHEST 1 VIEW COMPARISON:  Radiograph 12/09/2020 FINDINGS: Endotracheal tube has been retracted since the prior exam, tip now  overlies the midthoracic trachea. Nasogastric tube side port overlies the body of the stomach. Unchanged cardiomediastinal silhouette. There are faint airspace opacities in the right mid to lower lung. No pleural effusion. No visible pneumothorax. Bones are unchanged. Unchanged metallic wires overlying the stomach. IMPRESSION: Appropriate positioning of endotracheal tube and orogastric tube. Faint airspace opacities in the right mid to lower lung, possibly developing infection. Electronically Signed   By: Caprice Renshaw M.D.   On: 12/11/2020 09:12   ECHOCARDIOGRAM COMPLETE  Result Date: 12/12/2020    ECHOCARDIOGRAM REPORT   Patient Name:   Ryan Mayer Date of Exam: 12/10/2020 Medical Rec #:  [id number]    Height:       60.0 in Accession #:    [id number]   Weight:       149.3 lb Date of Birth:  [birth year]   BSA:          1.648 m Patient Age:    65 years     BP:           90/62 mmHg Patient Gender: M            HR:           59 bpm. Exam Location:  [facility] Procedure: 2D Echo, Color Doppler and Cardiac Doppler Indications:     R06.00 Dyspnea  History:         Patient has no prior history of Echocardiogram examinations.                  Risk Factors:Hypertension.  Sonographer:     Humphrey Rolls Referring Phys:  [id number] Boyce Medici GONFA Diagnosing Phys: Alwyn Pea MD  Sonographer  Comments: Echo performed with patient supine and on artificial respirator. IMPRESSIONS  1. Left ventricular ejection fraction, by estimation, is 50 to 55%. The left ventricle has low normal function. The left ventricle has no regional wall motion abnormalities. Left ventricular diastolic parameters are consistent with Grade I diastolic dysfunction (impaired relaxation).  2. Right ventricular systolic function is normal. The right ventricular size is normal.  3. The mitral valve is normal in structure. No evidence of mitral valve regurgitation.  4. The aortic valve is grossly normal. Aortic valve regurgitation is not visualized. FINDINGS  Left Ventricle: Left ventricular ejection fraction, by estimation, is 50 to 55%. The left ventricle has low normal function. The left ventricle has no regional wall motion abnormalities. The left ventricular internal cavity size was normal in size. There is no left ventricular hypertrophy. Left ventricular diastolic parameters are consistent with Grade I diastolic dysfunction (impaired relaxation). Right Ventricle: The right ventricular size is normal. Right vetricular wall thickness was not well visualized. Right ventricular systolic function is normal. Left Atrium: Left atrial size was normal in size. Right Atrium: Right atrial size was normal in size. Pericardium: There is no evidence of pericardial effusion. Mitral Valve: The mitral valve is normal in structure. No evidence of mitral valve regurgitation. MV peak gradient, 2.0 mmHg. The mean mitral valve gradient is 1.0 mmHg. Tricuspid Valve: The tricuspid valve is normal in structure. Tricuspid valve regurgitation is trivial. Aortic Valve: The aortic valve is grossly normal. Aortic valve regurgitation is not visualized. Aortic valve mean gradient measures 5.0 mmHg. Aortic valve peak gradient measures 10.0 mmHg. Aortic valve area, by VTI measures 3.31 cm. Pulmonic Valve: The pulmonic valve was grossly normal. Pulmonic valve  regurgitation is not visualized. Aorta: The ascending aorta was not well visualized. IAS/Shunts: No atrial level shunt detected by  color flow Doppler.  LEFT VENTRICLE PLAX 2D LVIDd:         4.15 cm   Diastology LVIDs:         3.05 cm   LV e' medial:    6.53 cm/s LV PW:         1.14 cm   LV E/e' medial:  7.7 LV IVS:        0.80 cm   LV e' lateral:   9.03 cm/s LVOT diam:     2.20 cm   LV E/e' lateral: 5.5 LV SV:         86 LV SV Index:   52 LVOT Area:     3.80 cm  RIGHT VENTRICLE RV Basal diam:  3.12 cm RV S prime:     11.20 cm/s LEFT ATRIUM           Index        RIGHT ATRIUM           Index LA diam:      2.80 cm 1.70 cm/m   RA Area:     12.80 cm LA Vol (A2C): 22.9 ml 13.89 ml/m  RA Volume:   28.90 ml  17.53 ml/m LA Vol (A4C): 18.8 ml 11.41 ml/m  AORTIC VALVE                     PULMONIC VALVE AV Area (Vmax):    2.81 cm      PV Vmax:       1.06 m/s AV Area (Vmean):   2.63 cm      PV Vmean:      66.000 cm/s AV Area (VTI):     3.31 cm      PV VTI:        0.178 m AV Vmax:           158.00 cm/s   PV Peak grad:  4.5 mmHg AV Vmean:          110.000 cm/s  PV Mean grad:  2.0 mmHg AV VTI:            0.261 m AV Peak Grad:      10.0 mmHg AV Mean Grad:      5.0 mmHg LVOT Vmax:         117.00 cm/s LVOT Vmean:        76.000 cm/s LVOT VTI:          0.227 m LVOT/AV VTI ratio: 0.87  AORTA Ao Root diam: 3.20 cm MITRAL VALVE               TRICUSPID VALVE MV Area (PHT): 2.48 cm    TR Peak grad:   20.8 mmHg MV Area VTI:   3.40 cm    TR Vmax:        228.00 cm/s MV Peak grad:  2.0 mmHg MV Mean grad:  1.0 mmHg    SHUNTS MV Vmax:       0.71 m/s    Systemic VTI:  0.23 m MV Vmean:      40.1 cm/s   Systemic Diam: 2.20 cm MV Decel Time: 306 msec MV E velocity: 50.10 cm/s MV A velocity: 57.40 cm/s MV E/A ratio:  0.87 Mateen Franssen D Elayne Gruver MD Electronically signed by Yolonda Kida MD Signature Date/Time: 12/12/2020/6:31:01 AM    Final    [place] EKG SITE RITE  Result Date: 12/11/2020 If Site Rite image not attached, placement could not be  confirmed  due to current cardiac rhythm.    Echo preserved left ventricular function  TELEMETRY: Normal sinus rhythm around 70:  ASSESSMENT AND PLAN:  Principal Problem:   Acute hyponatremia Active Problems:   Intellectual disability   Epilepsy (HCC)   Hypertensive urgency   Agitation   Hypokalemia    Plan Patient intubated sedated continue critical care management Hypotension continue supportive care ICU Possible non-STEMI troponins over thousand echocardiogram preserved left ventricular function not a good Candidate discharge Continue hypertension management and control Elevated troponin probably demand ischemia DVT prophylaxis only not ACS  May still need an ischemia work-up at a later date possibly after patient extubated or even as an outpatient 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

## 2020-12-12 NOTE — Progress Notes (Signed)
GOALS OF CARE DISCUSSION  The Clinical status was relayed to Ryan Mayer Legal Gaurdian  Updated and notified of patients medical condition.    Patient remains unresponsive and will not open eyes to command.   Patient is having a weak cough and struggling to remove secretions.   Patient with increased WOB and using accessory muscles to breathe Explained to family course of therapy and the modalities    Patient with Progressive multiorgan failure with a very high probablity of a very minimal chance of meaningful recovery despite all aggressive and optimal medical therapy.   The Group Home Team have consented and agreed to   DNR/DNI.   Family are satisfied with Plan of action and management. All questions answered  Additional CC time 35 mins   Ryan Mayer [name] Glad, M.D.  [facility]  Medical Director [facility] Medical Director [facility] Cardio-Pulmonary Department

## 2020-12-13 DIAGNOSIS — E871 Hypo-osmolality and hyponatremia: Secondary | ICD-10-CM | POA: Diagnosis not present

## 2020-12-13 LAB — GLUCOSE, CAPILLARY
Glucose-Capillary: 101 mg/dL — ABNORMAL HIGH (ref 70–99)
Glucose-Capillary: 113 mg/dL — ABNORMAL HIGH (ref 70–99)
Glucose-Capillary: 120 mg/dL — ABNORMAL HIGH (ref 70–99)
Glucose-Capillary: 123 mg/dL — ABNORMAL HIGH (ref 70–99)
Glucose-Capillary: 126 mg/dL — ABNORMAL HIGH (ref 70–99)
Glucose-Capillary: 131 mg/dL — ABNORMAL HIGH (ref 70–99)
Glucose-Capillary: 134 mg/dL — ABNORMAL HIGH (ref 70–99)

## 2020-12-13 LAB — CBC
HCT: 28.1 % — ABNORMAL LOW (ref 39.0–52.0)
Hemoglobin: 9.6 g/dL — ABNORMAL LOW (ref 13.0–17.0)
MCH: 32.9 pg (ref 26.0–34.0)
MCHC: 34.2 g/dL (ref 30.0–36.0)
MCV: 96.2 fL (ref 80.0–100.0)
Platelets: 181 10*3/uL (ref 150–400)
RBC: 2.92 MIL/uL — ABNORMAL LOW (ref 4.22–5.81)
RDW: 13.1 % (ref 11.5–15.5)
WBC: 7.6 10*3/uL (ref 4.0–10.5)
nRBC: 0 % (ref 0.0–0.2)

## 2020-12-13 LAB — TRIGLYCERIDES: Triglycerides: 198 mg/dL — ABNORMAL HIGH (ref <150)

## 2020-12-13 LAB — PHOSPHORUS: Phosphorus: 3 mg/dL (ref 2.5–4.6)

## 2020-12-13 LAB — BASIC METABOLIC PANEL
Anion gap: 4 — ABNORMAL LOW (ref 5–15)
BUN: 16 mg/dL (ref 8–23)
CO2: 25 mmol/L (ref 22–32)
Calcium: 7.5 mg/dL — ABNORMAL LOW (ref 8.9–10.3)
Chloride: 106 mmol/L (ref 98–111)
Creatinine, Ser: 0.77 mg/dL (ref 0.61–1.24)
GFR, Estimated: >60 mL/min (ref >60)
Glucose, Bld: 152 mg/dL — ABNORMAL HIGH (ref 70–99)
Potassium: 4.1 mmol/L (ref 3.5–5.1)
Sodium: 135 mmol/L (ref 135–145)

## 2020-12-13 LAB — MAGNESIUM: Magnesium: 2.1 mg/dL (ref 1.7–2.4)

## 2020-12-13 LAB — SODIUM
Sodium: 139 mmol/L (ref 135–145)
Sodium: 138 mmol/L (ref 135–145)

## 2020-12-13 MED ORDER — SODIUM CHLORIDE 0.9 % IV SOLN
2.0000 g | Freq: Three times a day (TID) | INTRAVENOUS | Status: DC
  Administered 2020-12-13 – 2020-12-14 (×3): 2 g via INTRAVENOUS
  Filled 2020-12-13 (×5): qty 2

## 2020-12-13 NOTE — Progress Notes (Signed)
[facility] Cardiology    SUBJECTIVE: Intubated sedated   Vitals:   12/13/20 0500 12/13/20 0530 12/13/20 0557 12/13/20 0600  BP: 116/66 (!) 100/57  (!) 100/56  Pulse: 61 64 62 62  Resp: 16 16 16 16   Temp: (!) 96.3 F (35.7 C) (!) 96.4 F (35.8 C) (!) 96.4 F (35.8 C) (!) 96.4 F (35.8 C)  TempSrc:      SpO2: 96% 96% 96% 96%  Weight:      Height:         Intake/Output Summary (Last 24 hours) at 12/13/2020 14/04/2020 Last data filed at 12/13/2020 0400 Gross per 24 hour  Intake 1897.13 ml  Output 1925 ml  Net -27.87 ml      PHYSICAL EXAM  General: Well developed, well nourished, in no acute distress HEENT:  Normocephalic and atramatic Neck:  No JVD.  Lungs: Clear bilaterally to auscultation and percussion. Heart: HRRR . Normal S1 and S2 without gallops or murmurs.  Abdomen: Bowel sounds are positive, abdomen soft and non-tender  Msk:  Back normal, normal gait. Normal strength and tone for age. Extremities: No clubbing, cyanosis or edema.   Neuro: Intubated sedated Psych: Intubated sedated   LABS: Basic Metabolic Panel: Recent Labs    12/12/20 0208 12/12/20 1156 12/12/20 2012 12/13/20 0341  NA 132*   < > 134* 135  K 4.3  --   --  4.1  CL 104  --   --  106  CO2 22  --   --  25  GLUCOSE 158*  --   --  152*  BUN 15  --   --  16  CREATININE 0.81  --   --  0.77  CALCIUM 7.7*  --   --  7.5*  MG 2.1  --   --  2.1  PHOS 3.5  --   --  3.0   < > = values in this interval not displayed.   Liver Function Tests: Recent Labs    12/11/20 0441  AST 43*  ALT 22  ALKPHOS 57  BILITOT 0.3  PROT 5.5*  ALBUMIN 2.7*   No results for input(s): LIPASE, AMYLASE in the last 72 hours. CBC: Recent Labs    12/12/20 0327 12/13/20 0341  WBC 12.1* 7.6  HGB 10.7* 9.6*  HCT 30.1* 28.1*  MCV 95.0 96.2  PLT 208 181   Cardiac Enzymes: No results for input(s): CKTOTAL, CKMB, CKMBINDEX, TROPONINI in the last 72 hours. BNP: Invalid input(s): POCBNP D-Dimer: No results for input(s):  DDIMER in the last 72 hours. Hemoglobin A1C: No results for input(s): HGBA1C in the last 72 hours. Fasting Lipid Panel: Recent Labs    12/13/20 0341  TRIG 198*   Thyroid Function Tests: No results for input(s): TSH, T4TOTAL, T3FREE, THYROIDAB in the last 72 hours.  Invalid input(s): FREET3 Anemia Panel: No results for input(s): VITAMINB12, FOLATE, FERRITIN, TIBC, IRON, RETICCTPCT in the last 72 hours.  DG Chest Port 1 View  Result Date: 12/12/2020 CLINICAL DATA:  Respiratory failure and hypoxia. EXAM: PORTABLE CHEST 1 VIEW COMPARISON:  The portable chest yesterday at 12:01 p.m. FINDINGS: 0423 hours, 12/12/2020. Endotracheal tube has been advanced with tip in the proximal orifice of the left main bronchus and needs to be withdrawn 4 cm to a mid tracheal position. Right PICC has been placed and the tip is at the cavoatrial junction. NGT enters the stomach, the tip directed left in body of stomach. The lungs expiratory with atelectasis or consolidation in the bases.  No pleural effusion is seen. The cardiac size is normal. Overall aeration seems unchanged. IMPRESSION: 1. ETT tip is in the upper most left main bronchus and needs to be withdrawn 4 cm 2 mid tracheal position. 2. Appears to have adequate insertion of the NGT, right PICC. 3. Expiratory lung volumes with atelectasis or consolidation in the bases. Electronically Signed   By: Almira Bar M.D.   On: 12/12/2020 05:41   DG Chest Port 1 View  Result Date: 12/11/2020 CLINICAL DATA:  Encounter for intubation and orogastric tube placement. EXAM: PORTABLE CHEST 1 VIEW COMPARISON:  Radiographs earlier the same date and 12/09/2020. CT 09/07/2020. FINDINGS: 1201 hours. Tip of the endotracheal tube is lower, within 1 cm of the carina and should be pulled back 3-4 cm for more optimal positioning. Enteric tube projects below the diaphragm, tip overlying the proximal stomach. The heart size and mediastinal contours are stable with aortic  atherosclerosis. There are mildly increased perihilar opacities in both lungs which may reflect atelectasis, inflammation or edema. No confluent airspace opacity, pneumothorax or large pleural effusion. Multiple telemetry leads overlie the chest. IMPRESSION: 1. Tip of the endotracheal tube is within 1 cm of the carina and should be pulled back 3-4 cm. 2. Mildly increased perihilar airspace opacities bilaterally. Electronically Signed   By: Carey Bullocks M.D.   On: 12/11/2020 12:16   [place] EKG SITE RITE  Result Date: 12/11/2020 If Site Rite image not attached, placement could not be confirmed due to current cardiac rhythm.    Echo preserved left ventricular function around 50  TELEMETRY: Normal sinus rhythm rate around 60:  ASSESSMENT AND PLAN:  Principal Problem:   Acute hyponatremia Active Problems:   Intellectual disability   Epilepsy (HCC)   Hypertensive urgency   Agitation   Hypokalemia    Plan Continue ventilatory support wean when possible Continue Coreg electrolytes including hyponatremia Agree with seizure therapy Continue hypertension management and control Pressures as necessary to help with blood pressure support sinus rhythm nonspecific findings   Alwyn Pea, MD 12/13/2020 9:18 AM

## 2020-12-13 NOTE — Plan of Care (Addendum)
12/13/20, 2145 hrs: Patient scheduled for Thoracentesis 12/5. Per Dr. Toniann Fail, do not hold tube feeds at midnight. Tube feeds continued overnight as instructed.   Problem: Education: Goal: Knowledge of General Education information will improve Description: Including pain rating scale, medication(s)/side effects and non-pharmacologic comfort measures Outcome: Not Progressing   Problem: Health Behavior/Discharge Planning: Goal: Ability to manage health-related needs will improve Outcome: Not Progressing   Problem: Clinical Measurements: Goal: Ability to maintain clinical measurements within normal limits will improve Outcome: Not Progressing Goal: Will remain free from infection Outcome: Not Progressing Goal: Diagnostic test results will improve Outcome: Not Progressing Goal: Respiratory complications will improve Outcome: Not Progressing Goal: Cardiovascular complication will be avoided Outcome: Not Progressing   Problem: Activity: Goal: Risk for activity intolerance will decrease Outcome: Not Progressing   Problem: Nutrition: Goal: Adequate nutrition will be maintained Outcome: Not Progressing   Problem: Coping: Goal: Level of anxiety will decrease Outcome: Not Progressing   Problem: Elimination: Goal: Will not experience complications related to bowel motility Outcome: Not Progressing Goal: Will not experience complications related to urinary retention Outcome: Not Progressing   Problem: Pain Managment: Goal: General experience of comfort will improve Outcome: Not Progressing   Problem: Safety: Goal: Ability to remain free from injury will improve Outcome: Not Progressing   Problem: Skin Integrity: Goal: Risk for impaired skin integrity will decrease Outcome: Not Progressing

## 2020-12-13 NOTE — Progress Notes (Signed)
NAME:  GHALI MORISSETTE, MRN:  [id number], DOB:  [birth year], LOS: 4 ADMISSION DATE:  12/06/2020, CONSULTATION DATE:  12/09/2020 REFERRING MD:  Dr. Cyndia Skeeters, CHIEF COMPLAINT:  Acute Respiratory Distress   Brief Pt Description / Synopsis:  65 yo with PMH of intellectual disability, agitation, epilepsy admitted with Hypertensive Urgency, severe acute Hyponatremia (? SIADH), agitation, and elevated troponin (demand ischemia vs NSTEMI). Developed Acute Hypoxic Respiratory Failure on 11/30 due to aspiration of bloody secretions leading to asphyxia requiring emergent intubation.  Pertinent  Medical History  Intellectual disability Seizures Hypertension  Micro Data:  11/30/2020: SARS-CoV-2 and influenza PCR>> negative 12/09/2020: MRSA PCR>> negative 12/11/2020: Tracheal aspirate>>  Antimicrobials:  Unasyn 12/2>>  Significant Hospital Events: Including procedures, antibiotic start and stop dates in addition to other pertinent events   11/29: Admitted by Hospitalist 11/30 Developed acute hypoxic respiratory failure due to aspiration, emergently intubated,  12/1: remains on vent, severe resp failure 12/2: Extubated.  However required reintubation shortly after due to concern for aspiration and hypoxia.  Unasyn started.  S/p bronch and PICC placement 12/3: remains on vent,+aspiration pneumonia  Interim History / Subjective:  Severe agitation Remains on vent Remains critically ill -Remains on  Levophed   Objective   Blood pressure (!) 100/56, pulse 62, temperature (!) 96.4 F (35.8 C), resp. rate 16, height 5' (1.524 m), weight 68.6 kg, SpO2 96 %.    Vent Mode: PRVC FiO2 (%):  [24 %] 24 % Set Rate:  [16 bmp] 16 bmp Vt Set:  [450 mL] 450 mL PEEP:  [5 cmH20] 5 cmH20 Plateau Pressure:  [14 cmH20] 14 cmH20   Intake/Output Summary (Last 24 hours) at 12/13/2020 0718 Last data filed at 12/13/2020 0400 Gross per 24 hour  Intake 2079.83 ml  Output 2025 ml  Net 54.83 ml    Filed Weights    12/11/20 0403 12/12/20 0325 12/13/20 0103  Weight: 67.4 kg 67.1 kg 68.6 kg    REVIEW OF SYSTEMS  PATIENT IS UNABLE TO PROVIDE COMPLETE REVIEW OF SYSTEMS DUE TO SEVERE CRITICAL ILLNESS AND TOXIC METABOLIC ENCEPHALOPATHY    PHYSICAL EXAMINATION:  GENERAL:critically ill appearing, +resp distress EYES: Pupils equal, round, reactive to light.  No scleral icterus.  MOUTH: Moist mucosal membrane. INTUBATED NECK: Supple.  PULMONARY: +rhonchi, +wheezing CARDIOVASCULAR: S1 and S2.  No murmurs  GASTROINTESTINAL: Soft, nontender, -distended. Positive bowel sounds.  MUSCULOSKELETAL: No swelling, clubbing, or edema.  NEUROLOGIC: obtunded SKIN:intact,warm,dry    Assessment & Plan:   65 yo with PMH of intellectual disability, agitation, epilepsy admitted with Hypertensive Urgency, severe acute Hyponatremia (? SIADH), agitation, and elevated troponin (demand ischemia vs NSTEMI). Developed Acute Hypoxic Respiratory Failure on 11/30 due to aspiration of bloody secretions leading to asphyxia requiring emergent intubation with aspiration pneumonia  Severe ACUTE Hypoxic and Hypercapnic Respiratory Failure -continue Mechanical Ventilator support -continue Bronchodilator Therapy -Wean Fio2 and PEEP as tolerated -VAP/VENT bundle implementation -will perform SAT/SBT when respiratory parameters are met May consider bronch   shock SOURCE-pneumonia, hypovolemia -use vasopressors to keep MAP>65 as needed -follow ABG and LA as needed -follow up cultures -emperic ABX -consider stress dose steroids -aggressive IV fluid Resuscitation  CARDIAC Elevated Troponin, demand ischemia vs NSTEMI Hypertensive Urgency (earlier in hospital stay) -Continuous cardiac monitoring -Maintain MAP >65 -IV fluids -Vasopressors as needed to maintain MAP goal -Trend lactic acid until normalized -Echocardiogram noted -Cardiology following, appreciate input -Continue ASA 81 mg daily -Per Cardiology can hold Heparin  gtt (suspect demand ischemia) -Hold antihypertensives due to requirement for vasopressors  INFECTIOUS DISEASE -continue antibiotics as prescribed -follow up cultures Concern for Aspiration Pneumonia -Monitor fever curve -Trend WBC's & Procalcitonin -Follow cultures as above -Continue empiric Unasyn pending cultures & sensitivities  Hyponatremia, ? SIADH (pt is on Tegretol and multiple antipsychotic medications) -Monitor I&O's / urinary output -Follow BMP -Ensure adequate renal perfusion -Avoid nephrotoxic agents as able -Replace electrolytes as indicated   NEUROLOGY ACUTE TOXIC METABOLIC ENCEPHALOPATHY Intellectual disability/agitation Sedation needs in setting of mechanical ventilation PMHx: Seizure disorder/epilepsy -Maintain a RASS goal of 0 to -1 -Fentanyl and Propofol drips as needed to maintain RASS goal -Avoid sedating medications as able -Daily wake up assessment -CT Head 12/1 negative for acute intracranial abnormality -Continue Tegretol -Prn Benzodiazepines   Best Practice (right click and "Reselect all SmartList Selections" daily)   Diet/type: tubefeeds and NPO DVT prophylaxis: SCD GI prophylaxis: PPI Lines: N/A Foley:  Yes, and it is still needed Code Status: DNR status Last date of multidisciplinary goals of care discussion [12/11/2020]  Labs   CBC: Recent Labs  Lab 11/26/2020 1804 12/10/20 0733 12/11/20 0423 12/12/20 0327 12/13/20 0341  WBC 5.6 8.1 8.3 12.1* 7.6  NEUTROABS 3.3  --   --   --   --   HGB 13.3 9.9* 10.2* 10.7* 9.6*  HCT 35.8* 26.8* 28.2* 30.1* 28.1*  MCV 87.3 86.2 90.4 95.0 96.2  PLT 230 198 199 208 181     Basic Metabolic Panel: Recent Labs  Lab 11/13/2020 1804 12/09/20 0135 12/09/20 0529 12/09/20 1459 12/09/20 2247 12/10/20 0733 12/10/20 1433 12/10/20 2256 12/11/20 0423 12/11/20 1608 12/11/20 2256 12/12/20 0208 12/12/20 1156 12/12/20 2012 12/13/20 0341  NA 122*   < > 122* 122*   < > 127* 126*   < > 132* 131* 125*  132* 133* 134* 135  K 3.3*   < >  --  3.0*  --  2.8*  --   --  4.2  --   --  4.3  --   --  4.1  CL 91*   < >  --  97*  --  98  --   --  108  --   --  104  --   --  106  CO2 24   < >  --  18*  --  22  --   --  22  --   --  22  --   --  25  GLUCOSE 103*   < >  --  136*  --  126*  --   --  119*  --   --  158*  --   --  152*  BUN 14   < >  --  14  --  13  --   --  15  --   --  15  --   --  16  CREATININE 0.78   < >  --  0.75  --  0.75  --   --  0.88  --   --  0.81  --   --  0.77  CALCIUM 8.7*   < >  --  7.7*  --  7.6*  --   --  7.7*  --   --  7.7*  --   --  7.5*  MG 1.9  --  1.4*  --   --  2.2  --   --   --   --   --  2.1  --   --  2.1  PHOS  --   --   --   --    < >  3.2  3.1 2.9  --  2.5 2.4*  --  3.5  --   --  3.0   < > = values in this interval not displayed.    GFR: Estimated Creatinine Clearance: 76.7 mL/min (by C-G formula based on SCr of 0.77 mg/dL). Recent Labs  Lab 12/10/20 0733 12/11/20 0423 12/12/20 0327 12/13/20 0341  WBC 8.1 8.3 12.1* 7.6     Liver Function Tests: Recent Labs  Lab 11/16/2020 1804 12/10/20 0733 12/11/20 0441  AST 16  --  43*  ALT 15  --  22  ALKPHOS 84  --  57  BILITOT 0.7  --  0.3  PROT 7.4  --  5.5*  ALBUMIN 4.0 2.9* 2.7*    No results for input(s): LIPASE, AMYLASE in the last 168 hours. No results for input(s): AMMONIA in the last 168 hours.  ABG    Component Value Date/Time   PHART 7.36 12/09/2020 1835   PCO2ART 42 12/09/2020 1835   PO2ART 136 (H) 12/09/2020 1835   HCO3 23.7 12/09/2020 1835   ACIDBASEDEF 1.7 12/09/2020 1835   O2SAT 99.0 12/09/2020 1835      Coagulation Profile: No results for input(s): INR, PROTIME in the last 168 hours.  Cardiac Enzymes: Recent Labs  Lab 12/09/20 1516 12/10/20 0733  CKTOTAL 462* 2,632*     HbA1C: No results found for: HGBA1C  CBG: Recent Labs  Lab 12/12/20 1550 12/12/20 1907 12/12/20 2306 12/12/20 2348 12/13/20 0308  GLUCAP 156* 143* 118* 127* 131*    Home Medications  Prior  to Admission medications   Medication Sig Start Date End Date Taking? Authorizing Provider  acetaZOLAMIDE ER (DIAMOX) 500 MG capsule Take 500 mg by mouth every other day. 12/07/20  Yes [provider]  amLODipine (NORVASC) 5 MG tablet Take 5 mg by mouth daily. 09/10/19  Yes [provider]  aspirin EC 81 MG tablet Take 81 mg by mouth daily. Swallow whole.   Yes [provider]  atropine 1 % ophthalmic solution Place 1 drop into the left eye 6 (six) times daily.   Yes [provider]  brimonidine (ALPHAGAN) 0.2 % ophthalmic solution Place 1 drop into the left eye 3 (three) times daily.   Yes [provider]  carbamazepine (TEGRETOL) 200 MG tablet Take 200 mg by mouth 3 (three) times daily. 09/10/19  Yes [provider]  carvedilol (COREG) 25 MG tablet Take 25 mg by mouth 2 (two) times daily with a meal.   Yes [provider]  cetirizine (ZYRTEC) 10 MG tablet Take 10 mg by mouth 2 (two) times daily.   Yes [provider]  cloNIDine (CATAPRES) 0.1 MG tablet Take 0.1 mg by mouth 3 (three) times daily. 0700, NOON, 1800   Yes [provider]  docusate sodium (COLACE) 100 MG capsule Take 100 mg by mouth 3 (three) times daily as needed for mild constipation or moderate constipation.   Yes [provider]  dorzolamide-timolol (COSOPT) 22.3-6.8 MG/ML ophthalmic solution Place 1 drop into the left eye 2 (two) times daily.   Yes [provider]  Emollient (CETAPHIL) cream Apply 1 application topically as directed. Apply after bath   Yes [provider]  escitalopram (LEXAPRO) 10 MG tablet Take 10 mg by mouth daily. 09/10/19  Yes [provider]  fesoterodine (TOVIAZ) 8 MG TB24 tablet Take 8 mg by mouth daily.   Yes [provider]  fluticasone (FLONASE) 50 MCG/ACT nasal spray Place 1 spray into both nostrils  2 (two) times daily. 09/19/19  Yes [provider]  lisinopril (ZESTRIL) 20  MG tablet Take 20 mg by mouth daily. 11/07/20  Yes [provider]  lisinopril-hydrochlorothiazide (ZESTORETIC) 20-12.5 MG tablet Take 1 tablet by mouth daily. 12/07/20  Yes [provider]  LORazepam (ATIVAN) 1 MG tablet Take 1 mg by mouth in the morning and at bedtime. (1700 & 2000)   Yes [provider]  pantoprazole (PROTONIX) 40 MG tablet Take 40 mg by mouth daily. 09/10/19  Yes [provider]  prednisoLONE acetate (PRED FORTE) 1 % ophthalmic suspension Place 1 drop into the left eye 6 (six) times daily.   Yes [provider]  risperiDONE (RISPERDAL) 0.5 MG tablet Take 1 mg by mouth See admin instructions. Take 2 tablets (1 mg) by mouth every morning and 2 tablets (1 mg) at bedtime   Yes [provider]  acetaminophen (TYLENOL) 325 MG tablet Take 650 mg by mouth every 4 (four) hours as needed for fever.    [provider]  bisacodyl (DULCOLAX) 5 MG EC tablet Take 10 mg by mouth every 12 (twelve) hours as needed for severe constipation.    [provider]  camphor-menthol Timoteo Ace) lotion Apply 1 application topically as needed for itching.    [provider]  carbamide peroxide (DEBROX) 6.5 % OTIC solution Place 5-10 drops into both ears every Friday.    [provider]  chlorhexidine (PERIDEX) 0.12 % solution Use as directed 5 mLs in the mouth or throat 2 (two) times daily.    [provider]  diphenhydrAMINE (BENADRYL) 25 mg capsule Take 25 mg by mouth every 6 (six) hours as needed.    [provider]  guaiFENesin (ROBITUSSIN) 100 MG/5ML SOLN Take 5 mLs by mouth every 4 (four) hours as needed for cough.    [provider]  hydrOXYzine (ATARAX/VISTARIL) 50 MG tablet Take 50 mg by mouth 3 (three) times daily as needed for itching or anxiety.    [provider]  latanoprost (XALATAN) 0.005 % ophthalmic solution Place 1 drop into the left eye at bedtime.    [provider]  levocetirizine (XYZAL) 5 MG tablet Take 5 mg by mouth at bedtime.  09/10/19   [provider]  loperamide (IMODIUM) 2 MG capsule Take 2-4 mg by mouth See admin instructions. Take 2 capsules (67m) by mouth after first loose stool then take 1 capsule (265m by mouth after each additional loose stool (PRN)    [provider]  LORazepam (ATIVAN) 1 MG tablet Take 1 mg by mouth daily as needed for anxiety or sedation.    [provider]  metroNIDAZOLE (METROCREAM) 0.75 % cream Apply 1 application topically at bedtime. (Apply to face)    [provider]  naltrexone (DEPADE) 50 MG tablet Take 50 mg by mouth daily. Patient not taking: Reported on 12/09/2020    [provider]  neomycin-bacitracin-polymyxin (NEOSPORIN) ointment Apply 1 application topically daily as needed for wound care.    [provider]  OLANZapine (ZYPREXA) 10 MG tablet Take 10 mg by mouth at bedtime. 11/17/2020   [provider]  polyethylene glycol (MIRALAX / GLYCOLAX) 17 g packet Take 17 g by mouth daily.    [provider]  potassium chloride SA (KLOR-CON) 20 MEQ tablet Take 1 tablet (20 mEq total) by mouth daily. Patient not taking: Reported on 12/09/2020 10/02/20   DaEdwin DadaMD  tolnaftate (TINACTIN) 1 % cream Apply 1 application  topically 2 (two) times daily as needed (itchy feet).    [provider]  traZODone (DESYREL) 50 MG tablet Take 50-125 mg by mouth 3 (three) times daily. Take one 50 mg tablet by mouth every morning, one 50 mg tablet at 2 PM, and two and one-half tablets 125 mg at bedtime    [provider]   Scheduled Meds:  aspirin  243 mg Oral Once   aspirin  81 mg Per Tube Daily   atropine  1 drop Left Eye 6 X Daily   brimonidine  1 drop Left Eye TID   carbamazepine  200 mg Per Tube TID   chlorhexidine gluconate (MEDLINE KIT)  15 mL Mouth Rinse BID   Chlorhexidine Gluconate Cloth  6 each Topical Q0600    cloNIDine  0.1 mg Per Tube TID   docusate  100 mg Per Tube BID   dorzolamide-timolol  1 drop Left Eye BID   etomidate  20 mg Intravenous Once   feeding supplement (VITAL HIGH PROTEIN)  1,000 mL Per Tube Q24H   fentaNYL (SUBLIMAZE) injection  100 mcg Intravenous Once   [START ON 12/14/2020] fesoterodine  8 mg Oral Daily   free water  30 mL Per Tube Q4H   latanoprost  1 drop Left Eye QHS   loratadine  10 mg Per Tube Daily   LORazepam  1 mg Per Tube QHS   mouth rinse  15 mL Mouth Rinse 10 times per day   methylPREDNISolone (SOLU-MEDROL) injection  40 mg Intravenous Once   methylPREDNISolone (SOLU-MEDROL) injection  40 mg Intravenous Q12H   nitroGLYCERIN  0.5 inch Topical Q6H   OLANZapine  10 mg Per Tube QHS   pantoprazole (PROTONIX) IV  40 mg Intravenous Q24H   polyethylene glycol  17 g Per Tube Daily   sodium chloride flush  10-40 mL Intracatheter Q12H   traZODone  100 mg Per Tube QHS   Continuous Infusions:  sodium chloride Stopped (12/11/20 1912)   ampicillin-sulbactam (UNASYN) IV Stopped (12/13/20 0133)   fentaNYL infusion INTRAVENOUS 300 mcg/hr (12/13/20 0505)   lactated ringers 999 mL/hr at 12/09/20 1809   norepinephrine (LEVOPHED) Adult infusion 2 mcg/min (12/13/20 0400)   propofol (DIPRIVAN) infusion 70 mcg/kg/min (12/13/20 0556)   PRN Meds:.bisacodyl, fentaNYL, hydrOXYzine, midazolam, midazolam, sodium chloride flush, sodium phosphate    DVT/GI PRX  assessed I Assessed the need for Labs I Assessed the need for Foley I Assessed the need for Central Venous Line Family Discussion when available I Assessed the need for Mobilization I made an Assessment of medications to be adjusted accordingly Safety Risk assessment completed  CASE DISCUSSED IN MULTIDISCIPLINARY ROUNDS WITH ICU TEAM     Critical Care Time devoted to patient care services described in this note is 45  minutes.  Critical care was necessary to treat /prevent imminent and life-threatening  deterioration. Overall, patient is critically ill, prognosis is guarded.    Corrin Parker, M.D.  [facility]  Medical Director [facility] Director [facility] Cardio-Pulmonary Department

## 2020-12-13 NOTE — Progress Notes (Signed)
This RN assumed care of patient from [place], [place] at [phone].

## 2020-12-14 ENCOUNTER — Inpatient Hospital Stay: Payer: Medicare Other

## 2020-12-14 DIAGNOSIS — J9601 Acute respiratory failure with hypoxia: Secondary | ICD-10-CM | POA: Diagnosis not present

## 2020-12-14 DIAGNOSIS — I160 Hypertensive urgency: Secondary | ICD-10-CM

## 2020-12-14 DIAGNOSIS — G40909 Epilepsy, unspecified, not intractable, without status epilepticus: Secondary | ICD-10-CM | POA: Diagnosis not present

## 2020-12-14 DIAGNOSIS — E871 Hypo-osmolality and hyponatremia: Secondary | ICD-10-CM | POA: Diagnosis not present

## 2020-12-14 LAB — CBC
HCT: 27.5 % — ABNORMAL LOW (ref 39.0–52.0)
Hemoglobin: 9.2 g/dL — ABNORMAL LOW (ref 13.0–17.0)
MCH: 33 pg (ref 26.0–34.0)
MCHC: 33.5 g/dL (ref 30.0–36.0)
MCV: 98.6 fL (ref 80.0–100.0)
Platelets: 192 10*3/uL (ref 150–400)
RBC: 2.79 MIL/uL — ABNORMAL LOW (ref 4.22–5.81)
RDW: 13.2 % (ref 11.5–15.5)
WBC: 8.2 10*3/uL (ref 4.0–10.5)
nRBC: 0 % (ref 0.0–0.2)

## 2020-12-14 LAB — BASIC METABOLIC PANEL
Anion gap: 5 (ref 5–15)
BUN: 22 mg/dL (ref 8–23)
CO2: 27 mmol/L (ref 22–32)
Calcium: 7.8 mg/dL — ABNORMAL LOW (ref 8.9–10.3)
Chloride: 107 mmol/L (ref 98–111)
Creatinine, Ser: 0.73 mg/dL (ref 0.61–1.24)
GFR, Estimated: >60 mL/min (ref >60)
Glucose, Bld: 129 mg/dL — ABNORMAL HIGH (ref 70–99)
Potassium: 4.6 mmol/L (ref 3.5–5.1)
Sodium: 139 mmol/L (ref 135–145)

## 2020-12-14 LAB — GLUCOSE, CAPILLARY
Glucose-Capillary: 124 mg/dL — ABNORMAL HIGH (ref 70–99)
Glucose-Capillary: 131 mg/dL — ABNORMAL HIGH (ref 70–99)
Glucose-Capillary: 87 mg/dL (ref 70–99)
Glucose-Capillary: 113 mg/dL — ABNORMAL HIGH (ref 70–99)
Glucose-Capillary: 129 mg/dL — ABNORMAL HIGH (ref 70–99)

## 2020-12-14 LAB — PHOSPHORUS: Phosphorus: 3.4 mg/dL (ref 2.5–4.6)

## 2020-12-14 LAB — CULTURE, RESPIRATORY W GRAM STAIN

## 2020-12-14 LAB — MAGNESIUM: Magnesium: 2 mg/dL (ref 1.7–2.4)

## 2020-12-14 MED ORDER — SODIUM CHLORIDE 0.9 % IV SOLN
2.0000 g | INTRAVENOUS | Status: DC
  Administered 2020-12-14 – 2020-12-16 (×3): 2 g via INTRAVENOUS
  Filled 2020-12-14: qty 20
  Filled 2020-12-14 (×3): qty 2

## 2020-12-14 MED ORDER — HEPARIN SODIUM (PORCINE) 5000 UNIT/ML IJ SOLN
5000.0000 [IU] | Freq: Two times a day (BID) | INTRAMUSCULAR | Status: DC
Start: 2020-12-14 — End: 2020-12-17
  Administered 2020-12-14 – 2020-12-16 (×5): 5000 [IU] via SUBCUTANEOUS
  Filled 2020-12-14 (×5): qty 1

## 2020-12-14 MED ORDER — MIDAZOLAM HCL 2 MG/2ML IJ SOLN
2.0000 mg | INTRAMUSCULAR | Status: DC | PRN
  Administered 2020-12-14 – 2020-12-15 (×3): 2 mg via INTRAVENOUS
  Administered 2020-12-16: 4 mg via INTRAVENOUS
  Administered 2020-12-16: 2 mg via INTRAVENOUS
  Administered 2020-12-16 – 2020-12-17 (×2): 4 mg via INTRAVENOUS
  Filled 2020-12-14: qty 2
  Filled 2020-12-14 (×2): qty 4
  Filled 2020-12-14 (×2): qty 2
  Filled 2020-12-14: qty 4
  Filled 2020-12-14: qty 2

## 2020-12-14 MED ORDER — HYDROXYZINE HCL 50 MG PO TABS
50.0000 mg | ORAL_TABLET | Freq: Three times a day (TID) | ORAL | Status: DC | PRN
  Administered 2020-12-14: 50 mg
  Filled 2020-12-14 (×2): qty 1

## 2020-12-14 MED ORDER — MIDODRINE HCL 5 MG PO TABS
10.0000 mg | ORAL_TABLET | Freq: Three times a day (TID) | ORAL | Status: DC
  Administered 2020-12-14 – 2020-12-17 (×9): 10 mg
  Filled 2020-12-14 (×9): qty 2

## 2020-12-14 MED ORDER — CLONAZEPAM 0.5 MG PO TABS
0.5000 mg | ORAL_TABLET | Freq: Two times a day (BID) | ORAL | Status: DC
  Administered 2020-12-14 – 2020-12-16 (×5): 0.5 mg
  Filled 2020-12-14 (×5): qty 1

## 2020-12-14 MED ORDER — METHYLPREDNISOLONE SODIUM SUCC 40 MG IJ SOLR
40.0000 mg | INTRAMUSCULAR | Status: DC
  Administered 2020-12-15: 40 mg via INTRAVENOUS
  Filled 2020-12-14: qty 1

## 2020-12-14 MED ORDER — OXYCODONE HCL 5 MG/5ML PO SOLN
5.0000 mg | Freq: Four times a day (QID) | ORAL | Status: DC
  Administered 2020-12-14 – 2020-12-17 (×11): 5 mg
  Filled 2020-12-14 (×11): qty 5

## 2020-12-14 MED ORDER — PANTOPRAZOLE 2 MG/ML SUSPENSION
40.0000 mg | Freq: Every day | ORAL | Status: DC
Start: 2020-12-14 — End: 2020-12-17
  Administered 2020-12-14 – 2020-12-16 (×3): 40 mg
  Filled 2020-12-14 (×3): qty 20

## 2020-12-14 NOTE — Progress Notes (Signed)
Dr. Merrily Pew to bedside around 1035 and ordered max Fentanyl dose 200 mcg per hour and max Propofol 50 mcg/kg/min. Pt quickly became agitated, thrashing, with all 4 extremities, with O2 desaturation. RT to bedside as well as pt at risk for self extubation. Dr. Merrily Pew back to bedside  and ordered Fent increase to 400. Pt also given IV Versed 2 mg.

## 2020-12-14 NOTE — Progress Notes (Signed)
Spoke with pt's Legal Guardian Kathalene Frames regarding clinical course, along with following up with [facility] once able to extubate.  We discussed that pt's vent settings would now elicit performing SBT's with the hope that he would succeed with extubation, HOWEVER given his failed extubation trial last Friday, we will need guidance on what to do if he fails extubation again.  We discussed 2 options if her were to fail extubation again: -Reintubation which would be committing to [facility] (but given his cognitive dysfunction, there would be high likelihood he would pull it out along with PEG tube if placed)          Vs.  -Transitioning to comfort measures  Toniann Fail voices that most likely she would not feel it's in the pt's best interest to purse [facility], however she would like to discuss with his caregivers/providers at his facility.  She hopes to have a decision made by tomorrow, and asks that we follow up tomorrow morning.   Will keep pt intubated and sedated for the time being.  Discussed with Dr. Merrily Pew.     Harlon Ditty, AGACNP-BC  Pulmonary & Critical Care Prefer epic messenger for cross cover needs If after hours, please call E-link

## 2020-12-14 NOTE — Progress Notes (Signed)
Nutrition Follow-up  DOCUMENTATION CODES:   Not applicable  INTERVENTION:   Continue TF via OGT:   Vital High Protein @ 40 ml/hr (960 ml daily)   30 ml free water flush every 4 hours for tube patency   Tube feeding regimen provides 960 kcal (66% of needs), 84 grams of protein, and 803 ml of H2O.  Total free water: 983 ml daily   TF + propofol provides 1496 kcals (meeting >100% of needs)  NUTRITION DIAGNOSIS:   Inadequate oral intake related to inability to eat as evidenced by NPO status.  Ongoing  GOAL:   Patient will meet greater than or equal to 90% of their needs  Met with TF  MONITOR:   Vent status, Labs, Weight trends, TF tolerance, Skin, I & O's  REASON FOR ASSESSMENT:   Ventilator    ASSESSMENT:   65 yo with cognitive dysfunction with psychiatric and neurological diease with severe resp failure due to aspiration of bloody secretions leading to asphyxia leading to emergent intubation with severe hyponatremia  11/30- intubated 12/2- s/p extubated, re-intubated due to possible aspiration event  Patient is currently intubated on ventilator support MV: 7.3 L/min Temp (24hrs), Avg:97.8 F (36.6 C), Min:95.5 F (35.3 C), Max:98.4 F (36.9 C)  Propofol: 20.3 ml/hr (provides 526 kcals)  Reviewed I/O's: +4.8 L x 24 hours and +1.6 L since admission  UOP: 1.5 L x 24 hours  Rectal tube output: 80 ml x 24 hours  Case discussed with MD, RN, and during ICU rounds.   Per MD, is now a DNR. He continues to received propofol, levophed, fentanyl, and versed. Pt has been agitated and trying to pull on lines, etc.   POA has been located; plan for goals of care discussions. Trach/PEG may be pursued if within goals of care.   Medications reviewed and include miralax, solu-medrol, and levophed.   Labs reviewed: CBGS: 113-134 (inpatient orders for glycemic control are none).    Diet Order:   Diet Order             Diet NPO time specified  Diet effective now                    EDUCATION NEEDS:   Not appropriate for education at this time  Skin:  Skin Assessment: Reviewed RN Assessment  Last BM:  12/14/20 (80 ml via rectal tube)  Height:   Ht Readings from Last 1 Encounters:  12/11/20 5' (1.524 m)    Weight:   Wt Readings from Last 1 Encounters:  12/14/20 68.5 kg    Ideal Body Weight:  48.2 kg  BMI:  Body mass index is 29.49 kg/m.  Estimated Nutritional Needs:   Kcal:  1455  Protein:  80-100 grams  Fluid:  > 1.4 L    Loistine Chance, RD, LDN, [place] Registered Dietitian II Certified Diabetes Care and Education Specialist Please refer to [facility] for RD and/or RD on-call/weekend/after hours pager

## 2020-12-14 NOTE — Progress Notes (Signed)
PHARMACIST - PHYSICIAN COMMUNICATION  CONCERNING: IV to Oral Route Change Policy  RECOMMENDATION: This patient is receiving pantoprazole by the intravenous route.  Based on criteria approved by the Pharmacy and Therapeutics Committee, the intravenous medication(s) is/are being converted to the equivalent oral dose form(s).   DESCRIPTION: These criteria include: The patient is eating (either orally or via tube) and/or has been taking other orally administered medications for a least 24 hours The patient has no evidence of active gastrointestinal bleeding or impaired GI absorption (gastrectomy, short bowel, patient on TNA or NPO).  If you have questions about this conversion, please contact the Pharmacy Department   Tressie Ellis, [facility] 12/14/2020 8:33 AM

## 2020-12-14 NOTE — Plan of Care (Signed)

## 2020-12-14 NOTE — Progress Notes (Signed)
NAME:  Ryan Mayer, MRN:  [id number], DOB:  1955-04-16, LOS: 5 ADMISSION DATE:  11/17/2020, CONSULTATION DATE:  12/09/2020 REFERRING MD:  Dr. Cyndia Skeeters, CHIEF COMPLAINT:  Acute Respiratory Distress   Brief Pt Description / Synopsis:  65 yo with PMH of intellectual disability, agitation, epilepsy admitted with Hypertensive Urgency, severe acute Hyponatremia (? SIADH), agitation, and elevated troponin (demand ischemia vs NSTEMI). Developed Acute Hypoxic Respiratory Failure on 11/30 due to aspiration of bloody secretions leading to asphyxia requiring emergent intubation.  Pertinent  Medical History  Intellectual disability Seizures Hypertension  Micro Data:  11/15/2020: SARS-CoV-2 and influenza PCR>> negative 12/09/2020: MRSA PCR>> negative 12/11/2020: Tracheal aspirate>>SERRATIA MARCESCENS (Resistant to Cefazolin)  Antimicrobials:  Unasyn 12/2>>12/5 Ceftriaxone 12/5>>  Significant Hospital Events: Including procedures, antibiotic start and stop dates in addition to other pertinent events   11/29: Admitted by Hospitalist 11/30 Developed acute hypoxic respiratory failure due to aspiration, emergently intubated,  12/1 remains on vent, severe resp failure 12/2: Extubated.  However required reintubation shortly after due to concern for aspiration and hypoxia.  Unasyn started.  Plan for bronchoscopy and PICC line placement 12/3: remains on vent,+aspiration pneumonia 12/5: Tracheal aspirate growing Serratia Marcescens, ABX changed to Ceftriaxone.  Prior to pursing SBT, needs follow up with POA regarding [place] and whether to pursue Trach if pt's fails extubation again  Interim History / Subjective:  -No significant events noted overnight -Afebrile, hemodynamically stable, requiring 2 mcg Levophed -Tracheal aspirate growing Serratia Marcescens ~ ABX chagned to Ceftriaxone -Pt is on minimal vent settings, Given that pt failed extubation trial previously, will need to have discussion regarding [place]  with POA prior to further SBT's, and whether to proceed with [facility] if he were to fail extubation again (pt likely would not tolerate with high risk of self removal TRACH due to his cognitive dysfunction) -Hyponatremia resolved ~ will trend Na daily   Objective   Blood pressure 125/67, pulse 67, temperature (!) 96.3 F (35.7 C), temperature source Esophageal, resp. rate 16, height 5' (1.524 m), weight 68.5 kg, SpO2 96 %.    Vent Mode: PRVC FiO2 (%):  [24 %-40 %] 35 % Set Rate:  [16 bmp] 16 bmp Vt Set:  [450 mL] 450 mL PEEP:  [5 cmH20] 5 cmH20 Plateau Pressure:  [14 cmH20-17 cmH20] 14 cmH20   Intake/Output Summary (Last 24 hours) at 12/14/2020 0819 Last data filed at 12/14/2020 0700 Gross per 24 hour  Intake 6319.61 ml  Output 1560 ml  Net 4759.61 ml    Filed Weights   12/12/20 0325 12/13/20 0103 12/14/20 0448  Weight: 67.1 kg 68.6 kg 68.5 kg    Examination: General: Acutely ill-appearing male, laying in bed, intubated and sedated, no acute distress HENT: Atraumatic, normocephalic, neck supple, no JVD Lungs: Coarse breath sounds bilaterally, synchronous with the vent, even Cardiovascular: Regular rate and  rhythm, S1-S2, no murmurs, rubs, gallops, 2+ distal pulses Abdomen: Soft, nontender, nondistended, no guarding rebound tenderness, bowel sounds positive all 4 Extremities: Normal bulk and tone, no deformities, no edema Neuro: Patient currently sedated (RASS -2), nursing reports when sedation is lightened patient is extremely agitated and delirious trying to self extubate and get out of bed, pupils unequal (left 5 mm, right 2 mm) GU: Foley catheter in place  Resolved Hospital Problem list     Assessment & Plan:   Acute Hypoxic Respiratory Failure due to Aspiration -Extubated 12/11/2020, however required reintubation 12/2 shortly after -Full vent support, implement lung protective strategies -Plateau pressures less than 30 cm  H20 -Wean FiO2 & PEEP as tolerated to maintain O2  sats >92% -Follow intermittent Chest X-ray & ABG as needed -Spontaneous Breathing Trials when respiratory parameters met and mental status permits -Implement VAP Bundle -Prn Bronchodilators -IV Solumedrol -Transition ABX to Ceftriaxone -Given that pt failed extubation trial previously, will need to have discussion regarding [place] with POA prior to further SBT's, and whether to proceed with [facility] if he were to fail extubation again (pt likely would not tolerate with high risk of self removal TRACH due to his cognitive dysfunction)  Hypotension, ? septic shock vs Sedation related Elevated Troponin, demand ischemia vs NSTEMI Hypertensive Urgency (earlier in hospital stay) -Continuous cardiac monitoring -Maintain MAP >65 -Vasopressors as needed to maintain MAP goal -Start Midodrine -Lactic acid normalized -HS Troponin peaked at 1162 -Echocardiogram 12/10/20: LVEF 7-65%, Grade I diastolic dysfunction, RV systolic function normal -Cardiology following, appreciate input -Continue ASA 81 mg daily -Hold antihypertensives due to requirement for vasopressors  Concern for Aspiration Pneumonia ~ Tracheal Aspirate with Serratia Marcescens -Monitor fever curve -Trend WBC's & Procalcitonin -Follow cultures as above -Change ABX to Ceftriaxone  Hyponatremia, ? SIADH (pt is on Tegretol and multiple antipsychotic medications) ~ RESOLVED -Monitor I&O's / urinary output -Follow BMP -Ensure adequate renal perfusion -Avoid nephrotoxic agents as able -Replace electrolytes as indicated  Acute Metabolic Encephalopathy Intellectual disability/agitation Sedation needs in setting of mechanical ventilation PMHx: Seizure disorder/epilepsy -Maintain a RASS goal of 0 to -1 -Fentanyl and Propofol drips as needed to maintain RASS goal -Avoid sedating medications as able -Daily wake up assessment -CT Head 12/1 negative for acute intracranial abnormality -Continue Tegretol -Prn Benzodiazepines      Best  Practice (right click and "Reselect all SmartList Selections" daily)   Diet/type: tubefeeds and NPO DVT prophylaxis: SCD GI prophylaxis: PPI Lines: Right arm PICC line, and it is still needed Foley:  Yes, and it is still needed Code Status: DNR Last date of multidisciplinary goals of care discussion [12/14/2020]  Labs   CBC: Recent Labs  Lab 11/13/2020 1804 12/10/20 0733 12/11/20 0423 12/12/20 0327 12/13/20 0341 12/14/20 0405  WBC 5.6 8.1 8.3 12.1* 7.6 8.2  NEUTROABS 3.3  --   --   --   --   --   HGB 13.3 9.9* 10.2* 10.7* 9.6* 9.2*  HCT 35.8* 26.8* 28.2* 30.1* 28.1* 27.5*  MCV 87.3 86.2 90.4 95.0 96.2 98.6  PLT 230 198 199 208 181 192     Basic Metabolic Panel: Recent Labs  Lab 12/09/20 0529 12/09/20 1459 12/10/20 0733 12/10/20 1433 12/11/20 0423 12/11/20 1608 12/11/20 2256 12/12/20 0208 12/12/20 1156 12/12/20 2012 12/13/20 0341 12/13/20 1253 12/13/20 1920 12/14/20 0405  NA 122*   < > 127*   < > 132* 131*   < > 132*   < > 134* 135 139 138 139  K  --    < > 2.8*  --  4.2  --   --  4.3  --   --  4.1  --   --  4.6  CL  --    < > 98  --  108  --   --  104  --   --  106  --   --  107  CO2  --    < > 22  --  22  --   --  22  --   --  25  --   --  27  GLUCOSE  --    < > 126*  --  119*  --   --  158*  --   --  152*  --   --  129*  BUN  --    < > 13  --  15  --   --  15  --   --  16  --   --  22  CREATININE  --    < > 0.75  --  0.88  --   --  0.81  --   --  0.77  --   --  0.73  CALCIUM  --    < > 7.6*  --  7.7*  --   --  7.7*  --   --  7.5*  --   --  7.8*  MG 1.4*  --  2.2  --   --   --   --  2.1  --   --  2.1  --   --  2.0  PHOS  --   --  3.2  3.1   < > 2.5 2.4*  --  3.5  --   --  3.0  --   --  3.4   < > = values in this interval not displayed.    GFR: Estimated Creatinine Clearance: 76.7 mL/min (by C-G formula based on SCr of 0.73 mg/dL). Recent Labs  Lab 12/11/20 0423 12/12/20 0327 12/13/20 0341 12/14/20 0405  WBC 8.3 12.1* 7.6 8.2     Liver Function  Tests: Recent Labs  Lab 11/23/2020 1804 12/10/20 0733 12/11/20 0441  AST 16  --  43*  ALT 15  --  22  ALKPHOS 84  --  57  BILITOT 0.7  --  0.3  PROT 7.4  --  5.5*  ALBUMIN 4.0 2.9* 2.7*    No results for input(s): LIPASE, AMYLASE in the last 168 hours. No results for input(s): AMMONIA in the last 168 hours.  ABG    Component Value Date/Time   PHART 7.36 12/09/2020 1835   PCO2ART 42 12/09/2020 1835   PO2ART 136 (H) 12/09/2020 1835   HCO3 23.7 12/09/2020 1835   ACIDBASEDEF 1.7 12/09/2020 1835   O2SAT 99.0 12/09/2020 1835      Coagulation Profile: No results for input(s): INR, PROTIME in the last 168 hours.  Cardiac Enzymes: Recent Labs  Lab 12/09/20 1516 12/10/20 0733  CKTOTAL 462* 2,632*     HbA1C: No results found for: HGBA1C  CBG: Recent Labs  Lab 12/13/20 1923 12/13/20 2001 12/13/20 2351 12/14/20 0327 12/14/20 0746  GLUCAP 123* 134* 113* 124* 131*     Review of Systems:   Unable to assess due to Intubation & sedation   Past Medical History:  He,  has a past medical history of Hypertension, Intellectual disability, and Seizure ([place]).   Surgical History:  History reviewed. No pertinent surgical history.   Social History:   reports that he has never smoked. He has never used smokeless tobacco. He reports that he does not drink alcohol and does not use drugs.   Family History:  His family history is not on file.   Allergies No Known Allergies   Home Medications  Prior to Admission medications   Medication Sig Start Date End Date Taking? Authorizing Provider  acetaZOLAMIDE ER (DIAMOX) 500 MG capsule Take 500 mg by mouth every other day. 12/07/20  Yes [provider]  amLODipine (NORVASC) 5 MG tablet Take 5 mg by mouth daily. 09/10/19  Yes [provider]  aspirin EC 81 MG tablet Take 81  mg by mouth daily. Swallow whole.   Yes [provider]  atropine 1 % ophthalmic solution Place 1 drop into the left eye 6 (six)  times daily.   Yes [provider]  brimonidine (ALPHAGAN) 0.2 % ophthalmic solution Place 1 drop into the left eye 3 (three) times daily.   Yes [provider]  carbamazepine (TEGRETOL) 200 MG tablet Take 200 mg by mouth 3 (three) times daily. 09/10/19  Yes [provider]  carvedilol (COREG) 25 MG tablet Take 25 mg by mouth 2 (two) times daily with a meal.   Yes [provider]  cetirizine (ZYRTEC) 10 MG tablet Take 10 mg by mouth 2 (two) times daily.   Yes [provider]  cloNIDine (CATAPRES) 0.1 MG tablet Take 0.1 mg by mouth 3 (three) times daily. 0700, NOON, 1800   Yes [provider]  docusate sodium (COLACE) 100 MG capsule Take 100 mg by mouth 3 (three) times daily as needed for mild constipation or moderate constipation.   Yes [provider]  dorzolamide-timolol (COSOPT) 22.3-6.8 MG/ML ophthalmic solution Place 1 drop into the left eye 2 (two) times daily.   Yes [provider]  Emollient (CETAPHIL) cream Apply 1 application topically as directed. Apply after bath   Yes [provider]  escitalopram (LEXAPRO) 10 MG tablet Take 10 mg by mouth daily. 09/10/19  Yes [provider]  fesoterodine (TOVIAZ) 8 MG TB24 tablet Take 8 mg by mouth daily.   Yes [provider]  fluticasone (FLONASE) 50 MCG/ACT nasal spray Place 1 spray into both nostrils 2 (two) times daily. 09/19/19  Yes [provider]  lisinopril (ZESTRIL) 20 MG tablet Take 20 mg by mouth daily. 11/07/20  Yes [provider]  lisinopril-hydrochlorothiazide (ZESTORETIC) 20-12.5 MG tablet Take 1 tablet by mouth daily. 12/07/20  Yes [provider]  LORazepam (ATIVAN) 1 MG tablet Take 1 mg by mouth in the morning and at bedtime. (1700 & 2000)   Yes [provider]  pantoprazole (PROTONIX) 40 MG tablet Take 40 mg by mouth daily. 09/10/19  Yes [provider]  prednisoLONE acetate (PRED FORTE) 1 %  ophthalmic suspension Place 1 drop into the left eye 6 (six) times daily.   Yes [provider]  risperiDONE (RISPERDAL) 0.5 MG tablet Take 1 mg by mouth See admin instructions. Take 2 tablets (1 mg) by mouth every morning and 2 tablets (1 mg) at bedtime   Yes [provider]  acetaminophen (TYLENOL) 325 MG tablet Take 650 mg by mouth every 4 (four) hours as needed for fever.    [provider]  bisacodyl (DULCOLAX) 5 MG EC tablet Take 10 mg by mouth every 12 (twelve) hours as needed for severe constipation.    [provider]  camphor-menthol Timoteo Ace) lotion Apply 1 application topically as needed for itching.    [provider]  carbamide peroxide (DEBROX) 6.5 % OTIC solution Place 5-10 drops into both ears every Friday.    [provider]  chlorhexidine (PERIDEX) 0.12 % solution Use as directed 5 mLs in the mouth or throat 2 (two) times daily.    [provider]  diphenhydrAMINE (BENADRYL) 25 mg capsule Take 25 mg by mouth every 6 (six) hours as needed.    [provider]  guaiFENesin (ROBITUSSIN) 100 MG/5ML SOLN Take 5 mLs by mouth every 4 (four) hours as needed for cough.    [provider]  hydrOXYzine (ATARAX/VISTARIL) 50 MG tablet Take 50  mg by mouth 3 (three) times daily as needed for itching or anxiety.    [provider]  latanoprost (XALATAN) 0.005 % ophthalmic solution Place 1 drop into the left eye at bedtime.    [provider]  levocetirizine (XYZAL) 5 MG tablet Take 5 mg by mouth at bedtime.  09/10/19   [provider]  loperamide (IMODIUM) 2 MG capsule Take 2-4 mg by mouth See admin instructions. Take 2 capsules (61m) by mouth after first loose stool then take 1 capsule (250m by mouth after each additional loose stool (PRN)    [provider]  LORazepam (ATIVAN) 1 MG tablet Take 1 mg by mouth daily as needed for anxiety or sedation.    [provider]   metroNIDAZOLE (METROCREAM) 0.75 % cream Apply 1 application topically at bedtime. (Apply to face)    [provider]  naltrexone (DEPADE) 50 MG tablet Take 50 mg by mouth daily. Patient not taking: Reported on 12/09/2020    [provider]  neomycin-bacitracin-polymyxin (NEOSPORIN) ointment Apply 1 application topically daily as needed for wound care.    [provider]  OLANZapine (ZYPREXA) 10 MG tablet Take 10 mg by mouth at bedtime. 11/30/2020   [provider]  polyethylene glycol (MIRALAX / GLYCOLAX) 17 g packet Take 17 g by mouth daily.    [provider]  potassium chloride SA (KLOR-CON) 20 MEQ tablet Take 1 tablet (20 mEq total) by mouth daily. Patient not taking: Reported on 12/09/2020 10/02/20   DaEdwin DadaMD  tolnaftate (TINACTIN) 1 % cream Apply 1 application topically 2 (two) times daily as needed (itchy feet).    [provider]  traZODone (DESYREL) 50 MG tablet Take 50-125 mg by mouth 3 (three) times daily. Take one 50 mg tablet by mouth every morning, one 50 mg tablet at 2 PM, and two and one-half tablets 125 mg at bedtime    [provider]     Critical care time: 40 minutes     JeDarel HongAGACNP-BC [facility] Cr[facility]arpendalep[facility] messenger for cross cover needs If after hours, please call E-link

## 2020-12-15 ENCOUNTER — Inpatient Hospital Stay: Payer: Medicare Other

## 2020-12-15 ENCOUNTER — Encounter: Payer: Self-pay | Admitting: Internal Medicine

## 2020-12-15 DIAGNOSIS — Z515 Encounter for palliative care: Secondary | ICD-10-CM

## 2020-12-15 DIAGNOSIS — Z7189 Other specified counseling: Secondary | ICD-10-CM | POA: Diagnosis not present

## 2020-12-15 DIAGNOSIS — E876 Hypokalemia: Secondary | ICD-10-CM | POA: Diagnosis not present

## 2020-12-15 DIAGNOSIS — J9601 Acute respiratory failure with hypoxia: Secondary | ICD-10-CM | POA: Diagnosis not present

## 2020-12-15 DIAGNOSIS — F79 Unspecified intellectual disabilities: Secondary | ICD-10-CM | POA: Diagnosis not present

## 2020-12-15 DIAGNOSIS — G40909 Epilepsy, unspecified, not intractable, without status epilepticus: Secondary | ICD-10-CM | POA: Diagnosis not present

## 2020-12-15 DIAGNOSIS — E871 Hypo-osmolality and hyponatremia: Secondary | ICD-10-CM | POA: Diagnosis not present

## 2020-12-15 LAB — GLUCOSE, CAPILLARY
Glucose-Capillary: 105 mg/dL — ABNORMAL HIGH (ref 70–99)
Glucose-Capillary: 106 mg/dL — ABNORMAL HIGH (ref 70–99)
Glucose-Capillary: 108 mg/dL — ABNORMAL HIGH (ref 70–99)
Glucose-Capillary: 108 mg/dL — ABNORMAL HIGH (ref 70–99)
Glucose-Capillary: 117 mg/dL — ABNORMAL HIGH (ref 70–99)
Glucose-Capillary: 119 mg/dL — ABNORMAL HIGH (ref 70–99)
Glucose-Capillary: 123 mg/dL — ABNORMAL HIGH (ref 70–99)

## 2020-12-15 LAB — BASIC METABOLIC PANEL
Anion gap: 6 (ref 5–15)
BUN: 22 mg/dL (ref 8–23)
CO2: 24 mmol/L (ref 22–32)
Calcium: 6.8 mg/dL — ABNORMAL LOW (ref 8.9–10.3)
Chloride: 100 mmol/L (ref 98–111)
Creatinine, Ser: 0.58 mg/dL — ABNORMAL LOW (ref 0.61–1.24)
GFR, Estimated: >60 mL/min (ref >60)
Glucose, Bld: 113 mg/dL — ABNORMAL HIGH (ref 70–99)
Potassium: 4.2 mmol/L (ref 3.5–5.1)
Sodium: 130 mmol/L — ABNORMAL LOW (ref 135–145)

## 2020-12-15 LAB — CBC
HCT: 22.7 % — ABNORMAL LOW (ref 39.0–52.0)
Hemoglobin: 8.4 g/dL — ABNORMAL LOW (ref 13.0–17.0)
MCH: 37.3 pg — ABNORMAL HIGH (ref 26.0–34.0)
MCHC: 37 g/dL — ABNORMAL HIGH (ref 30.0–36.0)
MCV: 100.9 fL — ABNORMAL HIGH (ref 80.0–100.0)
Platelets: 155 10*3/uL (ref 150–400)
RBC: 2.25 MIL/uL — ABNORMAL LOW (ref 4.22–5.81)
RDW: 13 % (ref 11.5–15.5)
WBC: 5.7 10*3/uL (ref 4.0–10.5)
nRBC: 0 % (ref 0.0–0.2)

## 2020-12-15 MED ORDER — FUROSEMIDE 10 MG/ML IJ SOLN
40.0000 mg | Freq: Once | INTRAMUSCULAR | Status: AC
  Administered 2020-12-15: 40 mg via INTRAVENOUS
  Filled 2020-12-15: qty 4

## 2020-12-15 NOTE — Care Plan (Signed)
GOALS OF CARE DISCUSSION   The Clinical status was relayed to Legal Guardian, Kathalene Frames at bedside in detail.   Updated and notified of patients medical condition.     Patient remains unresponsive and will not open eyes to command.      Patient with severe respiratory failure with a very high probablity of a very minimal chance of meaningful recovery despite all aggressive and optimal medical therapy.  PATIENT REMAINS DNR  Legal guardian suggested that he try to keep him comfortable and proceed with palliative extubation once all stop to took care of him and see and say goodbye.  In between we will continue with full aggressive treatment with     Family are satisfied with Plan of action and management. All questions answered    Cheri Fowler MD [facility] See Amion for pager If no response to pager, please call [phone] until 7pm After 7pm, Please call E-link [phone]

## 2020-12-15 NOTE — Progress Notes (Signed)
NAME:  Ryan Mayer, MRN:  [id number], DOB:  [birth year], LOS: 6 ADMISSION DATE:  11/11/2020, CONSULTATION DATE:  12/09/2020 REFERRING MD:  Dr. Cyndia Skeeters, CHIEF COMPLAINT:  Acute Respiratory Distress   Brief Pt Description / Synopsis:  65 yo with PMH of intellectual disability, agitation, epilepsy admitted with Hypertensive Urgency, severe acute Hyponatremia (? SIADH), agitation, and elevated troponin (demand ischemia vs NSTEMI). Developed Acute Hypoxic Respiratory Failure on 11/30 due to aspiration of bloody secretions leading to asphyxia requiring emergent intubation.  Pertinent  Medical History  Intellectual disability Seizures Hypertension  Micro Data:  11/21/2020: SARS-CoV-2 and influenza PCR>> negative 12/09/2020: MRSA PCR>> negative 12/11/2020: Tracheal aspirate>>SERRATIA MARCESCENS (Resistant to Cefazolin)  Antimicrobials:  Unasyn 12/2>>12/5 Ceftriaxone 12/5>>  Significant Hospital Events: Including procedures, antibiotic start and stop dates in addition to other pertinent events   11/29: Admitted by Hospitalist 11/30 Developed acute hypoxic respiratory failure due to aspiration, emergently intubated,  12/1 remains on vent, severe resp failure 12/2: Extubated.  However required reintubation shortly after due to concern for aspiration and hypoxia.  Unasyn started.  Plan for bronchoscopy and PICC line placement 12/3: remains on vent,+aspiration pneumonia 12/5: Tracheal aspirate growing Serratia Marcescens, ABX changed to Ceftriaxone.  Prior to pursing SBT, needs follow up with POA regarding [place] and whether to pursue Trach if pt's fails extubation again 12/6: Increase in vent support from yesterday (50% FiO2, 12 PEEP), CXR with bilateral atelectasis and low lung volumes. Na decreased to 130, plan for Lasix x1 dose.  Met with Otho Perl at bedside, plan for compassionate extubation once all staff members have had a chance to visit  Interim History / Subjective:  -Yesterday evening pt  with increased vent support (Peep 12, 50% fiO2) -CXR with low lung volumes and bilateral atelectasis -Afebrile, hemodynamically stable, BP soft but weaned off Levophed -Tracheal aspirate growing Serratia Marcescens ~ ABX chagned to Ceftriaxone -Na decreased to 130, discussed with Dr. Tacy Learn ~ will give 40 mg IV Lasix x1 dose -Goals of care discussion with pt's legal guardian Merry Lofty at bedside (Dr. Tacy Learn, myself, and Palliative Care present) ~ felt that Lurline Idol would not be in pt's best interest and they do not want him to suffer any longer ~ PLAN FOR COMPASSIONATE EXTUBATION ONCE ALL STAFF MEMBERS FROM HIS FACILITY HAVE HAD A CHANCE TO VISIT   Objective   Blood pressure 96/60, pulse (!) 58, temperature 98.8 F (37.1 C), temperature source Axillary, resp. rate 16, height 5' (1.524 m), weight 70.5 kg, SpO2 95 %.    Vent Mode: PRVC FiO2 (%):  [35 %-55 %] 50 % Set Rate:  [16 bmp] 16 bmp Vt Set:  [450 mL] 450 mL PEEP:  [5 cmH20-12 cmH20] 12 cmH20 Plateau Pressure:  [21 cmH20] 21 cmH20   Intake/Output Summary (Last 24 hours) at 12/15/2020 0856 Last data filed at 12/15/2020 0600 Gross per 24 hour  Intake 3210.15 ml  Output 1455 ml  Net 1755.15 ml    Filed Weights   12/13/20 0103 12/14/20 0448 12/15/20 0444  Weight: 68.6 kg 68.5 kg 70.5 kg    Examination: General: Acutely ill-appearing male, laying in bed, intubated and sedated, no acute distress HENT: Atraumatic, normocephalic, neck supple, no JVD Lungs: Coarse breath sounds bilaterally, synchronous with the vent, even Cardiovascular: Bradycardia, Regular rhythm, S1-S2, no murmurs, rubs, gallops, 2+ distal pulses Abdomen: Soft, nontender, nondistended, no guarding rebound tenderness, bowel sounds positive all 4 Extremities: Normal bulk and tone, no deformities, no edema Neuro: Patient currently sedated (RASS -2), when  sedation is lightened patient is extremely agitated and delirious trying to self extubate and get out of bed, pupils  unequal (left 5 mm, right 2 mm) GU: Foley catheter in place  Resolved Hospital Problem list     Assessment & Plan:   Acute Hypoxic Respiratory Failure due to Aspiration -Extubated 12/11/2020, however required reintubation 12/2 shortly after -Full vent support, implement lung protective strategies -Plateau pressures less than 30 cm H20 -Wean FiO2 & PEEP as tolerated to maintain O2 sats >92% -Follow intermittent Chest X-ray & ABG as needed -Spontaneous Breathing Trials when respiratory parameters met and mental status permits -Implement VAP Bundle -Prn Bronchodilators -Continue Ceftriaxone -Stop Solumedrol -Goals of care discussion with pt's legal guardian Merry Lofty at bedside (Dr. Tacy Learn, myself, and Palliative Care present) ~ felt that Lurline Idol would not be in pt's best interest and they do not want him to suffer any longer ~ PLAN FOR COMPASSIONATE EXTUBATION ONCE ALL STAFF MEMBERS FROM HIS FACILITY HAVE HAD A CHANCE TO VISIT  Hypotension, ? septic shock vs Sedation related Elevated Troponin, demand ischemia vs NSTEMI Hypertensive Urgency (earlier in hospital stay) -Continuous cardiac monitoring -Maintain MAP >65 -Vasopressors as needed to maintain MAP goal ~ currently weaned off -Continue Midodrine -Lactic acid normalized -HS Troponin peaked at 1162 -Echocardiogram 12/10/20: LVEF 6-22%, Grade I diastolic dysfunction, RV systolic function normal -Cardiology following, appreciate input -Continue ASA 81 mg daily -Hold antihypertensives due to requirement for vasopressors  Concern for Aspiration Pneumonia ~ Tracheal Aspirate with Serratia Marcescens -Monitor fever curve -Trend WBC's & Procalcitonin -Follow cultures as above -Continue Ceftriaxone  Hyponatremia, ? SIADH (pt is on Tegretol and multiple antipsychotic medications)  -Monitor I&O's / urinary output -Follow BMP -Ensure adequate renal perfusion -Avoid nephrotoxic agents as able -Replace electrolytes as indicated -Plan  for IV Lasix 40 mg x1 dose 29/7  Acute Metabolic Encephalopathy Intellectual disability/agitation Sedation needs in setting of mechanical ventilation PMHx: Seizure disorder/epilepsy -Maintain a RASS goal of 0 to -1 -Fentanyl and Propofol drips as needed to maintain RASS goal -Avoid sedating medications as able -Daily wake up assessment -CT Head 12/1 negative for acute intracranial abnormality -Continue Tegretol -Prn Benzodiazepines -Continue scheduled Clonazepam and Oxycodone to assist with decreasing IV infusion requirements      Best Practice (right click and "Reselect all SmartList Selections" daily)   Diet/type: tubefeeds and NPO DVT prophylaxis: Heparin SQ GI prophylaxis: PPI Lines: Right arm PICC line, and it is still needed Foley:  Yes, and it is still needed Code Status: DNR/DNI Last date of multidisciplinary goals of care discussion [12/15/2020]  Labs   CBC: Recent Labs  Lab 11/11/2020 1804 12/10/20 0733 12/11/20 0423 12/12/20 0327 12/13/20 0341 12/14/20 0405 12/15/20 0441  WBC 5.6   < > 8.3 12.1* 7.6 8.2 5.7  NEUTROABS 3.3  --   --   --   --   --   --   HGB 13.3   < > 10.2* 10.7* 9.6* 9.2* 8.4*  HCT 35.8*   < > 28.2* 30.1* 28.1* 27.5* 22.7*  MCV 87.3   < > 90.4 95.0 96.2 98.6 100.9*  PLT 230   < > 199 208 181 192 155   < > = values in this interval not displayed.     Basic Metabolic Panel: Recent Labs  Lab 12/09/20 0529 12/09/20 1459 12/10/20 0733 12/10/20 1433 12/11/20 0423 12/11/20 1608 12/11/20 2256 12/12/20 0208 12/12/20 1156 12/13/20 0341 12/13/20 1253 12/13/20 1920 12/14/20 0405 12/15/20 0441  NA 122*   < >  127*   < > 132* 131*   < > 132*   < > 135 139 138 139 130*  K  --    < > 2.8*  --  4.2  --   --  4.3  --  4.1  --   --  4.6 4.2  CL  --    < > 98  --  108  --   --  104  --  106  --   --  107 100  CO2  --    < > 22  --  22  --   --  22  --  25  --   --  27 24  GLUCOSE  --    < > 126*  --  119*  --   --  158*  --  152*  --   --   129* 113*  BUN  --    < > 13  --  15  --   --  15  --  16  --   --  22 22  CREATININE  --    < > 0.75  --  0.88  --   --  0.81  --  0.77  --   --  0.73 0.58*  CALCIUM  --    < > 7.6*  --  7.7*  --   --  7.7*  --  7.5*  --   --  7.8* 6.8*  MG 1.4*  --  2.2  --   --   --   --  2.1  --  2.1  --   --  2.0  --   PHOS  --   --  3.2  3.1   < > 2.5 2.4*  --  3.5  --  3.0  --   --  3.4  --    < > = values in this interval not displayed.    GFR: Estimated Creatinine Clearance: 77.8 mL/min (A) (by C-G formula based on SCr of 0.58 mg/dL (L)). Recent Labs  Lab 12/12/20 0327 12/13/20 0341 12/14/20 0405 12/15/20 0441  WBC 12.1* 7.6 8.2 5.7     Liver Function Tests: Recent Labs  Lab 11/28/2020 1804 12/10/20 0733 12/11/20 0441  AST 16  --  43*  ALT 15  --  22  ALKPHOS 84  --  57  BILITOT 0.7  --  0.3  PROT 7.4  --  5.5*  ALBUMIN 4.0 2.9* 2.7*    No results for input(s): LIPASE, AMYLASE in the last 168 hours. No results for input(s): AMMONIA in the last 168 hours.  ABG    Component Value Date/Time   PHART 7.36 12/09/2020 1835   PCO2ART 42 12/09/2020 1835   PO2ART 136 (H) 12/09/2020 1835   HCO3 23.7 12/09/2020 1835   ACIDBASEDEF 1.7 12/09/2020 1835   O2SAT 99.0 12/09/2020 1835      Coagulation Profile: No results for input(s): INR, PROTIME in the last 168 hours.  Cardiac Enzymes: Recent Labs  Lab 12/09/20 1516 12/10/20 0733  CKTOTAL 462* 2,632*     HbA1C: No results found for: HGBA1C  CBG: Recent Labs  Lab 12/14/20 1511 12/14/20 1940 12/14/20 2359 12/15/20 0422 12/15/20 0718  GLUCAP 113* 129* 108* 123* 119*     Review of Systems:   Unable to assess due to Intubation & sedation   Past Medical History:  He,  has a past medical history of Hypertension, Intellectual disability, and Seizure ([place]).  Surgical History:  History reviewed. No pertinent surgical history.   Social History:   reports that he has never smoked. He has never used smokeless  tobacco. He reports that he does not drink alcohol and does not use drugs.   Family History:  His family history is not on file.   Allergies No Known Allergies   Home Medications  Prior to Admission medications   Medication Sig Start Date End Date Taking? Authorizing Provider  acetaZOLAMIDE ER (DIAMOX) 500 MG capsule Take 500 mg by mouth every other day. 12/07/20  Yes [provider]  amLODipine (NORVASC) 5 MG tablet Take 5 mg by mouth daily. 09/10/19  Yes [provider]  aspirin EC 81 MG tablet Take 81 mg by mouth daily. Swallow whole.   Yes [provider]  atropine 1 % ophthalmic solution Place 1 drop into the left eye 6 (six) times daily.   Yes [provider]  brimonidine (ALPHAGAN) 0.2 % ophthalmic solution Place 1 drop into the left eye 3 (three) times daily.   Yes [provider]  carbamazepine (TEGRETOL) 200 MG tablet Take 200 mg by mouth 3 (three) times daily. 09/10/19  Yes [provider]  carvedilol (COREG) 25 MG tablet Take 25 mg by mouth 2 (two) times daily with a meal.   Yes [provider]  cetirizine (ZYRTEC) 10 MG tablet Take 10 mg by mouth 2 (two) times daily.   Yes [provider]  cloNIDine (CATAPRES) 0.1 MG tablet Take 0.1 mg by mouth 3 (three) times daily. 0700, NOON, 1800   Yes [provider]  docusate sodium (COLACE) 100 MG capsule Take 100 mg by mouth 3 (three) times daily as needed for mild constipation or moderate constipation.   Yes [provider]  dorzolamide-timolol (COSOPT) 22.3-6.8 MG/ML ophthalmic solution Place 1 drop into the left eye 2 (two) times daily.   Yes [provider]  Emollient (CETAPHIL) cream Apply 1 application topically as directed. Apply after bath   Yes [provider]  escitalopram (LEXAPRO) 10 MG tablet Take 10 mg by mouth daily. 09/10/19  Yes [provider]  fesoterodine (TOVIAZ) 8 MG TB24 tablet Take 8 mg by mouth daily.    Yes [provider]  fluticasone (FLONASE) 50 MCG/ACT nasal spray Place 1 spray into both nostrils 2 (two) times daily. 09/19/19  Yes [provider]  lisinopril (ZESTRIL) 20 MG tablet Take 20 mg by mouth daily. 11/07/20  Yes [provider]  lisinopril-hydrochlorothiazide (ZESTORETIC) 20-12.5 MG tablet Take 1 tablet by mouth daily. 12/07/20  Yes [provider]  LORazepam (ATIVAN) 1 MG tablet Take 1 mg by mouth in the morning and at bedtime. (1700 & 2000)   Yes [provider]  pantoprazole (PROTONIX) 40 MG tablet Take 40 mg by mouth daily. 09/10/19  Yes [provider]  prednisoLONE acetate (PRED FORTE) 1 % ophthalmic suspension Place 1 drop into the left eye 6 (six) times daily.   Yes [provider]  risperiDONE (RISPERDAL) 0.5 MG tablet Take 1 mg by mouth See admin instructions. Take 2 tablets (1 mg) by mouth every morning and 2 tablets (1 mg) at bedtime   Yes [provider]  acetaminophen (TYLENOL) 325 MG tablet Take 650 mg by mouth every 4 (four) hours as needed for fever.    [provider]  bisacodyl (DULCOLAX) 5 MG EC tablet Take 10 mg by mouth every 12 (twelve) hours as needed for severe constipation.  [provider]  camphor-menthol Timoteo Ace) lotion Apply 1 application topically as needed for itching.    [provider]  carbamide peroxide (DEBROX) 6.5 % OTIC solution Place 5-10 drops into both ears every Friday.    [provider]  chlorhexidine (PERIDEX) 0.12 % solution Use as directed 5 mLs in the mouth or throat 2 (two) times daily.    [provider]  diphenhydrAMINE (BENADRYL) 25 mg capsule Take 25 mg by mouth every 6 (six) hours as needed.    [provider]  guaiFENesin (ROBITUSSIN) 100 MG/5ML SOLN Take 5 mLs by mouth every 4 (four) hours as needed for cough.    [provider]  hydrOXYzine (ATARAX/VISTARIL) 50 MG tablet Take 50 mg by mouth 3  (three) times daily as needed for itching or anxiety.    [provider]  latanoprost (XALATAN) 0.005 % ophthalmic solution Place 1 drop into the left eye at bedtime.    [provider]  levocetirizine (XYZAL) 5 MG tablet Take 5 mg by mouth at bedtime.  09/10/19   [provider]  loperamide (IMODIUM) 2 MG capsule Take 2-4 mg by mouth See admin instructions. Take 2 capsules (36m) by mouth after first loose stool then take 1 capsule (212m by mouth after each additional loose stool (PRN)    [provider]  LORazepam (ATIVAN) 1 MG tablet Take 1 mg by mouth daily as needed for anxiety or sedation.    [provider]  metroNIDAZOLE (METROCREAM) 0.75 % cream Apply 1 application topically at bedtime. (Apply to face)    [provider]  naltrexone (DEPADE) 50 MG tablet Take 50 mg by mouth daily. Patient not taking: Reported on 12/09/2020    [provider]  neomycin-bacitracin-polymyxin (NEOSPORIN) ointment Apply 1 application topically daily as needed for wound care.    [provider]  OLANZapine (ZYPREXA) 10 MG tablet Take 10 mg by mouth at bedtime. 11/29/2020   [provider]  polyethylene glycol (MIRALAX / GLYCOLAX) 17 g packet Take 17 g by mouth daily.    [provider]  potassium chloride SA (KLOR-CON) 20 MEQ tablet Take 1 tablet (20 mEq total) by mouth daily. Patient not taking: Reported on 12/09/2020 10/02/20   DaEdwin DadaMD  tolnaftate (TINACTIN) 1 % cream Apply 1 application topically 2 (two) times daily as needed (itchy feet).    [provider]  traZODone (DESYREL) 50 MG tablet Take 50-125 mg by mouth 3 (three) times daily. Take one 50 mg tablet by mouth every morning, one 50 mg tablet at 2 PM, and two and one-half tablets 125 mg at bedtime    [provider]     Critical care time: 40 minutes     JeDarel HongAGACNP-BC [facility]  messenger for cross cover needs If after hours, please call E-link

## 2020-12-15 NOTE — Consult Note (Signed)
Consultation Note Date: 12/15/2020   Patient Name: Ryan Mayer  DOB: May 20, 1955  MRN: [id number]  Age / Sex: 65 y.o., male  PCP: Kirk Ruths, MD Referring Physician: Jacky Kindle, MD  Reason for Consultation: Establishing goals of care  HPI/Patient Profile: 65 y.o. male  with past medical history of severe intellectual disability living in a group home, difficult to control behaviors including agitation, epilepsy with generalized tonic clonic seizures, HTN, chronic constipation, chronic ingestion of nonedible products, hospitalized in September 2022 with hypotension and adjustment of multiple antihypertensives admitted on 11/12/2020 with uncontrolled blood pressures at his group home brought him to the ED.  He was found to have acute hyponatremia of unknown cause which did not improve after 6 hours, continued agitation and ultimately respiratory failure.   Clinical Assessment and Goals of Care: I have reviewed medical records including EPIC notes, labs and imaging, received report from RN, assessed the patient.  Ryan Mayer is lying quietly in bed.  He is intubated/ventilated and sedated.  He appears acutely/chronically ill and quite frail with some temporal wasting.  His legal guardian of 20+ years, Ryan Mayer, is at bedside along with another group home worker Ryan Mayer.  We meet to discuss diagnosis prognosis, GOC, EOL wishes, disposition and options.  Present today is CCM attending Dr. Tacy Learn and nurse practitioner Darel Hong.  I introduced Palliative Medicine as specialized medical care for people living with serious illness. It focuses on providing relief from the symptoms and stress of a serious illness. The goal is to improve quality of life for both the patient and the family.  We discussed a brief life review of the patient Ryan Mayer has been an group care homes for many decades.  Ryan Mayer shares  that she became his legal guardian over 20 years ago.  We then focused on their current illness.  CCM shares details of respiratory failure and acute illness.  They share the worry that Ryan Mayer will not have a meaningful recovery, be able to successfully extubate.  We talked about his medical treatment and care so far.  The natural disease trajectory and expectations at EOL were discussed.  The difference between aggressive medical intervention and comfort care was considered in light of the patient's goals of care.  Ryan Mayer shares that the team has discussed how to best care for Ryan Mayer and has elected for compassionate extubation after team members and his group home have had an opportunity to visit.  She shares that this should be in the next 1 to 2 days.  We talk about compassionate extubation, what this looks like and feels like.  We talked about symptom management, unburden Ryan Mayer from life support to focus on comfort and dignity.  Family states they would like for [place] to keep him comfortable.  Hospice services were explained and offered.  I share that if Ryan Mayer survives for more than a few hours, residential hospice facility would be an option for around-the-clock specialized care.  Discussed the importance of continued conversation  with family and the medical providers regarding overall plan of care and treatment options, ensuring decisions are within the context of the patient's values and GOCs. Questions and concerns were addressed.   The family was encouraged to call with questions or concerns.  PMT will continue to support holistically.  Conference with attending, bedside nursing staff, transition of care team related to patient condition, needs, goals of care, disposition.   [place], legal guardian 20+ years.    SUMMARY OF RECOMMENDATIONS   At this point continue to treat the treatable but no CPR/defibrillation Allowing time for workers from his group  home to visit Anticipate compassionate extubation 12/7 or 12/8   Code Status/Advance Care Planning: DNR  Symptom Management:  Per hospitalist/CCM, no additional needs at this time.  Palliative Prophylaxis:  Oral Care and Turn Reposition  Additional Recommendations (Limitations, Scope, Preferences): Continue to stay the course until family has had time to visit and compassionate extubation  Psycho-social/Spiritual:  Desire for further Chaplaincy support:no Additional Recommendations: Caregiving  Support/Resources and Education on Hospice  Prognosis:  Unable to determine, based on outcomes.  Prognosis discussed with permission with legal guardian.  If family elects comfort measures only, hours to days anticipated.  Discharge Planning:  To be determined, based on outcomes.  Compassionate extubation may lead to in-hospital death versus residential hospice.       Primary Diagnoses: Present on Admission:  Acute hyponatremia   I have reviewed the medical record, interviewed the patient and family, and examined the patient. The following aspects are pertinent.  Past Medical History:  Diagnosis Date   Hypertension    Intellectual disability    Seizure [facility])    Social History   Socioeconomic History   Marital status: Single    Spouse name: Not on file   Number of children: Not on file   Years of education: Not on file   Highest education level: Not on file  Occupational History   Not on file  Tobacco Use   Smoking status: Never   Smokeless tobacco: Never  Substance and Sexual Activity   Alcohol use: Never   Drug use: Never   Sexual activity: Not on file  Other Topics Concern   Not on file  Social History Narrative   Not on file   Social Determinants of Health   Financial Resource Strain: Not on file  Food Insecurity: Not on file  Transportation Needs: Not on file  Physical Activity: Not on file  Stress: Not on file  Social Connections: Not on file    History reviewed. No pertinent family history. Scheduled Meds:  aspirin  81 mg Per Tube Daily   atropine  1 drop Left Eye 6 X Daily   brimonidine  1 drop Left Eye TID   carbamazepine  200 mg Per Tube TID   chlorhexidine gluconate (MEDLINE KIT)  15 mL Mouth Rinse BID   Chlorhexidine Gluconate Cloth  6 each Topical Q0600   clonazePAM  0.5 mg Per Tube BID   docusate  100 mg Per Tube BID   dorzolamide-timolol  1 drop Left Eye BID   feeding supplement (VITAL HIGH PROTEIN)  1,000 mL Per Tube Q24H   fesoterodine  8 mg Oral Daily   free water  30 mL Per Tube Q4H   furosemide  40 mg Intravenous Once   heparin injection (subcutaneous)  5,000 Units Subcutaneous Q12H   latanoprost  1 drop Left Eye QHS   loratadine  10  mg Per Tube Daily   mouth rinse  15 mL Mouth Rinse 10 times per day   midodrine  10 mg Per Tube TID WC   OLANZapine  10 mg Per Tube QHS   oxyCODONE  5 mg Per Tube Q6H   pantoprazole sodium  40 mg Per Tube Daily   polyethylene glycol  17 g Per Tube Daily   sodium chloride flush  10-40 mL Intracatheter Q12H   traZODone  100 mg Per Tube QHS   Continuous Infusions:  sodium chloride Stopped (12/15/20 0000)   cefTRIAXone (ROCEPHIN)  IV Stopped (12/14/20 1800)   fentaNYL infusion INTRAVENOUS 400 mcg/hr (12/15/20 1000)   norepinephrine (LEVOPHED) Adult infusion Stopped (12/15/20 0420)   propofol (DIPRIVAN) infusion 70 mcg/kg/min (12/15/20 1000)   PRN Meds:.bisacodyl, fentaNYL, hydrOXYzine, midazolam, sodium chloride flush, sodium phosphate Medications Prior to Admission:  Prior to Admission medications   Medication Sig Start Date End Date Taking? Authorizing Provider  acetaZOLAMIDE ER (DIAMOX) 500 MG capsule Take 500 mg by mouth every other day. 12/07/20  Yes [provider]  amLODipine (NORVASC) 5 MG tablet Take 5 mg by mouth daily. 09/10/19  Yes [provider]  aspirin EC 81 MG tablet Take 81 mg by mouth daily. Swallow whole.   Yes [provider]   atropine 1 % ophthalmic solution Place 1 drop into the left eye 6 (six) times daily.   Yes [provider]  brimonidine (ALPHAGAN) 0.2 % ophthalmic solution Place 1 drop into the left eye 3 (three) times daily.   Yes [provider]  carbamazepine (TEGRETOL) 200 MG tablet Take 200 mg by mouth 3 (three) times daily. 09/10/19  Yes [provider]  carvedilol (COREG) 25 MG tablet Take 25 mg by mouth 2 (two) times daily with a meal.   Yes [provider]  cetirizine (ZYRTEC) 10 MG tablet Take 10 mg by mouth 2 (two) times daily.   Yes [provider]  cloNIDine (CATAPRES) 0.1 MG tablet Take 0.1 mg by mouth 3 (three) times daily. 0700, NOON, 1800   Yes [provider]  docusate sodium (COLACE) 100 MG capsule Take 100 mg by mouth 3 (three) times daily as needed for mild constipation or moderate constipation.   Yes [provider]  dorzolamide-timolol (COSOPT) 22.3-6.8 MG/ML ophthalmic solution Place 1 drop into the left eye 2 (two) times daily.   Yes [provider]  Emollient (CETAPHIL) cream Apply 1 application topically as directed. Apply after bath   Yes [provider]  escitalopram (LEXAPRO) 10 MG tablet Take 10 mg by mouth daily. 09/10/19  Yes [provider]  fesoterodine (TOVIAZ) 8 MG TB24 tablet Take 8 mg by mouth daily.   Yes [provider]  fluticasone (FLONASE) 50 MCG/ACT nasal spray Place 1 spray into both nostrils 2 (two) times daily. 09/19/19  Yes [provider]  lisinopril (ZESTRIL) 20 MG tablet Take 20 mg by mouth daily. 11/07/20  Yes [provider]  lisinopril-hydrochlorothiazide (ZESTORETIC) 20-12.5 MG tablet Take 1 tablet by mouth daily. 12/07/20  Yes [provider]  LORazepam (ATIVAN) 1 MG tablet Take 1 mg by mouth in the morning and at bedtime. (1700 & 2000)   Yes [provider]  pantoprazole (PROTONIX) 40 MG tablet Take 40 mg by mouth daily. 09/10/19   Yes [provider]  prednisoLONE acetate (PRED FORTE) 1 % ophthalmic suspension Place 1 drop into the left eye 6 (six) times daily.   Yes [provider]  risperiDONE (RISPERDAL) 0.5 MG tablet Take 1 mg by mouth See admin instructions. Take 2 tablets (1 mg) by mouth every morning and 2 tablets (1 mg) at bedtime   Yes [provider]  acetaminophen (TYLENOL) 325 MG tablet Take 650 mg by mouth every 4 (four) hours as needed for fever.    [provider]  bisacodyl (DULCOLAX) 5 MG EC tablet Take 10 mg by mouth every 12 (twelve) hours as needed for severe constipation.    [provider]  camphor-menthol Timoteo Ace) lotion Apply 1 application topically as needed for itching.    [provider]  carbamide peroxide (DEBROX) 6.5 % OTIC solution Place 5-10 drops into both ears every Friday.    [provider]  chlorhexidine (PERIDEX) 0.12 % solution Use as directed 5 mLs in the mouth or throat 2 (two) times daily.    [provider]  diphenhydrAMINE (BENADRYL) 25 mg capsule Take 25 mg by mouth every 6 (six) hours as needed.    [provider]  guaiFENesin (ROBITUSSIN) 100 MG/5ML SOLN Take 5 mLs by mouth every 4 (four) hours as needed for cough.    [provider]  hydrOXYzine (ATARAX/VISTARIL) 50 MG tablet Take 50 mg by mouth 3 (three) times daily as needed for itching or anxiety.    [provider]  latanoprost (XALATAN) 0.005 % ophthalmic solution Place 1 drop into the left eye at bedtime.    [provider]  levocetirizine (XYZAL) 5 MG tablet Take 5 mg by mouth at bedtime.  09/10/19   [provider]  loperamide (IMODIUM) 2 MG capsule Take 2-4 mg by mouth See admin instructions. Take 2 capsules (66m) by mouth after first loose stool then take 1 capsule (224m by mouth after each additional loose stool (PRN)    [provider]  LORazepam (ATIVAN) 1 MG tablet Take 1 mg by mouth daily as  needed for anxiety or sedation.    [provider]  metroNIDAZOLE (METROCREAM) 0.75 % cream Apply 1 application topically at bedtime. (Apply to face)    [provider]  naltrexone (DEPADE) 50 MG tablet Take 50 mg by mouth daily. Patient not taking: Reported on 12/09/2020    [provider]  neomycin-bacitracin-polymyxin (NEOSPORIN) ointment Apply 1 application topically daily as needed for wound care.    [provider]  OLANZapine (ZYPREXA) 10 MG tablet Take 10 mg by mouth at bedtime. 11/24/2020   [provider]  polyethylene glycol (MIRALAX / GLYCOLAX) 17 g packet Take 17 g by mouth daily.    [provider]  potassium chloride SA (KLOR-CON) 20 MEQ tablet Take 1 tablet (20 mEq total) by mouth daily. Patient not taking: Reported on 12/09/2020 10/02/20   DaEdwin DadaMD  tolnaftate (TINACTIN) 1 % cream Apply 1 application topically 2 (two) times daily as needed (itchy feet).    [provider]  traZODone (DESYREL) 50 MG tablet Take 50-125 mg by mouth 3 (three) times daily. Take one 50 mg tablet by mouth every morning, one 50 mg tablet at 2 PM, and two and one-half tablets 125 mg at bedtime    [provider]   No Known Allergies Review of Systems  Unable to perform ROS: Intubated   Physical Exam Vitals and nursing note reviewed.  Constitutional:      General: He is not in acute distress.    Appearance: He is ill-appearing.  HENT:     Head: Atraumatic.     Comments:  Temporal wasting Cardiovascular:     Rate and Rhythm: Normal rate.  Pulmonary:     Comments: Intubated/ventilated Musculoskeletal:        General: No swelling.  Neurological:     Comments: Intubated/ventilated    Vital Signs: BP 106/62   Pulse (!) 55   Temp 98.8 F (37.1 C) (Axillary)   Resp 16   Ht 5' (1.524 m)   Wt 70.5 kg   SpO2 97%   BMI 30.35 kg/m  Pain Scale: CPOT       SpO2: SpO2: 97 % O2 Device:SpO2: 97 % O2 Flow  Rate: .O2 Flow Rate (L/min): 2 L/min  IO: Intake/output summary:  Intake/Output Summary (Last 24 hours) at 12/15/2020 1203 Last data filed at 12/15/2020 1030 Gross per 24 hour  Intake 3017.27 ml  Output 1400 ml  Net 1617.27 ml    LBM: Last BM Date: 12/14/20 Baseline Weight: Weight: 70 kg Most recent weight: Weight: 70.5 kg     Palliative Assessment/Data:   Flowsheet Rows    Flowsheet Row Most Recent Value  Intake Tab   Referral Department Hospitalist  Unit at Time of Referral ICU  Palliative Care Primary Diagnosis Pulmonary  Date Notified 12/14/20  Palliative Care Type New Palliative care  Reason for referral Clarify Goals of Care  Date of Admission 11/13/2020  Date first seen by Palliative Care 12/15/20  # of days Palliative referral response time 1 Day(s)  # of days IP prior to Palliative referral 6  Clinical Assessment   Palliative Performance Scale Score 10%  Pain Max last 24 hours Not able to report  Pain Min Last 24 hours Not able to report  Dyspnea Max Last 24 Hours Not able to report  Dyspnea Min Last 24 hours Not able to report  Psychosocial & Spiritual Assessment   Palliative Care Outcomes        Time In: 1030 Time Out: 1120 Time Total: 50 minutes  Greater than 50%  of this time was spent counseling and coordinating care related to the above assessment and plan.  Signed by: Drue Novel, NP   Please contact Palliative Medicine Team phone at [phone] for questions and concerns.  For individual provider: See Shea Evans

## 2020-12-15 NOTE — Progress Notes (Signed)
[facility] Cardiology    SUBJECTIVE: Intubated sedated   Vitals:   12/15/20 0900 12/15/20 1000 12/15/20 1030 12/15/20 1100  BP: (!) 91/54 (!) 89/52 104/61 106/62  Pulse: (!) 51 (!) 49 (!) 52 (!) 55  Resp: 16 16 16 16   Temp:      TempSrc:      SpO2: 95% 95% 96% 97%  Weight:      Height:         Intake/Output Summary (Last 24 hours) at 12/15/2020 1220 Last data filed at 12/15/2020 1030 Gross per 24 hour  Intake 3017.27 ml  Output 1400 ml  Net 1617.27 ml      PHYSICAL EXAM  General: Well developed, well nourished, in no acute distress HEENT:  Normocephalic and atramatic Neck:  No JVD.  Lungs: Clear bilaterally to auscultation and percussion. Heart: HRRR . Normal S1 and S2 without gallops or murmurs.  Abdomen: Bowel sounds are positive, abdomen soft and non-tender  Msk:  Back normal, normal gait. Normal strength and tone for age. Extremities: No clubbing, cyanosis or edema.   Neuro: Alert and oriented X 3. Psych:  Good affect, responds appropriately   LABS: Basic Metabolic Panel: Recent Labs    12/13/20 0341 12/13/20 1253 12/14/20 0405 12/15/20 0441  NA 135   < > 139 130*  K 4.1  --  4.6 4.2  CL 106  --  107 100  CO2 25  --  27 24  GLUCOSE 152*  --  129* 113*  BUN 16  --  22 22  CREATININE 0.77  --  0.73 0.58*  CALCIUM 7.5*  --  7.8* 6.8*  MG 2.1  --  2.0  --   PHOS 3.0  --  3.4  --    < > = values in this interval not displayed.   Liver Function Tests: No results for input(s): AST, ALT, ALKPHOS, BILITOT, PROT, ALBUMIN in the last 72 hours. No results for input(s): LIPASE, AMYLASE in the last 72 hours. CBC: Recent Labs    12/14/20 0405 12/15/20 0441  WBC 8.2 5.7  HGB 9.2* 8.4*  HCT 27.5* 22.7*  MCV 98.6 100.9*  PLT 192 155   Cardiac Enzymes: No results for input(s): CKTOTAL, CKMB, CKMBINDEX, TROPONINI in the last 72 hours. BNP: Invalid input(s): POCBNP D-Dimer: No results for input(s): DDIMER in the last 72 hours. Hemoglobin A1C: No results for  input(s): HGBA1C in the last 72 hours. Fasting Lipid Panel: Recent Labs    12/13/20 0341  TRIG 198*   Thyroid Function Tests: No results for input(s): TSH, T4TOTAL, T3FREE, THYROIDAB in the last 72 hours.  Invalid input(s): FREET3 Anemia Panel: No results for input(s): VITAMINB12, FOLATE, FERRITIN, TIBC, IRON, RETICCTPCT in the last 72 hours.  DG Chest Port 1 View  Result Date: 12/15/2020 CLINICAL DATA:  Acute respiratory failure EXAM: PORTABLE CHEST 1 VIEW COMPARISON:  Chest radiograph 1 day prior FINDINGS: The endotracheal tube is in the lower thoracic trachea approximately 6 mm from the carina. The right upper extremity PICC tip is in the region of the cavoatrial junction. The enteric catheter tip is in the stomach. The cardiomediastinal silhouette is stable. Lung volumes are low but improved from prior. There are patchy opacities in the left lung base likely reflecting atelectasis. There is no other focal consolidation. There is no pulmonary edema. There is no pleural effusion or pneumothorax. The bones are stable. IMPRESSION: 1. Endotracheal tube tip is approximately 6 mm from the carina. Consider retraction by approximately 2  cm for more optimal positioning. 2. Improved lung volumes since the study from 1 day prior with residual opacities in the left lung base likely reflecting atelectasis. These results will be called to the ordering clinician or representative by the Radiologist Assistant, and communication documented in the PACS or [employer]. Electronically Signed   By: Lesia Hausen M.D.   On: 12/15/2020 12:03   DG Chest Port 1 View  Result Date: 12/14/2020 CLINICAL DATA:  Evaluate for aspiration. EXAM: PORTABLE CHEST 1 VIEW COMPARISON:  12/12/2020 FINDINGS: Right arm PICC line tip is at the cavoatrial junction. There is a NG tube with tip below the level of the GE junction. ET tube tip is above the carina. Lung volumes are very low and there is asymmetric elevation of the right  hemidiaphragm. Atelectasis is identified within both lung bases. IMPRESSION: Low lung volumes with asymmetric elevation of the right hemidiaphragm and bibasilar atelectasis. Electronically Signed   By: Signa Kell M.D.   On: 12/14/2020 16:09     Echo Normal LVF 50%  TELEMETRY: NSR 80 nsstw:  ASSESSMENT AND PLAN:  Principal Problem:   Acute hyponatremia Active Problems:   Intellectual disability   Epilepsy (HCC)   Hypertensive urgency   Agitation   Hypokalemia    Plan Continue ICU level care for respiratory failure Continue critical care management and control Agree with supplemental oxygen for hypoxemic respiratory failure Continue to correct electrolytes for hyponatremia History of seizure disorder reasonably stable Continue supportive care and wean when able    Alwyn Pea, MD, 12/15/2020 12:20 PM

## 2020-12-15 NOTE — Progress Notes (Signed)
Patient remains intubated and deeply sedated. Attempted to wean sedation and was unable to redirect patient when he aroused spontaneously. Fentanyl boluses administered and sedation returned to max rate. Levophed off since 0330, BP soft but stable. Lung sounds improved throughout the shift. Low grade temp noted, 99 axillary Tmax. Complete bed bath and linen change completed.

## 2020-12-16 DIAGNOSIS — E871 Hypo-osmolality and hyponatremia: Secondary | ICD-10-CM | POA: Diagnosis not present

## 2020-12-16 DIAGNOSIS — R451 Restlessness and agitation: Secondary | ICD-10-CM | POA: Diagnosis not present

## 2020-12-16 DIAGNOSIS — Z515 Encounter for palliative care: Secondary | ICD-10-CM | POA: Diagnosis not present

## 2020-12-16 DIAGNOSIS — E876 Hypokalemia: Secondary | ICD-10-CM | POA: Diagnosis not present

## 2020-12-16 DIAGNOSIS — Z7189 Other specified counseling: Secondary | ICD-10-CM | POA: Diagnosis not present

## 2020-12-16 DIAGNOSIS — G40909 Epilepsy, unspecified, not intractable, without status epilepticus: Secondary | ICD-10-CM | POA: Diagnosis not present

## 2020-12-16 DIAGNOSIS — J9601 Acute respiratory failure with hypoxia: Secondary | ICD-10-CM | POA: Diagnosis not present

## 2020-12-16 LAB — CBC
HCT: 27.3 % — ABNORMAL LOW (ref 39.0–52.0)
Hemoglobin: 9.2 g/dL — ABNORMAL LOW (ref 13.0–17.0)
MCH: 33.2 pg (ref 26.0–34.0)
MCHC: 33.7 g/dL (ref 30.0–36.0)
MCV: 98.6 fL (ref 80.0–100.0)
Platelets: 206 10*3/uL (ref 150–400)
RBC: 2.77 MIL/uL — ABNORMAL LOW (ref 4.22–5.81)
RDW: 12.8 % (ref 11.5–15.5)
WBC: 7.9 10*3/uL (ref 4.0–10.5)
nRBC: 0 % (ref 0.0–0.2)

## 2020-12-16 LAB — BASIC METABOLIC PANEL
Anion gap: 3 — ABNORMAL LOW (ref 5–15)
BUN: 31 mg/dL — ABNORMAL HIGH (ref 8–23)
CO2: 31 mmol/L (ref 22–32)
Calcium: 7.7 mg/dL — ABNORMAL LOW (ref 8.9–10.3)
Chloride: 104 mmol/L (ref 98–111)
Creatinine, Ser: 0.7 mg/dL (ref 0.61–1.24)
GFR, Estimated: >60 mL/min (ref >60)
Glucose, Bld: 116 mg/dL — ABNORMAL HIGH (ref 70–99)
Potassium: 3.8 mmol/L (ref 3.5–5.1)
Sodium: 138 mmol/L (ref 135–145)

## 2020-12-16 LAB — GLUCOSE, CAPILLARY
Glucose-Capillary: 106 mg/dL — ABNORMAL HIGH (ref 70–99)
Glucose-Capillary: 107 mg/dL — ABNORMAL HIGH (ref 70–99)
Glucose-Capillary: 108 mg/dL — ABNORMAL HIGH (ref 70–99)
Glucose-Capillary: 97 mg/dL (ref 70–99)

## 2020-12-16 LAB — TRIGLYCERIDES: Triglycerides: 362 mg/dL — ABNORMAL HIGH (ref <150)

## 2020-12-16 NOTE — Progress Notes (Signed)
Palliative: Ryan Mayer is lying quietly in bed.  He is intubated/ventilated and sedated.  He appears acutely/chronically ill and quite frail.  Legal guardian, Ryan Mayer is at bedside.  She shares that she is spoken with critical care attending and their goal is to have extubation 10/8 around 10 AM.  At this point, this will not be compassionate extubation.  The goal is for continued treatment in the hopes that Ryan Mayer will have some recovery.  Conference with CCM attending, CCM NP, bedside nursing staff related to patient condition, needs, goals of care, plan for extubation 10/8.  Plan: At this point continue to treat the treatable but no CPR/defibrillation.  Extubation planned 12/8 around 10 AM. Prognosis: Guarded at this point.  Hours to days would not be surprising.  25 minutes Lillia Carmel, NP Palliative medicine team Team phone [phone] Greater than 50% of this time was spent counseling and coordinating care related to the above assessment and plan.

## 2020-12-16 NOTE — Progress Notes (Signed)
[facility]    SUBJECTIVE: Intubated sedated   Vitals:   12/16/20 0809 12/16/20 0827 12/16/20 0900 12/16/20 1134  BP:   93/60   Pulse:   (!) 57   Resp:   16   Temp:  98.4 F (36.9 C)    TempSrc:  Oral    SpO2: 96%  96% 97%  Weight:      Height:         Intake/Output Summary (Last 24 hours) at 12/16/2020 1228 Last data filed at 12/16/2020 0900 Gross per 24 hour  Intake 3190.71 ml  Output 3355 ml  Net -164.29 ml      PHYSICAL EXAM  General: Well developed, well nourished, in no acute distress HEENT:  Normocephalic and atramatic Neck:  No JVD.  Lungs: Clear bilaterally to auscultation and percussion. Heart: HRRR . Normal S1 and S2 without gallops or murmurs.  Abdomen: Bowel sounds are positive, abdomen soft and non-tender  Msk:  Back normal, normal gait. Normal strength and tone for age. Extremities: No clubbing, cyanosis or edema.   Neuro: Alert and oriented X 3. Psych:  Good affect, responds appropriately   LABS: Basic Metabolic Panel: Recent Labs    12/14/20 0405 12/15/20 0441 12/16/20 0508  NA 139 130* 138  K 4.6 4.2 3.8  CL 107 100 104  CO2 27 24 31   GLUCOSE 129* 113* 116*  BUN 22 22 31*  CREATININE 0.73 0.58* 0.70  CALCIUM 7.8* 6.8* 7.7*  MG 2.0  --   --   PHOS 3.4  --   --    Liver Function Tests: No results for input(s): AST, ALT, ALKPHOS, BILITOT, PROT, ALBUMIN in the last 72 hours. No results for input(s): LIPASE, AMYLASE in the last 72 hours. CBC: Recent Labs    12/15/20 0441 12/16/20 0508  WBC 5.7 7.9  HGB 8.4* 9.2*  HCT 22.7* 27.3*  MCV 100.9* 98.6  PLT 155 206   Cardiac Enzymes: No results for input(s): CKTOTAL, CKMB, CKMBINDEX, TROPONINI in the last 72 hours. BNP: Invalid input(s): POCBNP D-Dimer: No results for input(s): DDIMER in the last 72 hours. Hemoglobin A1C: No results for input(s): HGBA1C in the last 72 hours. Fasting Lipid Panel: Recent Labs    12/16/20 0508  TRIG 362*   Thyroid Function Tests: No  results for input(s): TSH, T4TOTAL, T3FREE, THYROIDAB in the last 72 hours.  Invalid input(s): FREET3 Anemia Panel: No results for input(s): VITAMINB12, FOLATE, FERRITIN, TIBC, IRON, RETICCTPCT in the last 72 hours.  DG Chest Port 1 View  Result Date: 12/15/2020 CLINICAL DATA:  Acute respiratory failure EXAM: PORTABLE CHEST 1 VIEW COMPARISON:  Chest radiograph 1 day prior FINDINGS: The endotracheal tube is in the lower thoracic trachea approximately 6 mm from the carina. The right upper extremity PICC tip is in the region of the cavoatrial junction. The enteric catheter tip is in the stomach. The cardiomediastinal silhouette is stable. Lung volumes are low but improved from prior. There are patchy opacities in the left lung base likely reflecting atelectasis. There is no other focal consolidation. There is no pulmonary edema. There is no pleural effusion or pneumothorax. The bones are stable. IMPRESSION: 1. Endotracheal tube tip is approximately 6 mm from the carina. Consider retraction by approximately 2 cm for more optimal positioning. 2. Improved lung volumes since the study from 1 day prior with residual opacities in the left lung base likely reflecting atelectasis. These results will be called to the ordering clinician or representative by the Radiologist Assistant,  and communication documented in the PACS or [employer]. Electronically Signed   By: Lesia Hausen M.D.   On: 12/15/2020 12:03   DG Chest Port 1 View  Result Date: 12/14/2020 CLINICAL DATA:  Evaluate for aspiration. EXAM: PORTABLE CHEST 1 VIEW COMPARISON:  12/12/2020 FINDINGS: Right arm PICC line tip is at the cavoatrial junction. There is a NG tube with tip below the level of the GE junction. ET tube tip is above the carina. Lung volumes are very low and there is asymmetric elevation of the right hemidiaphragm. Atelectasis is identified within both lung bases. IMPRESSION: Low lung volumes with asymmetric elevation of the right  hemidiaphragm and bibasilar atelectasis. Electronically Signed   By: Signa Kell M.D.   On: 12/14/2020 16:09       TELEMETRY: Sinus tachycardia rate of 110  ASSESSMENT AND PLAN:  Principal Problem:   Acute hyponatremia Active Problems:   Intellectual disability   Epilepsy (HCC)   Hypertensive urgency   Agitation   Hypokalemia    Plan Respiratory failure continue respiratory support with vent wean if and when appropriate continue critical care management Correct electrolytes including hyponatremia Continue current therapy for seizure disorder Hypertension management with history of urgency and poorly controlled hypertension Sedation as necessary for agitation Overall prognosis is poor with inability to wean from the vent Prognosis poor   Alwyn Pea, MD 12/16/2020 12:28 PM

## 2020-12-16 NOTE — Progress Notes (Signed)
NAME:  Ryan Mayer, MRN:  [id number], DOB:  [birth year], LOS: 7 ADMISSION DATE:  11/21/2020, CONSULTATION DATE:  12/09/2020 REFERRING MD:  Dr. Cyndia Skeeters, CHIEF COMPLAINT:  Acute Respiratory Distress   Brief Pt Description / Synopsis:  65 yo with PMH of intellectual disability, agitation, epilepsy admitted with Hypertensive Urgency, severe acute Hyponatremia (? SIADH), agitation, and elevated troponin (demand ischemia vs NSTEMI). Developed Acute Hypoxic Respiratory Failure on 11/30 due to aspiration of bloody secretions leading to asphyxia requiring emergent intubation.  Pertinent  Medical History  Intellectual disability Seizures Hypertension  Micro Data:  11/24/2020: SARS-CoV-2 and influenza PCR>> negative 12/09/2020: MRSA PCR>> negative 12/11/2020: Tracheal aspirate>>SERRATIA MARCESCENS (Resistant to Cefazolin)  Antimicrobials:  Unasyn 12/2>>12/5 Ceftriaxone 12/5>>  Significant Hospital Events: Including procedures, antibiotic start and stop dates in addition to other pertinent events   11/29: Admitted by Hospitalist 11/30 Developed acute hypoxic respiratory failure due to aspiration, emergently intubated,  12/1 remains on vent, severe resp failure 12/2: Extubated.  However required reintubation shortly after due to concern for aspiration and hypoxia.  Unasyn started.  Plan for bronchoscopy and PICC line placement 12/3: remains on vent,+aspiration pneumonia 12/5: Tracheal aspirate growing Serratia Marcescens, ABX changed to Ceftriaxone.  Prior to pursing SBT, needs follow up with POA regarding [place] and whether to pursue Trach if pt's fails extubation again 12/6: Increase in vent support from yesterday (50% FiO2, 12 PEEP), CXR with bilateral atelectasis and low lung volumes. Na decreased to 130, plan for Lasix x1 dose.  Met with Otho Perl at bedside, plan for compassionate extubation once all staff members have had a chance to visit 12/7: FiO2 weaned to 40%, weaning PEEP as tolerated.   Difficult to keep sedated. Anticipate one way extubation on 12/8 once all family has had chance to visit  Interim History / Subjective:  -No acute events noted overnight -Low grade temperature (t max 100.2), requiring 4 mcg Levophed -FiO2 weaned to 40% this morning, will PEEP as tolerated (currently 10) -Difficult to keep sedated -PLAN FOR 1 way EXTUBATION ONCE ALL STAFF MEMBERS FROM HIS FACILITY HAVE HAD A CHANCE TO VISIT ~ anticipate this will occur 12/8 ~ if pt does not tolerate/declines will transition to COMFORT MEASURES   Objective   Blood pressure (!) 77/44, pulse 66, temperature 98.4 F (36.9 C), temperature source Oral, resp. rate 16, height 5' (1.524 m), weight 69.3 kg, SpO2 94 %.    Vent Mode: PRVC FiO2 (%):  [30 %-40 %] 30 % Set Rate:  [10 bmp-16 bmp] 16 bmp Vt Set:  [450 mL] 450 mL PEEP:  [5 cmH20-10 cmH20] 5 cmH20 Plateau Pressure:  [16 cmH20-18 cmH20] 16 cmH20   Intake/Output Summary (Last 24 hours) at 12/16/2020 1303 Last data filed at 12/16/2020 1300 Gross per 24 hour  Intake 3366.93 ml  Output 3230 ml  Net 136.93 ml    Filed Weights   12/14/20 0448 12/15/20 0444 12/16/20 0334  Weight: 68.5 kg 70.5 kg 69.3 kg    Examination: General: Acutely ill-appearing male, laying in bed, intubated and sedated, no acute distress HENT: Atraumatic, normocephalic, neck supple, no JVD Lungs: Coarse breath sounds bilaterally, synchronous with the vent, even Cardiovascular: Bradycardia, Regular rhythm, S1-S2, no murmurs, rubs, gallops, 2+ distal pulses Abdomen: Soft, nontender, nondistended, no guarding rebound tenderness, bowel sounds positive all 4 Extremities: Normal bulk and tone, no deformities, no edema Neuro: Patient currently sedated (RASS -2), when sedation is lightened patient is extremely agitated and delirious trying to self extubate and get out of  bed, pupils unequal (left 5 mm, right 2 mm) GU: Foley catheter in place  Resolved Hospital Problem list      Assessment & Plan:   Acute Hypoxic Respiratory Failure due to Aspiration -Extubated 12/11/2020, however required reintubation 12/2 shortly after -Full vent support, implement lung protective strategies -Plateau pressures less than 30 cm H20 -Wean FiO2 & PEEP as tolerated to maintain O2 sats >92% -Follow intermittent Chest X-ray & ABG as needed -Spontaneous Breathing Trials when respiratory parameters met and mental status permits -Implement VAP Bundle -Prn Bronchodilators -Continue Ceftriaxone -PLAN FOR 1 way EXTUBATION ONCE ALL STAFF MEMBERS FROM HIS FACILITY HAVE HAD A CHANCE TO VISIT ~ anticipate this will occur 12/8 ~ if pt does not tolerate/declines will transition to COMFORT MEASURES  Hypotension, ? septic shock vs Sedation related Elevated Troponin, demand ischemia vs NSTEMI Hypertensive Urgency (earlier in hospital stay) -Continuous cardiac monitoring -Maintain MAP >65 -Vasopressors as needed to maintain MAP goal ~ currently weaned off -Continue Midodrine -Lactic acid normalized -HS Troponin peaked at 1162 -Echocardiogram 12/10/20: LVEF 2-69%, Grade I diastolic dysfunction, RV systolic function normal -Cardiology following, appreciate input -Continue ASA 81 mg daily -Hold antihypertensives due to requirement for vasopressors  Concern for Aspiration Pneumonia ~ Tracheal Aspirate with Serratia Marcescens -Monitor fever curve -Trend WBC's & Procalcitonin -Follow cultures as above -Continue Ceftriaxone  Hyponatremia, ? SIADH (pt is on Tegretol and multiple antipsychotic medications) ~ RESOLVED -Monitor I&O's / urinary output -Follow BMP -Ensure adequate renal perfusion -Avoid nephrotoxic agents as able -Replace electrolytes as indicated  Acute Metabolic Encephalopathy Intellectual disability/agitation Sedation needs in setting of mechanical ventilation PMHx: Seizure disorder/epilepsy -Maintain a RASS goal of 0 to -1 -Fentanyl and Propofol drips as needed to  maintain RASS goal -Avoid sedating medications as able -Daily wake up assessment -CT Head 12/1 negative for acute intracranial abnormality -Continue Tegretol -Prn Benzodiazepines -Continue scheduled Clonazepam and Oxycodone to assist with decreasing IV infusion requirements      Best Practice (right click and "Reselect all SmartList Selections" daily)   Diet/type: tubefeeds and NPO DVT prophylaxis: Heparin SQ GI prophylaxis: PPI Lines: Right arm PICC line, and it is still needed Foley:  Yes, and it is still needed Code Status: DNR/DNI Last date of multidisciplinary goals of care discussion [12/16/2020]  Labs   CBC: Recent Labs  Lab 12/12/20 0327 12/13/20 0341 12/14/20 0405 12/15/20 0441 12/16/20 0508  WBC 12.1* 7.6 8.2 5.7 7.9  HGB 10.7* 9.6* 9.2* 8.4* 9.2*  HCT 30.1* 28.1* 27.5* 22.7* 27.3*  MCV 95.0 96.2 98.6 100.9* 98.6  PLT 208 181 192 155 206     Basic Metabolic Panel: Recent Labs  Lab 12/10/20 0733 12/10/20 1433 12/11/20 0423 12/11/20 1608 12/11/20 2256 12/12/20 0208 12/12/20 1156 12/13/20 0341 12/13/20 1253 12/13/20 1920 12/14/20 0405 12/15/20 0441 12/16/20 0508  NA 127*   < > 132* 131*   < > 132*   < > 135 139 138 139 130* 138  K 2.8*  --  4.2  --   --  4.3  --  4.1  --   --  4.6 4.2 3.8  CL 98  --  108  --   --  104  --  106  --   --  107 100 104  CO2 22  --  22  --   --  22  --  25  --   --  27 24 31   GLUCOSE 126*  --  119*  --   --  158*  --  152*  --   --  129* 113* 116*  BUN 13  --  15  --   --  15  --  16  --   --  22 22 31*  CREATININE 0.75  --  0.88  --   --  0.81  --  0.77  --   --  0.73 0.58* 0.70  CALCIUM 7.6*  --  7.7*  --   --  7.7*  --  7.5*  --   --  7.8* 6.8* 7.7*  MG 2.2  --   --   --   --  2.1  --  2.1  --   --  2.0  --   --   PHOS 3.2  3.1   < > 2.5 2.4*  --  3.5  --  3.0  --   --  3.4  --   --    < > = values in this interval not displayed.    GFR: Estimated Creatinine Clearance: 77.1 mL/min (by C-G formula based on SCr  of 0.7 mg/dL). Recent Labs  Lab 12/13/20 0341 12/14/20 0405 12/15/20 0441 12/16/20 0508  WBC 7.6 8.2 5.7 7.9     Liver Function Tests: Recent Labs  Lab 12/10/20 0733 12/11/20 0441  AST  --  43*  ALT  --  22  ALKPHOS  --  57  BILITOT  --  0.3  PROT  --  5.5*  ALBUMIN 2.9* 2.7*    No results for input(s): LIPASE, AMYLASE in the last 168 hours. No results for input(s): AMMONIA in the last 168 hours.  ABG    Component Value Date/Time   PHART 7.36 12/09/2020 1835   PCO2ART 42 12/09/2020 1835   PO2ART 136 (H) 12/09/2020 1835   HCO3 23.7 12/09/2020 1835   ACIDBASEDEF 1.7 12/09/2020 1835   O2SAT 99.0 12/09/2020 1835      Coagulation Profile: No results for input(s): INR, PROTIME in the last 168 hours.  Cardiac Enzymes: Recent Labs  Lab 12/09/20 1516 12/10/20 0733  CKTOTAL 462* 2,632*     HbA1C: No results found for: HGBA1C  CBG: Recent Labs  Lab 12/15/20 1922 12/15/20 2347 12/16/20 0356 12/16/20 0813 12/16/20 1153  GLUCAP 117* 106* 108* 107* 106*     Review of Systems:   Unable to assess due to Intubation & sedation   Past Medical History:  He,  has a past medical history of Hypertension, Intellectual disability, and Seizure ([place]).   Surgical History:  History reviewed. No pertinent surgical history.   Social History:   reports that he has never smoked. He has never used smokeless tobacco. He reports that he does not drink alcohol and does not use drugs.   Family History:  His family history is not on file.   Allergies No Known Allergies   Home Medications  Prior to Admission medications   Medication Sig Start Date End Date Taking? Authorizing Provider  acetaZOLAMIDE ER (DIAMOX) 500 MG capsule Take 500 mg by mouth every other day. 12/07/20  Yes [provider]  amLODipine (NORVASC) 5 MG tablet Take 5 mg by mouth daily. 09/10/19  Yes [provider]  aspirin EC 81 MG tablet Take 81 mg by mouth daily. Swallow whole.    Yes [provider]  atropine 1 % ophthalmic solution Place 1 drop into the left eye 6 (six) times daily.   Yes [provider]  brimonidine (ALPHAGAN) 0.2 % ophthalmic solution Place 1 drop into the  left eye 3 (three) times daily.   Yes [provider]  carbamazepine (TEGRETOL) 200 MG tablet Take 200 mg by mouth 3 (three) times daily. 09/10/19  Yes [provider]  carvedilol (COREG) 25 MG tablet Take 25 mg by mouth 2 (two) times daily with a meal.   Yes [provider]  cetirizine (ZYRTEC) 10 MG tablet Take 10 mg by mouth 2 (two) times daily.   Yes [provider]  cloNIDine (CATAPRES) 0.1 MG tablet Take 0.1 mg by mouth 3 (three) times daily. 0700, NOON, 1800   Yes [provider]  docusate sodium (COLACE) 100 MG capsule Take 100 mg by mouth 3 (three) times daily as needed for mild constipation or moderate constipation.   Yes [provider]  dorzolamide-timolol (COSOPT) 22.3-6.8 MG/ML ophthalmic solution Place 1 drop into the left eye 2 (two) times daily.   Yes [provider]  Emollient (CETAPHIL) cream Apply 1 application topically as directed. Apply after bath   Yes [provider]  escitalopram (LEXAPRO) 10 MG tablet Take 10 mg by mouth daily. 09/10/19  Yes [provider]  fesoterodine (TOVIAZ) 8 MG TB24 tablet Take 8 mg by mouth daily.   Yes [provider]  fluticasone (FLONASE) 50 MCG/ACT nasal spray Place 1 spray into both nostrils 2 (two) times daily. 09/19/19  Yes [provider]  lisinopril (ZESTRIL) 20 MG tablet Take 20 mg by mouth daily. 11/07/20  Yes [provider]  lisinopril-hydrochlorothiazide (ZESTORETIC) 20-12.5 MG tablet Take 1 tablet by mouth daily. 12/07/20  Yes [provider]  LORazepam (ATIVAN) 1 MG tablet Take 1 mg by mouth in the morning and at bedtime. (1700 & 2000)   Yes [provider]  pantoprazole (PROTONIX) 40 MG tablet Take  40 mg by mouth daily. 09/10/19  Yes [provider]  prednisoLONE acetate (PRED FORTE) 1 % ophthalmic suspension Place 1 drop into the left eye 6 (six) times daily.   Yes [provider]  risperiDONE (RISPERDAL) 0.5 MG tablet Take 1 mg by mouth See admin instructions. Take 2 tablets (1 mg) by mouth every morning and 2 tablets (1 mg) at bedtime   Yes [provider]  acetaminophen (TYLENOL) 325 MG tablet Take 650 mg by mouth every 4 (four) hours as needed for fever.    [provider]  bisacodyl (DULCOLAX) 5 MG EC tablet Take 10 mg by mouth every 12 (twelve) hours as needed for severe constipation.    [provider]  camphor-menthol Timoteo Ace) lotion Apply 1 application topically as needed for itching.    [provider]  carbamide peroxide (DEBROX) 6.5 % OTIC solution Place 5-10 drops into both ears every Friday.    [provider]  chlorhexidine (PERIDEX) 0.12 % solution Use as directed 5 mLs in the mouth or throat 2 (two) times daily.    [provider]  diphenhydrAMINE (BENADRYL) 25 mg capsule Take 25 mg by mouth every 6 (six) hours as needed.    [provider]  guaiFENesin (ROBITUSSIN) 100 MG/5ML SOLN Take 5 mLs by mouth every 4 (four) hours as needed for cough.    [provider]  hydrOXYzine (ATARAX/VISTARIL) 50 MG tablet Take 50 mg by mouth 3 (three) times daily as needed for itching or anxiety.    [provider]  latanoprost (XALATAN) 0.005 % ophthalmic solution Place 1 drop into the left eye at bedtime.    [provider]  levocetirizine (XYZAL) 5 MG tablet Take  5 mg by mouth at bedtime.  09/10/19   [provider]  loperamide (IMODIUM) 2 MG capsule Take 2-4 mg by mouth See admin instructions. Take 2 capsules (34m) by mouth after first loose stool then take 1 capsule (228m by mouth after each additional loose stool (PRN)    [provider]  LORazepam (ATIVAN) 1 MG tablet  Take 1 mg by mouth daily as needed for anxiety or sedation.    [provider]  metroNIDAZOLE (METROCREAM) 0.75 % cream Apply 1 application topically at bedtime. (Apply to face)    [provider]  naltrexone (DEPADE) 50 MG tablet Take 50 mg by mouth daily. Patient not taking: Reported on 12/09/2020    [provider]  neomycin-bacitracin-polymyxin (NEOSPORIN) ointment Apply 1 application topically daily as needed for wound care.    [provider]  OLANZapine (ZYPREXA) 10 MG tablet Take 10 mg by mouth at bedtime. 11/14/2020   [provider]  polyethylene glycol (MIRALAX / GLYCOLAX) 17 g packet Take 17 g by mouth daily.    [provider]  potassium chloride SA (KLOR-CON) 20 MEQ tablet Take 1 tablet (20 mEq total) by mouth daily. Patient not taking: Reported on 12/09/2020 10/02/20   DaEdwin DadaMD  tolnaftate (TINACTIN) 1 % cream Apply 1 application topically 2 (two) times daily as needed (itchy feet).    [provider]  traZODone (DESYREL) 50 MG tablet Take 50-125 mg by mouth 3 (three) times daily. Take one 50 mg tablet by mouth every morning, one 50 mg tablet at 2 PM, and two and one-half tablets 125 mg at bedtime    [provider]     Critical care time: 40 minutes     JeDarel HongAGACNP-BC [facility] Gardenpic messenger for cross cover needs If after hours, please call E-link

## 2020-12-17 DIAGNOSIS — G40909 Epilepsy, unspecified, not intractable, without status epilepticus: Secondary | ICD-10-CM | POA: Diagnosis not present

## 2020-12-17 DIAGNOSIS — E871 Hypo-osmolality and hyponatremia: Secondary | ICD-10-CM | POA: Diagnosis not present

## 2020-12-17 DIAGNOSIS — J9601 Acute respiratory failure with hypoxia: Secondary | ICD-10-CM | POA: Diagnosis not present

## 2020-12-17 DIAGNOSIS — Z7189 Other specified counseling: Secondary | ICD-10-CM | POA: Diagnosis not present

## 2020-12-17 DIAGNOSIS — R451 Restlessness and agitation: Secondary | ICD-10-CM | POA: Diagnosis not present

## 2020-12-17 DIAGNOSIS — Z515 Encounter for palliative care: Secondary | ICD-10-CM | POA: Diagnosis not present

## 2020-12-17 LAB — CBC
HCT: 27.5 % — ABNORMAL LOW (ref 39.0–52.0)
Hemoglobin: 9.2 g/dL — ABNORMAL LOW (ref 13.0–17.0)
MCH: 33.1 pg (ref 26.0–34.0)
MCHC: 33.5 g/dL (ref 30.0–36.0)
MCV: 98.9 fL (ref 80.0–100.0)
Platelets: 208 10*3/uL (ref 150–400)
RBC: 2.78 MIL/uL — ABNORMAL LOW (ref 4.22–5.81)
RDW: 12.5 % (ref 11.5–15.5)
WBC: 5.9 10*3/uL (ref 4.0–10.5)
nRBC: 0 % (ref 0.0–0.2)

## 2020-12-17 LAB — GLUCOSE, CAPILLARY
Glucose-Capillary: 115 mg/dL — ABNORMAL HIGH (ref 70–99)
Glucose-Capillary: 99 mg/dL (ref 70–99)
Glucose-Capillary: 104 mg/dL — ABNORMAL HIGH (ref 70–99)
Glucose-Capillary: 317 mg/dL — ABNORMAL HIGH (ref 70–99)
Glucose-Capillary: 107 mg/dL — ABNORMAL HIGH (ref 70–99)
Glucose-Capillary: 101 mg/dL — ABNORMAL HIGH (ref 70–99)

## 2020-12-17 LAB — BASIC METABOLIC PANEL
Anion gap: 8 (ref 5–15)
BUN: 24 mg/dL — ABNORMAL HIGH (ref 8–23)
CO2: 28 mmol/L (ref 22–32)
Calcium: 7.8 mg/dL — ABNORMAL LOW (ref 8.9–10.3)
Chloride: 102 mmol/L (ref 98–111)
Creatinine, Ser: 0.53 mg/dL — ABNORMAL LOW (ref 0.61–1.24)
GFR, Estimated: >60 mL/min (ref >60)
Glucose, Bld: 115 mg/dL — ABNORMAL HIGH (ref 70–99)
Potassium: 3.7 mmol/L (ref 3.5–5.1)
Sodium: 138 mmol/L (ref 135–145)

## 2020-12-17 MED ORDER — ONDANSETRON 4 MG PO TBDP
4.0000 mg | ORAL_TABLET | Freq: Four times a day (QID) | ORAL | Status: DC | PRN
  Filled 2020-12-17: qty 1

## 2020-12-17 MED ORDER — MORPHINE SULFATE (PF) 4 MG/ML IV SOLN: 4.0000 mg | Freq: Once | INTRAVENOUS | Status: DC

## 2020-12-17 MED ORDER — MORPHINE BOLUS VIA INFUSION
2.0000 mg | INTRAVENOUS | Status: DC | PRN
  Administered 2020-12-17: 2 mg via INTRAVENOUS
  Filled 2020-12-17: qty 2

## 2020-12-17 MED ORDER — MORPHINE BOLUS VIA INFUSION
2.0000 mg | INTRAVENOUS | Status: DC | PRN
Start: 2020-12-17 — End: 2020-12-18
  Administered 2020-12-17 (×3): 4 mg via INTRAVENOUS
  Filled 2020-12-17: qty 4

## 2020-12-17 MED ORDER — ACETAMINOPHEN 325 MG PO TABS: 650.0000 mg | ORAL_TABLET | Freq: Four times a day (QID) | ORAL | Status: DC | PRN

## 2020-12-17 MED ORDER — MORPHINE SULFATE (PF) 4 MG/ML IV SOLN
4.0000 mg | Freq: Once | INTRAVENOUS | Status: AC
  Administered 2020-12-17: 4 mg via INTRAVENOUS

## 2020-12-17 MED ORDER — HALOPERIDOL LACTATE 5 MG/ML IJ SOLN
0.5000 mg | INTRAMUSCULAR | Status: DC | PRN
  Administered 2020-12-17 (×2): 0.5 mg via INTRAVENOUS
  Filled 2020-12-17 (×2): qty 1

## 2020-12-17 MED ORDER — LORAZEPAM 2 MG/ML IJ SOLN
2.0000 mg | INTRAMUSCULAR | Status: DC | PRN
Start: 2020-12-17 — End: 2020-12-17
  Administered 2020-12-17: 2 mg via INTRAVENOUS
  Filled 2020-12-17: qty 2
  Filled 2020-12-17: qty 1

## 2020-12-17 MED ORDER — LORAZEPAM 2 MG/ML IJ SOLN
4.0000 mg | Freq: Once | INTRAMUSCULAR | Status: AC
  Administered 2020-12-17: 4 mg via INTRAVENOUS

## 2020-12-17 MED ORDER — MORPHINE BOLUS VIA INFUSION
2.0000 mg | INTRAVENOUS | Status: DC | PRN
  Administered 2020-12-17 (×2): 2 mg via INTRAVENOUS
  Filled 2020-12-17: qty 2

## 2020-12-17 MED ORDER — POLYVINYL ALCOHOL 1.4 % OP SOLN
1.0000 [drp] | Freq: Four times a day (QID) | OPHTHALMIC | Status: DC | PRN
  Filled 2020-12-17: qty 15

## 2020-12-17 MED ORDER — MORPHINE 100MG IN NS 100ML (1MG/ML) PREMIX INFUSION
1.0000 mg/h | INTRAVENOUS | Status: DC
  Administered 2020-12-17: 20 mg/h via INTRAVENOUS
  Administered 2020-12-17: 4 mg/h via INTRAVENOUS
  Filled 2020-12-17 (×2): qty 100

## 2020-12-17 MED ORDER — GLYCOPYRROLATE 0.2 MG/ML IJ SOLN
0.2000 mg | INTRAMUSCULAR | Status: DC | PRN
  Administered 2020-12-17 (×2): 0.2 mg via INTRAVENOUS
  Filled 2020-12-17: qty 1

## 2020-12-17 MED ORDER — GLYCOPYRROLATE 1 MG PO TABS
1.0000 mg | ORAL_TABLET | ORAL | Status: DC | PRN
  Filled 2020-12-17: qty 1

## 2020-12-17 MED ORDER — ACETAMINOPHEN 650 MG RE SUPP: 650.0000 mg | Freq: Four times a day (QID) | RECTAL | Status: DC | PRN

## 2020-12-17 MED ORDER — HALOPERIDOL LACTATE 2 MG/ML PO CONC
0.5000 mg | ORAL | Status: DC | PRN
  Filled 2020-12-17: qty 0.3

## 2020-12-17 MED ORDER — LORAZEPAM 2 MG/ML IJ SOLN
2.0000 mg | INTRAMUSCULAR | Status: DC | PRN
Start: 2020-12-17 — End: 2020-12-18
  Administered 2020-12-17 (×2): 4 mg via INTRAVENOUS
  Filled 2020-12-17 (×2): qty 2

## 2020-12-17 MED ORDER — ONDANSETRON HCL 4 MG/2ML IJ SOLN: 4.0000 mg | Freq: Four times a day (QID) | INTRAMUSCULAR | Status: DC | PRN

## 2020-12-17 MED ORDER — GLYCOPYRROLATE 0.2 MG/ML IJ SOLN
0.2000 mg | INTRAMUSCULAR | Status: DC | PRN
  Filled 2020-12-17: qty 1

## 2020-12-17 MED ORDER — HALOPERIDOL 0.5 MG PO TABS
0.5000 mg | ORAL_TABLET | ORAL | Status: DC | PRN
  Filled 2020-12-17: qty 1

## 2020-12-17 MED ORDER — BIOTENE DRY MOUTH MT LIQD: 15.0000 mL | OROMUCOSAL | Status: DC | PRN

## 2021-01-10 NOTE — Progress Notes (Signed)
Palliative: Ryan Mayer is surrounded by his friends/family from the group home.  At this time, legal guardian is requesting as extubation in the hopes of some recovery.  Sedation has been reduced.  Ryan Mayer is extubated, he continues to be supported by friends/family.  He continues to be agitated.  Legal guardian elects comfort care.  Orders adjusted.  Anticipate in-hospital death.  Conference with CCM attending, CCM NP, bedside nursing staff related to patient condition, needs, goals of care.   Plan: Comfort and dignity at end-of-life, let nature take its course.  Too unstable for transport, anticipate in-hospital death. Prognosis: Hours to days.  35 minutes Lillia Carmel, NP Palliative medicine team Team phone [phone] Greater than 50% of this time was spent counseling and coordinating care related to the above assessment and plan.

## 2021-01-10 NOTE — Progress Notes (Signed)
Post extubation, pt now with gurgling respirations, increased work of breathing, and O2 saturations dropping into the 60's, pt is very restless.  Concern for ongoing aspiration. Pt is suffering and in the dying process.  Pt's legal guardian Toniann Fail and multiple staff members from his facility are at bedside.  They are in agreement that pt is declining, and do not wish for him to suffer any longer. Prior plan was that if pt did not tolerate extubation, would transition to comfort.  They are in agreement with transitioning to COMFORT MEASURES only.  DNR/DNI, nurse may pronounce.  Message sent to Palliative Care of change in status and plan.  PLAN: -COMFORT MEASURES -Will start morphine drip -Prn Ativan -Prn Glycopyrrolate       Harlon Ditty, AGACNP-BC [facility] Prefer epic messenger for cross cover needs If after hours, please call E-link

## 2021-01-10 NOTE — Progress Notes (Signed)
Pt extubated per MD order and placed on 4Lnc

## 2021-01-10 NOTE — Discharge Summary (Signed)
Physician Death Summary  Patient ID: Ryan Mayer MRN: [id number] DOB/AGE: 1955-06-07 66 y.o.  Admit date: 01/06/2021 Discharge date: 12/21/2020  Admission/Discharge Information:  Admit Date: 01/06/21 Date of Death: January 15, 2021 Time of Death: 18:40 Length of Stay:10 days  Reason(s) for Hospitalization:  Acute hypoxic respiratory failure Aspiration pneumonia with Serratia Demand cardiac ischemia Hyponatremia Acute metabolic encephalopathy Epilepsy Developmental delay with hyperactive delirium New diagnosis of diastolic congestive heart failure, not in exacerbation   Diagnoses:  Preliminary Cause of Death: Acute Hypoxic Respiratory Failure Secondary Diagnoses (including complications and co-morbidities) Aspiration pneumonia with Serratia Demand cardiac ischemia Hyponatremia Acute metabolic encephalopathy Epilepsy Developmental delay with hyperactive delirium New diagnosis of diastolic congestive heart failure, not in exacerbation                                                            Brief Hospital Course (including significant findings, care, treatments, and  Events leading to death):   66 yo with PMH of intellectual disability, agitation, epilepsy admitted with Hypertensive Urgency, severe acute Hyponatremia (? SIADH), agitation, and elevated troponin (demand ischemia vs NSTEMI). Developed Acute Hypoxic Respiratory Failure on 11/30 due to aspiration of bloody secretions leading to asphyxia requiring emergent intubation.  See SIGNIFICANT EVENTS section below for full detailed hospital course.   Significant Events:                 01/07/23: Admitted by Hospitalist 11/30 Developed acute hypoxic respiratory failure due to aspiration, emergently intubated,  12/1 remains on vent, severe resp failure 12/2: Extubated.  However required reintubation shortly after due to concern for aspiration and hypoxia.  Unasyn started.  Plan for bronchoscopy and PICC line  placement 12/3: remains on vent,+aspiration pneumonia 12/5: Tracheal aspirate growing Serratia Marcescens, ABX changed to Ceftriaxone.  Prior to pursing SBT, needs follow up with POA regarding [place] and whether to pursue Trach if pt's fails extubation again 12/6: Increase in vent support from yesterday (50% FiO2, 12 PEEP), CXR with bilateral atelectasis and low lung volumes. Na decreased to 130, plan for Lasix x1 dose.  Met with Otho Perl at bedside, plan for compassionate extubation once all staff members have had a chance to visit 12/7: FiO2 weaned to 40%, weaning PEEP as tolerated.  Difficult to keep sedated. Anticipate one way extubation on 01-16-2023 once all family has had chance to visit Jan 16, 2023: On minimal vent settings, plan for 1 way extubation.  If declines would transition to comfort measures  Significant Diagnostic Studies:  12/10/20: CT Head w/o contrast>>IMPRESSION: No acute abnormality no change from the recent CT. Chronic ischemic changes in the right frontal and right parietal lobe. Chronic microvascular ischemic changes. 12/10/20: Renal US>>IMPRESSION: 1. No evidence of nephrolithiasis or hydronephrosis. Limited evaluation of the left kidney due to bowel gas shadowing. 2.  Urinary bladder is collapsed about the Foley's catheter. 12/11/20: Chest xray>>IMPRESSION: Appropriate positioning of endotracheal tube and orogastric tube. Faint airspace opacities in the right mid to lower lung, possibly developing infection.           Micro Data:   Jan 06, 2021: SARS-CoV-2 and influenza PCR>> negative 12/09/2020: MRSA PCR>> negative 12/11/2020: Tracheal aspirate>>SERRATIA MARCESCENS (Resistant to Cefazolin)  Antimicrobials:   Unasyn 12/2>>12/5 Ceftriaxone 12/5>>  Consults:   PCCM Palliative Care    Vitals:   15-Jan-2021 1600 01-15-21 1700  01/01/21 1800 01/01/2021 1839  BP:      Pulse:      Resp: (!) _0 (!) 0  Temp:      TempSrc:      SpO2:      Weight:      Height:          Discharge Labs:   BMET Recent Labs  Lab 12/15/20 0441 12/16/20 0508 01-01-21 0402  NA 130* 138 138  K 4.2 3.8 3.7  CL 100 104 102  CO2 _1 GLUCOSE 113* 116* 115*  BUN 22 31* 24*  CREATININE 0.58* 0.70 0.53*  CALCIUM 6.8* 7.7* 7.8*    CBC Recent Labs  Lab 12/15/20 0441 12/16/20 0508 01/01/21 0402  HGB 8.4* 9.2* 9.2*  HCT 22.7* 27.3* 27.5*  WBC 5.7 7.9 5.9  PLT 155 206 208    Anti-Coagulation No results for input(s): INR in the last 168 hours.      Procedures/Operations  Endotracheal intubation 11/30>>12/2 Reintubation 12/2>>12/8 PICC line placement 12/2     Signed: Darel Hong, AGACNP-BC [place] epic messenger for cross cover needs If after hours, please call E-link

## 2021-01-10 NOTE — Progress Notes (Signed)
NAME:  Ryan Mayer, MRN:  [id number], DOB:  Dec 30, 1955, LOS: 8 ADMISSION DATE:  12/04/2020, CONSULTATION DATE:  12/09/2020 REFERRING MD:  Dr. Alanda Slim, CHIEF COMPLAINT:  Acute Respiratory Distress   Brief Pt Description / Synopsis:  66 yo with PMH of intellectual disability, agitation, epilepsy admitted with Hypertensive Urgency, severe acute Hyponatremia (? SIADH), agitation, and elevated troponin (demand ischemia vs NSTEMI). Developed Acute Hypoxic Respiratory Failure on 11/30 due to aspiration of bloody secretions leading to asphyxia requiring emergent intubation.  Pertinent  Medical History  Intellectual disability Seizures Hypertension  Micro Data:  12/09/2020: SARS-CoV-2 and influenza PCR>> negative 12/09/2020: MRSA PCR>> negative 12/11/2020: Tracheal aspirate>>SERRATIA MARCESCENS (Resistant to Cefazolin)  Antimicrobials:  Unasyn 12/2>>12/5 Ceftriaxone 12/5>>  Significant Hospital Events: Including procedures, antibiotic start and stop dates in addition to other pertinent events   11/29: Admitted by Hospitalist 11/30 Developed acute hypoxic respiratory failure due to aspiration, emergently intubated,  12/1 remains on vent, severe resp failure 12/2: Extubated.  However required reintubation shortly after due to concern for aspiration and hypoxia.  Unasyn started.  Plan for bronchoscopy and PICC line placement 12/3: remains on vent,+aspiration pneumonia 12/5: Tracheal aspirate growing Serratia Marcescens, ABX changed to Ceftriaxone.  Prior to pursing SBT, needs follow up with POA regarding [place] and whether to pursue Trach if pt's fails extubation again 12/6: Increase in vent support from yesterday (50% FiO2, 12 PEEP), CXR with bilateral atelectasis and low lung volumes. Na decreased to 130, plan for Lasix x1 dose.  Met with [place] Jack at bedside, plan for compassionate extubation once all staff members have had a chance to visit 12/7: FiO2 weaned to 40%, weaning PEEP as tolerated.   Difficult to keep sedated. Anticipate one way extubation on 12/8 once all family has had chance to visit 12/8: On minimal vent settings, plan for 1 way extubation.  If declines would transition to comfort measures  Interim History / Subjective:  -No significant events noted overnight -Afebrile,  requiring 4 mcg Levophed -FiO2 weaned to 30% this morning, PEEP 5 -Difficult to keep sedated -PLAN FOR 1 way EXTUBATION today, if pt does not tolerate/declines will transition to COMFORT MEASURES   Objective   Blood pressure (!) 94/59, pulse 63, temperature 98.8 F (37.1 C), temperature source Oral, resp. rate 16, height 5' (1.524 m), weight 69.4 kg, SpO2 96 %.    Vent Mode: PRVC FiO2 (%):  [30 %-40 %] 30 % Set Rate:  [16 bmp] 16 bmp Vt Set:  [450 mL] 450 mL PEEP:  [5 cmH20] 5 cmH20 Plateau Pressure:  [15 cmH20] 15 cmH20   Intake/Output Summary (Last 24 hours) at 2021/01/14 0959 Last data filed at 01-14-2021 7691 Gross per 24 hour  Intake 2166.66 ml  Output 1950 ml  Net 216.66 ml    Filed Weights   12/15/20 0444 12/16/20 0334 01-14-2021 0438  Weight: 70.5 kg 69.3 kg 69.4 kg    Examination: General: Acutely ill-appearing male, laying in bed, intubated and sedated, no acute distress HENT: Atraumatic, normocephalic, neck supple, no JVD Lungs: Coarse breath sounds bilaterally, synchronous with the vent, even Cardiovascular: Bradycardia, Regular rhythm, S1-S2, no murmurs, rubs, gallops, 2+ distal pulses Abdomen: Soft, nontender, nondistended, no guarding rebound tenderness, bowel sounds positive all 4 Extremities: Normal bulk and tone, no deformities, no edema Neuro: Patient currently sedated (RASS -2), when sedation is lightened patient is extremely agitated and delirious trying to self extubate and get out of bed, pupils unequal (left 5 mm, right 2 mm) GU: Foley catheter  in place  Resolved Hospital Problem list     Assessment & Plan:   Acute Hypoxic Respiratory Failure due to  Aspiration -Extubated 12/11/2020, however required reintubation 12/2 shortly after -Full vent support, implement lung protective strategies -Plateau pressures less than 30 cm H20 -Wean FiO2 & PEEP as tolerated to maintain O2 sats >92% -Follow intermittent Chest X-ray & ABG as needed -Spontaneous Breathing Trials when respiratory parameters met and mental status permits -Implement VAP Bundle -Prn Bronchodilators -Continue Ceftriaxone -PLAN FOR 1 way today,  if pt does not tolerate/declines will transition to COMFORT MEASURES  Hypotension, ? septic shock vs Sedation related Elevated Troponin, demand ischemia vs NSTEMI Hypertensive Urgency (earlier in hospital stay) -Continuous cardiac monitoring -Maintain MAP >65 -Vasopressors as needed to maintain MAP goal ~ currently weaned off -Continue Midodrine -Lactic acid normalized -HS Troponin peaked at 1162 -Echocardiogram 12/10/20: LVEF 2-84%, Grade I diastolic dysfunction, RV systolic function normal -Cardiology following, appreciate input -Continue ASA 81 mg daily -Hold antihypertensives due to requirement for vasopressors  Concern for Aspiration Pneumonia ~ Tracheal Aspirate with Serratia Marcescens -Monitor fever curve -Trend WBC's & Procalcitonin -Follow cultures as above -Continue Ceftriaxone  Hyponatremia, ? SIADH (pt is on Tegretol and multiple antipsychotic medications) ~ RESOLVED -Monitor I&O's / urinary output -Follow BMP -Ensure adequate renal perfusion -Avoid nephrotoxic agents as able -Replace electrolytes as indicated  Acute Metabolic Encephalopathy Intellectual disability/agitation Sedation needs in setting of mechanical ventilation PMHx: Seizure disorder/epilepsy -Maintain a RASS goal of 0 to -1 -Fentanyl and Propofol drips as needed to maintain RASS goal -Avoid sedating medications as able -Daily wake up assessment -CT Head 12/1 negative for acute intracranial abnormality -Continue Tegretol -Prn  Benzodiazepines -Continue scheduled Clonazepam and Oxycodone to assist with decreasing IV infusion requirements      Best Practice (right click and "Reselect all SmartList Selections" daily)   Diet/type: tubefeeds and NPO DVT prophylaxis: Heparin SQ GI prophylaxis: PPI Lines: Right arm PICC line, and it is still needed Foley:  Yes, and it is still needed Code Status: DNR/DNI Last date of multidisciplinary goals of care discussion [January 13, 2021]  Pt's legal guarding Merry Lofty updated at bedside 2021/01/13 along with multiple staff members from his facility.  Labs   CBC: Recent Labs  Lab 12/13/20 0341 12/14/20 0405 12/15/20 0441 12/16/20 0508 01-13-21 0402  WBC 7.6 8.2 5.7 7.9 5.9  HGB 9.6* 9.2* 8.4* 9.2* 9.2*  HCT 28.1* 27.5* 22.7* 27.3* 27.5*  MCV 96.2 98.6 100.9* 98.6 98.9  PLT 181 192 155 206 208     Basic Metabolic Panel: Recent Labs  Lab 12/11/20 0423 12/11/20 1608 12/11/20 2256 12/12/20 0208 12/12/20 1156 12/13/20 0341 12/13/20 1253 12/13/20 1920 12/14/20 0405 12/15/20 0441 12/16/20 0508 2021/01/13 0402  NA 132* 131*   < > 132*   < > 135   < > 138 139 130* 138 138  K 4.2  --   --  4.3  --  4.1  --   --  4.6 4.2 3.8 3.7  CL 108  --   --  104  --  106  --   --  107 100 104 102  CO2 22  --   --  22  --  25  --   --  _0 GLUCOSE 119*  --   --  158*  --  152*  --   --  129* 113* 116* 115*  BUN 15  --   --  15  --  16  --   --  22 22 31* 24*  CREATININE 0.88  --   --  0.81  --  0.77  --   --  0.73 0.58* 0.70 0.53*  CALCIUM 7.7*  --   --  7.7*  --  7.5*  --   --  7.8* 6.8* 7.7* 7.8*  MG  --   --   --  2.1  --  2.1  --   --  2.0  --   --   --   PHOS 2.5 2.4*  --  3.5  --  3.0  --   --  3.4  --   --   --    < > = values in this interval not displayed.    GFR: Estimated Creatinine Clearance: 77.3 mL/min (A) (by C-G formula based on SCr of 0.53 mg/dL (L)). Recent Labs  Lab 12/14/20 0405 12/15/20 0441 12/16/20 0508 01-01-2021 0402  WBC 8.2 5.7 7.9  5.9     Liver Function Tests: Recent Labs  Lab 12/11/20 0441  AST 43*  ALT 22  ALKPHOS 57  BILITOT 0.3  PROT 5.5*  ALBUMIN 2.7*    No results for input(s): LIPASE, AMYLASE in the last 168 hours. No results for input(s): AMMONIA in the last 168 hours.  ABG    Component Value Date/Time   PHART 7.36 12/09/2020 1835   PCO2ART 42 12/09/2020 1835   PO2ART 136 (H) 12/09/2020 1835   HCO3 23.7 12/09/2020 1835   ACIDBASEDEF 1.7 12/09/2020 1835   O2SAT 99.0 12/09/2020 1835      Coagulation Profile: No results for input(s): INR, PROTIME in the last 168 hours.  Cardiac Enzymes: No results for input(s): CKTOTAL, CKMB, CKMBINDEX, TROPONINI in the last 168 hours.   HbA1C: No results found for: HGBA1C  CBG: Recent Labs  Lab 12/16/20 1918 12/16/20 2354 01/01/2021 0409 2021/01/01 0410 January 01, 2021 0747  GLUCAP 99 97 317* 104* 107*     Review of Systems:   Unable to assess due to Intubation & sedation   Past Medical History:  He,  has a past medical history of Hypertension, Intellectual disability, and Seizure ([place]).   Surgical History:  History reviewed. No pertinent surgical history.   Social History:   reports that he has never smoked. He has never used smokeless tobacco. He reports that he does not drink alcohol and does not use drugs.   Family History:  His family history is not on file.   Allergies No Known Allergies   Home Medications  Prior to Admission medications   Medication Sig Start Date End Date Taking? Authorizing Provider  acetaZOLAMIDE ER (DIAMOX) 500 MG capsule Take 500 mg by mouth every other day. 12/07/20  Yes [provider]  amLODipine (NORVASC) 5 MG tablet Take 5 mg by mouth daily. 09/10/19  Yes [provider]  aspirin EC 81 MG tablet Take 81 mg by mouth daily. Swallow whole.   Yes [provider]  atropine 1 % ophthalmic solution Place 1 drop into the left eye 6 (six) times daily.   Yes [provider]   brimonidine (ALPHAGAN) 0.2 % ophthalmic solution Place 1 drop into the left eye 3 (three) times daily.   Yes [provider]  carbamazepine (TEGRETOL) 200 MG tablet Take 200 mg by mouth 3 (three) times daily. 09/10/19  Yes [provider]  carvedilol (COREG) 25 MG tablet Take 25 mg by mouth 2 (two) times daily with a meal.   Yes [provider]  cetirizine (ZYRTEC) 10  MG tablet Take 10 mg by mouth 2 (two) times daily.   Yes [provider]  cloNIDine (CATAPRES) 0.1 MG tablet Take 0.1 mg by mouth 3 (three) times daily. 0700, NOON, 1800   Yes [provider]  docusate sodium (COLACE) 100 MG capsule Take 100 mg by mouth 3 (three) times daily as needed for mild constipation or moderate constipation.   Yes [provider]  dorzolamide-timolol (COSOPT) 22.3-6.8 MG/ML ophthalmic solution Place 1 drop into the left eye 2 (two) times daily.   Yes [provider]  Emollient (CETAPHIL) cream Apply 1 application topically as directed. Apply after bath   Yes [provider]  escitalopram (LEXAPRO) 10 MG tablet Take 10 mg by mouth daily. 09/10/19  Yes [provider]  fesoterodine (TOVIAZ) 8 MG TB24 tablet Take 8 mg by mouth daily.   Yes [provider]  fluticasone (FLONASE) 50 MCG/ACT nasal spray Place 1 spray into both nostrils 2 (two) times daily. 09/19/19  Yes [provider]  lisinopril (ZESTRIL) 20 MG tablet Take 20 mg by mouth daily. 11/07/20  Yes [provider]  lisinopril-hydrochlorothiazide (ZESTORETIC) 20-12.5 MG tablet Take 1 tablet by mouth daily. 12/07/20  Yes [provider]  LORazepam (ATIVAN) 1 MG tablet Take 1 mg by mouth in the morning and at bedtime. (1700 & 2000)   Yes [provider]  pantoprazole (PROTONIX) 40 MG tablet Take 40 mg by mouth daily. 09/10/19  Yes [provider]  prednisoLONE acetate (PRED FORTE) 1 % ophthalmic suspension Place 1 drop into the  left eye 6 (six) times daily.   Yes [provider]  risperiDONE (RISPERDAL) 0.5 MG tablet Take 1 mg by mouth See admin instructions. Take 2 tablets (1 mg) by mouth every morning and 2 tablets (1 mg) at bedtime   Yes [provider]  acetaminophen (TYLENOL) 325 MG tablet Take 650 mg by mouth every 4 (four) hours as needed for fever.    [provider]  bisacodyl (DULCOLAX) 5 MG EC tablet Take 10 mg by mouth every 12 (twelve) hours as needed for severe constipation.    [provider]  camphor-menthol Timoteo Ace) lotion Apply 1 application topically as needed for itching.    [provider]  carbamide peroxide (DEBROX) 6.5 % OTIC solution Place 5-10 drops into both ears every Friday.    [provider]  chlorhexidine (PERIDEX) 0.12 % solution Use as directed 5 mLs in the mouth or throat 2 (two) times daily.    [provider]  diphenhydrAMINE (BENADRYL) 25 mg capsule Take 25 mg by mouth every 6 (six) hours as needed.    [provider]  guaiFENesin (ROBITUSSIN) 100 MG/5ML SOLN Take 5 mLs by mouth every 4 (four) hours as needed for cough.    [provider]  hydrOXYzine (ATARAX/VISTARIL) 50 MG tablet Take 50 mg by mouth 3 (three) times daily as needed for itching or anxiety.    [provider]  latanoprost (XALATAN) 0.005 % ophthalmic solution Place 1 drop into the left eye at bedtime.    [provider]  levocetirizine (XYZAL) 5 MG tablet Take 5 mg by mouth at bedtime.  09/10/19   [provider]  loperamide (IMODIUM) 2 MG capsule Take 2-4 mg by mouth See admin instructions. Take 2 capsules (48m) by mouth after first loose stool then take 1 capsule (217m by mouth after each additional loose stool (PRN)    [provider]  LORazepam (ATIVAN) 1 MG  tablet Take 1 mg by mouth daily as needed for anxiety or sedation.    [provider]  metroNIDAZOLE (METROCREAM) 0.75 % cream Apply 1  application topically at bedtime. (Apply to face)    [provider]  naltrexone (DEPADE) 50 MG tablet Take 50 mg by mouth daily. Patient not taking: Reported on 12/09/2020    [provider]  neomycin-bacitracin-polymyxin (NEOSPORIN) ointment Apply 1 application topically daily as needed for wound care.    [provider]  OLANZapine (ZYPREXA) 10 MG tablet Take 10 mg by mouth at bedtime. 12/07/2020   [provider]  polyethylene glycol (MIRALAX / GLYCOLAX) 17 g packet Take 17 g by mouth daily.    [provider]  potassium chloride SA (KLOR-CON) 20 MEQ tablet Take 1 tablet (20 mEq total) by mouth daily. Patient not taking: Reported on 12/09/2020 10/02/20   Edwin Dada, MD  tolnaftate (TINACTIN) 1 % cream Apply 1 application topically 2 (two) times daily as needed (itchy feet).    [provider]  traZODone (DESYREL) 50 MG tablet Take 50-125 mg by mouth 3 (three) times daily. Take one 50 mg tablet by mouth every morning, one 50 mg tablet at 2 PM, and two and one-half tablets 125 mg at bedtime    [provider]     Critical care time: 40 minutes     Darel Hong, AGACNP-BC [facility] epic messenger for cross cover needs If after hours, please call E-link

## 2021-01-10 NOTE — Progress Notes (Signed)
Pt 1 way extubated today. Placed on 4L [place].   Morphine gtt started.   Ativan, Haldol, and Robinul given mx times.   Pt increasingly difficult to get comfortable.   Tasha NP changed orders this evening regarding Morphine gtt/boluses and ativan pushes.   Morphine gtt titrated up to 20 this evening d/t increased air hunger.   Robinul given x2. Increased oral secretions post extubation.   Haldol given x2.   Ativan given x3.   Caregivers @ bedside.   Foley + Flexi remain in place.

## 2021-01-10 DEATH — deceased

## 2022-12-06 IMAGING — CT CT HEAD W/O CM
3 series · 16 of 47 positions shown, 19 images · non-contrast
Comparison: Chest CT dated 07/15/2020.

CLINICAL DATA: Trauma.  Altered mental status.

EXAM:
CT HEAD WITHOUT CONTRAST
TECHNIQUE: Contiguous axial images were obtained from the base of the skull
through the vertex without intravenous contrast.

[Series 3: head wo · axial · 0.40mm/px · z∈[+382,+507]mm · 10 of 30 slices shown, 13 images]
[im 3/30  brain]
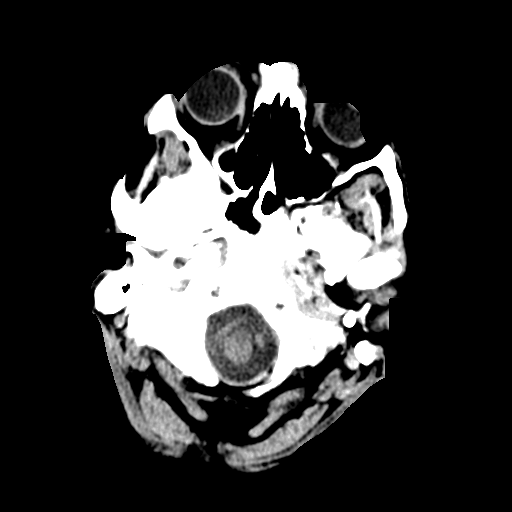
[im 3/30  bone]
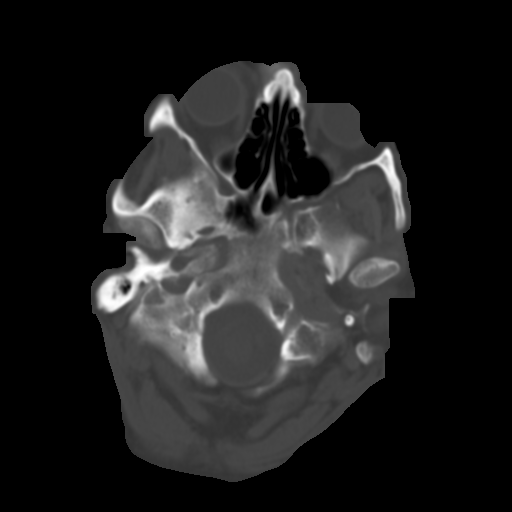
[im 6/30  brain]
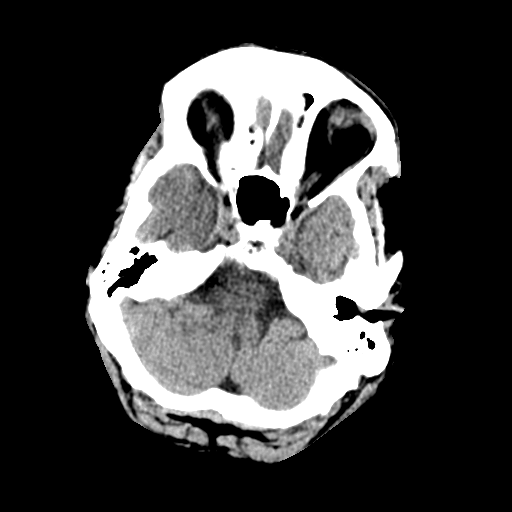
[im 9/30  brain]
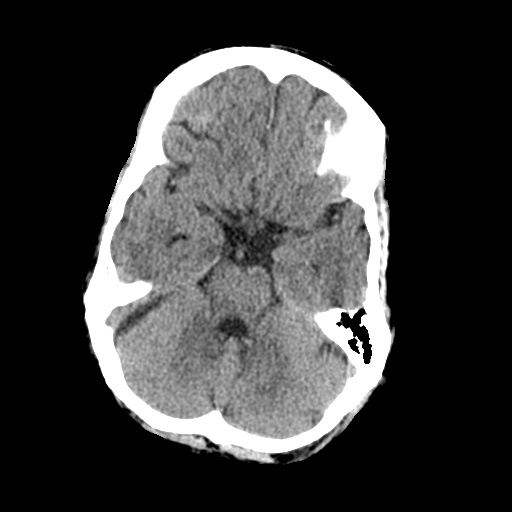
[im 11/30  brain]
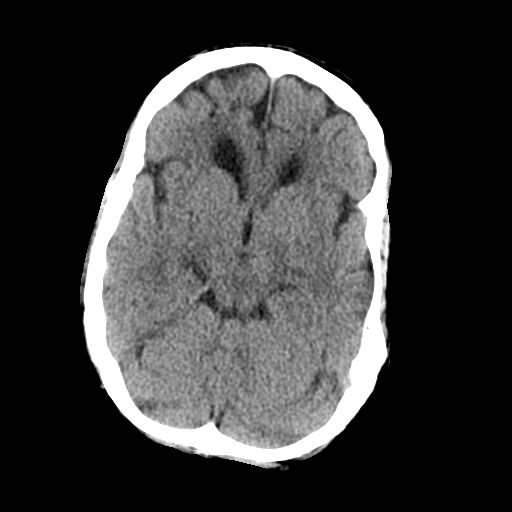
[im 14/30  brain]
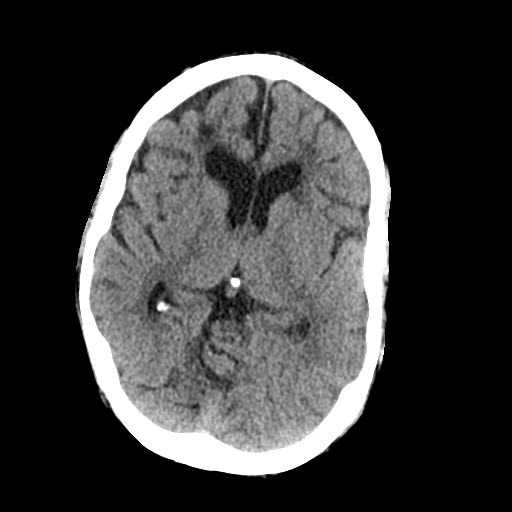
[im 14/30  bone]
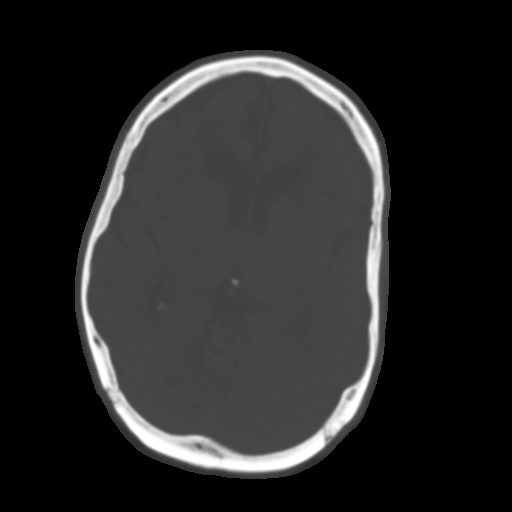
[im 17/30  brain]
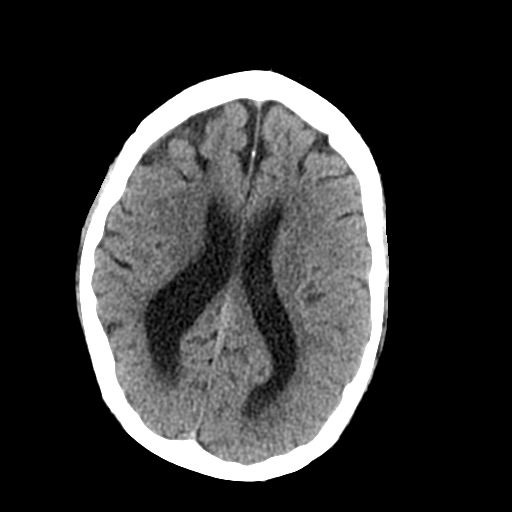
[im 20/30  brain]
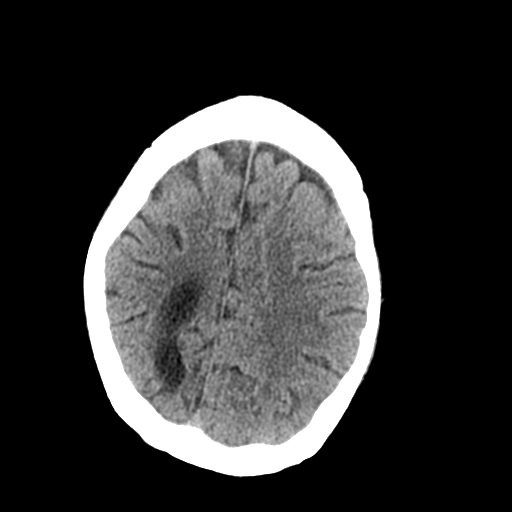
[im 23/30  brain]
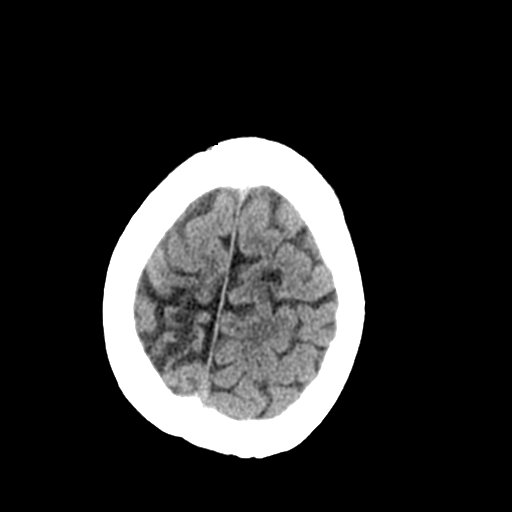
[im 25/30  brain]
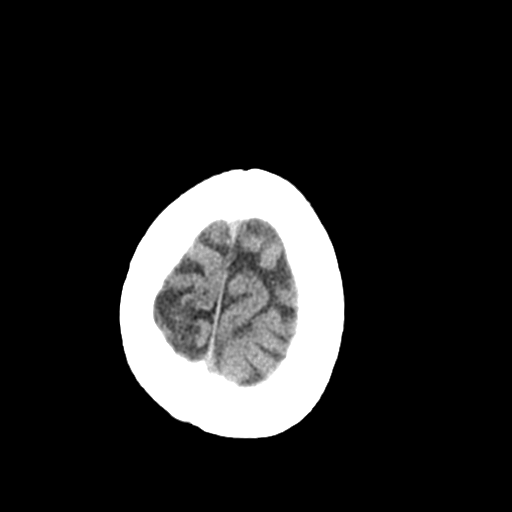
[im 25/30  bone]
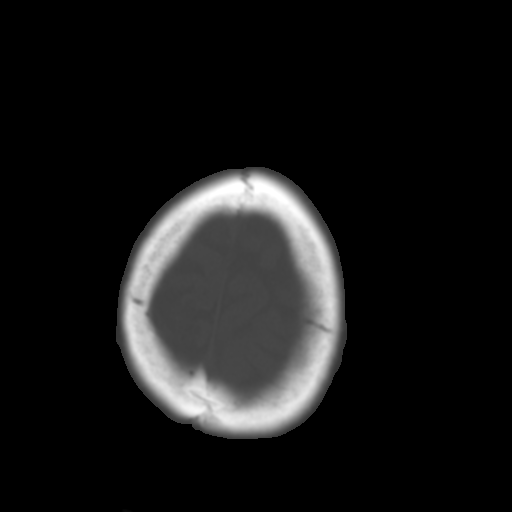
[im 28/30  brain]
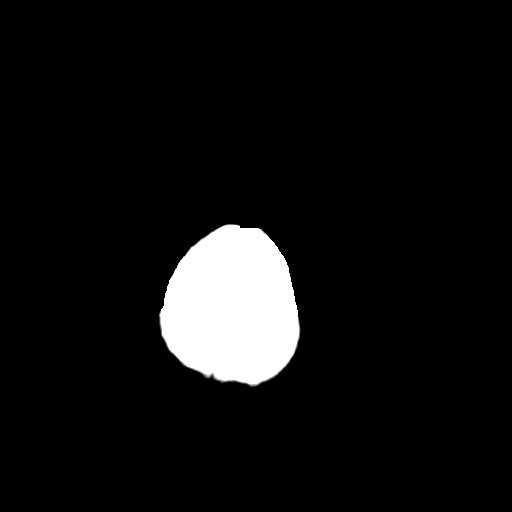

[Series 4: coronal soft tissue · coronal · 0.31mm/px · 3 of 61 slices shown]
[im 21/61  brain]
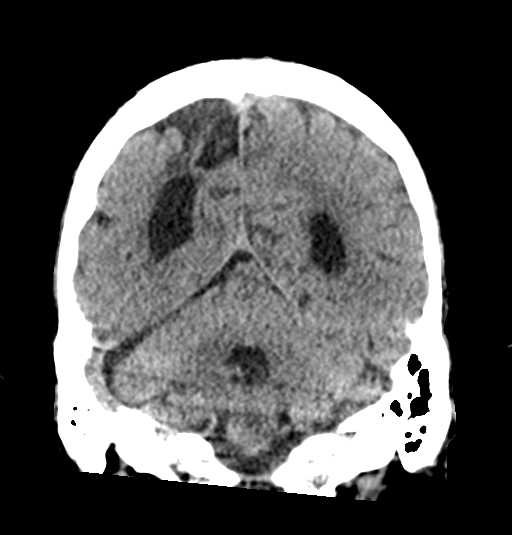
[im 27/61  brain]
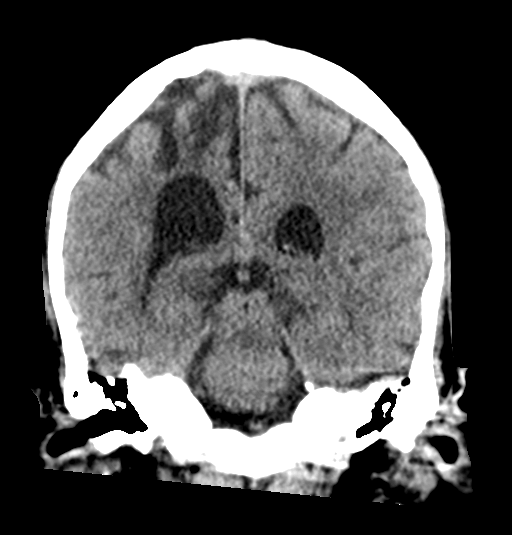
[im 34/61  brain]
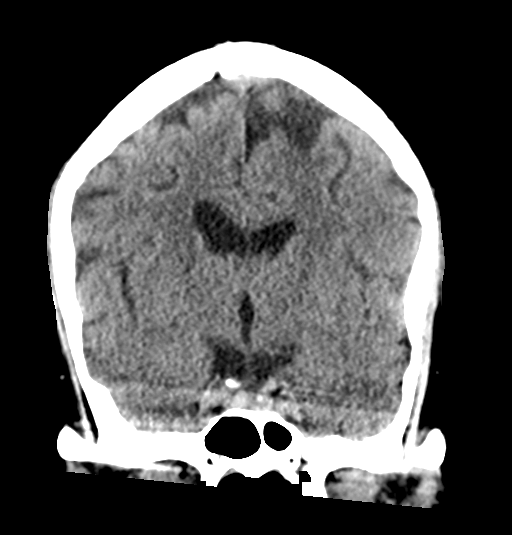

[Series 5: sagittal soft tissue · sagittal · 0.32mm/px · 3 of 48 slices shown]
[im 16/48  brain]
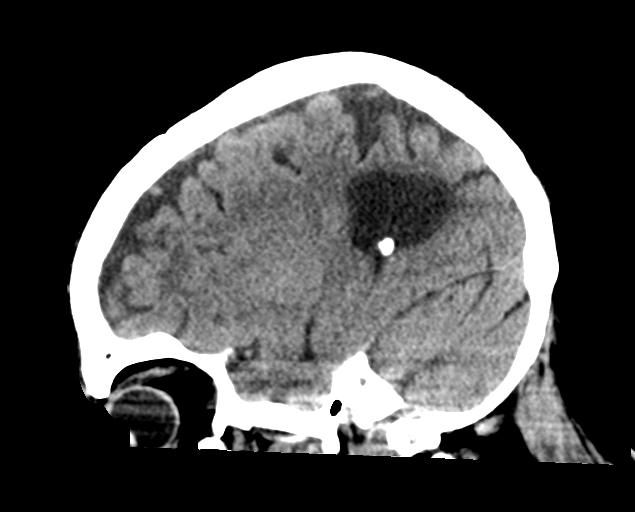
[im 24/48  brain]
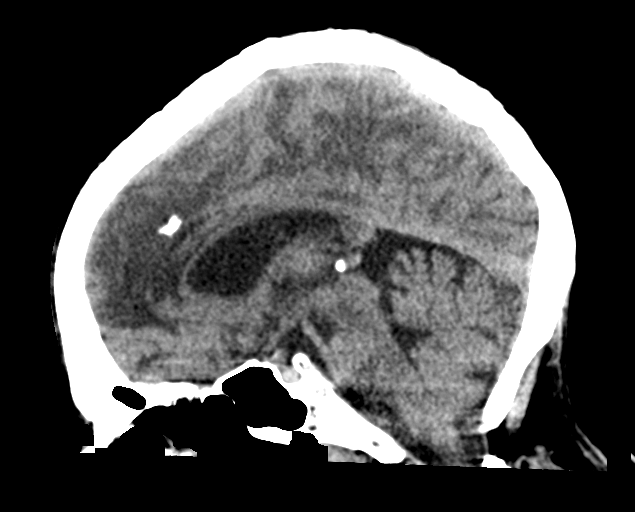
[im 32/48  brain]
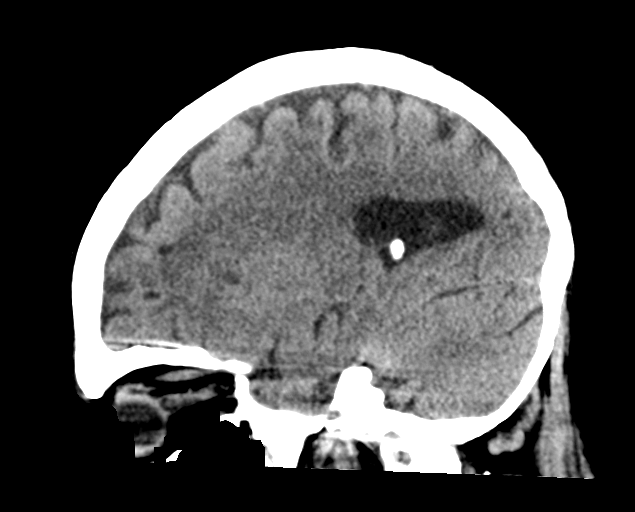

[16 of 47 positions shown; findings below may reference images not displayed]

FINDINGS: Brain: Mild age-related atrophy chronic microvascular ischemic
changes. Areas of old infarct involving the right parietal
convexity. There is no acute intracranial hemorrhage. Mass effect or
midline shift. No extra-axial fluid collection.

Vascular: No hyperdense vessel or unexpected calcification.

Skull: Normal. Negative for fracture or focal lesion.

Sinuses/Orbits: No acute finding.

Other: None
IMPRESSION: 1. No acute intracranial pathology.
2. Mild age-related atrophy chronic microvascular ischemic changes.
Old right parietal convexity infarct.
